# Patient Record
Sex: Male | Born: 1950
Health system: Southern US, Community
[De-identification: ages and names within clinical notes are randomized; demographics above are authoritative.]

## PROBLEM LIST (undated history)

## (undated) DIAGNOSIS — I10 Essential (primary) hypertension: Secondary | ICD-10-CM

## (undated) DIAGNOSIS — E119 Type 2 diabetes mellitus without complications: Secondary | ICD-10-CM

## (undated) DIAGNOSIS — N529 Male erectile dysfunction, unspecified: Secondary | ICD-10-CM

## (undated) DIAGNOSIS — E785 Hyperlipidemia, unspecified: Secondary | ICD-10-CM

## (undated) HISTORY — DX: Essential (primary) hypertension: I10

## (undated) HISTORY — DX: Type 2 diabetes mellitus without complications: E11.9

## (undated) HISTORY — DX: Male erectile dysfunction, unspecified: N52.9

## (undated) HISTORY — DX: Hyperlipidemia, unspecified: E78.5

## (undated) HISTORY — PX: TOOTH EXTRACTION: SHX859

---

## 1997-04-13 HISTORY — PX: APPENDECTOMY: SHX54

## 2014-01-05 DIAGNOSIS — R079 Chest pain, unspecified: Secondary | ICD-10-CM | POA: Insufficient documentation

## 2014-09-25 ENCOUNTER — Ambulatory Visit: Payer: Self-pay

## 2014-10-02 ENCOUNTER — Other Ambulatory Visit: Payer: Self-pay | Admitting: Family Medicine

## 2014-10-02 NOTE — Telephone Encounter (Signed)
Routing to provider  

## 2014-10-02 NOTE — Telephone Encounter (Signed)
E-Fax came through for refill: Rx: Lisinopril Rx copy in basket

## 2014-10-03 MED ORDER — LISINOPRIL 20 MG PO TABS: 40.0000 mg | ORAL_TABLET | Freq: Every day | ORAL | Status: DC

## 2015-03-11 DIAGNOSIS — E1159 Type 2 diabetes mellitus with other circulatory complications: Secondary | ICD-10-CM | POA: Insufficient documentation

## 2015-03-11 DIAGNOSIS — I10 Essential (primary) hypertension: Secondary | ICD-10-CM | POA: Insufficient documentation

## 2015-03-11 DIAGNOSIS — I152 Hypertension secondary to endocrine disorders: Secondary | ICD-10-CM | POA: Insufficient documentation

## 2015-03-11 DIAGNOSIS — E785 Hyperlipidemia, unspecified: Secondary | ICD-10-CM | POA: Insufficient documentation

## 2015-03-11 DIAGNOSIS — E119 Type 2 diabetes mellitus without complications: Secondary | ICD-10-CM | POA: Insufficient documentation

## 2015-03-11 DIAGNOSIS — N529 Male erectile dysfunction, unspecified: Secondary | ICD-10-CM | POA: Insufficient documentation

## 2015-03-14 ENCOUNTER — Ambulatory Visit: Payer: Self-pay | Admitting: Family Medicine

## 2015-03-21 ENCOUNTER — Other Ambulatory Visit: Payer: Self-pay | Admitting: Family Medicine

## 2015-03-21 NOTE — Telephone Encounter (Signed)
rx

## 2015-03-22 MED ORDER — METFORMIN HCL 500 MG PO TABS: 500.0000 mg | ORAL_TABLET | Freq: Every day | ORAL | Status: DC

## 2015-03-22 MED ORDER — AMLODIPINE BESYLATE 5 MG PO TABS: 5.0000 mg | ORAL_TABLET | Freq: Every day | ORAL | Status: DC

## 2015-03-22 NOTE — Telephone Encounter (Signed)
Patient of Dr. Marlise EvesLada's. Patient needs refills on meds, he did schedule an appt.

## 2015-03-22 NOTE — Telephone Encounter (Signed)
Please let Lawrence Jensen know that I'd like to see patient for an appointment here in the office for:  Diabetes, high cholesterol 30 minute visit please Please schedule a visit with me  in the next: month Fasting?  YES, please Thank you, Dr. Sherie DonLada I am sending Rx as requested

## 2015-03-22 NOTE — Telephone Encounter (Signed)
Pt called stated he is completely out of his Amlodopine. Pharm is [facility] on [address] Can someone please send this today. Pt stated he was under the impression the medications would be written for a full year and he needs the RX for the medications written through March. Thanks.

## 2015-04-03 ENCOUNTER — Other Ambulatory Visit: Payer: Self-pay

## 2015-04-03 ENCOUNTER — Ambulatory Visit (INDEPENDENT_AMBULATORY_CARE_PROVIDER_SITE_OTHER): Payer: Self-pay | Admitting: Family Medicine

## 2015-04-03 ENCOUNTER — Encounter: Payer: Self-pay | Admitting: Family Medicine

## 2015-04-03 VITALS — BP 133/78 | HR 74 | Temp 97.9°F | Ht 65.0 in | Wt 180.0 lb

## 2015-04-03 DIAGNOSIS — E785 Hyperlipidemia, unspecified: Secondary | ICD-10-CM

## 2015-04-03 DIAGNOSIS — E119 Type 2 diabetes mellitus without complications: Secondary | ICD-10-CM

## 2015-04-03 DIAGNOSIS — Z5181 Encounter for therapeutic drug level monitoring: Secondary | ICD-10-CM

## 2015-04-03 DIAGNOSIS — I10 Essential (primary) hypertension: Secondary | ICD-10-CM

## 2015-04-03 DIAGNOSIS — Z125 Encounter for screening for malignant neoplasm of prostate: Secondary | ICD-10-CM | POA: Insufficient documentation

## 2015-04-03 NOTE — Assessment & Plan Note (Addendum)
He absolutely refuses statins; I finally talked him into getting lipid panel and starting zetia if needed

## 2015-04-03 NOTE — Patient Instructions (Addendum)
Your goal blood pressure is less than 140 mmHg on top. Try to follow the DASH guidelines (DASH stands for Dietary Approaches to Stop Hypertension) Try to limit the sodium in your diet.  Ideally, consume less than 1.5 grams (less than 1,500mg ) per day. Do not add salt when cooking or at the table.  Check the sodium amount on labels when shopping, and choose items lower in sodium when given a choice. Avoid or limit foods that already contain a lot of sodium. Eat a diet rich in fruits and vegetables and whole grains. Check your feet every day and let me know about any problems Please do see the eye doctor every year  DASH Eating Plan DASH stands for "Dietary Approaches to Stop Hypertension." The DASH eating plan is a healthy eating plan that has been shown to reduce high blood pressure (hypertension). Additional health benefits may include reducing the risk of type 2 diabetes mellitus, heart disease, and stroke. The DASH eating plan may also help with weight loss. WHAT DO I NEED TO KNOW ABOUT THE DASH EATING PLAN? For the DASH eating plan, you will follow these general guidelines:  Choose foods with a percent daily value for sodium of less than 5% (as listed on the food label).  Use salt-free seasonings or herbs instead of table salt or sea salt.  Check with your health care provider or pharmacist before using salt substitutes.  Eat lower-sodium products, often labeled as "lower sodium" or "no salt added."  Eat fresh foods.  Eat more vegetables, fruits, and low-fat dairy products.  Choose whole grains. Look for the word "whole" as the first word in the ingredient list.  Choose fish and skinless chicken or [place] more often than red meat. Limit fish, poultry, and meat to 6 oz (170 g) each day.  Limit sweets, desserts, sugars, and sugary drinks.  Choose heart-healthy fats.  Limit cheese to 1 oz (28 g) per day.  Eat more home-cooked food and less restaurant, buffet, and fast  food.  Limit fried foods.  Cook foods using methods other than frying.  Limit canned vegetables. If you do use them, rinse them well to decrease the sodium.  When eating at a restaurant, ask that your food be prepared with less salt, or no salt if possible. WHAT FOODS CAN I EAT? Seek help from a dietitian for individual calorie needs. Grains Whole grain or whole wheat bread. Brown rice. Whole grain or whole wheat pasta. Quinoa, bulgur, and whole grain cereals. Low-sodium cereals. Corn or whole wheat flour tortillas. Whole grain cornbread. Whole grain crackers. Low-sodium crackers. Vegetables Fresh or frozen vegetables (raw, steamed, roasted, or grilled). Low-sodium or reduced-sodium tomato and vegetable juices. Low-sodium or reduced-sodium tomato sauce and paste. Low-sodium or reduced-sodium canned vegetables.  Fruits All fresh, canned (in natural juice), or frozen fruits. Meat and Other Protein Products Ground beef (85% or leaner), grass-fed beef, or beef trimmed of fat. Skinless chicken or [place]. Ground chicken or [place]. Pork trimmed of fat. All fish and seafood. Eggs. Dried beans, peas, or lentils. Unsalted nuts and seeds. Unsalted canned beans. Dairy Low-fat dairy products, such as skim or 1% milk, 2% or reduced-fat cheeses, low-fat ricotta or cottage cheese, or plain low-fat yogurt. Low-sodium or reduced-sodium cheeses. Fats and Oils Tub margarines without trans fats. Light or reduced-fat mayonnaise and salad dressings (reduced sodium). Avocado. Safflower, olive, or canola oils. Natural peanut or almond butter. Other Unsalted popcorn and pretzels. The items listed above may not be a complete list  of recommended foods or beverages. Contact your dietitian for more options. WHAT FOODS ARE NOT RECOMMENDED? Grains White bread. White pasta. White rice. Refined cornbread. Bagels and croissants. Crackers that contain trans fat. Vegetables Creamed or fried vegetables. Vegetables in a  cheese sauce. Regular canned vegetables. Regular canned tomato sauce and paste. Regular tomato and vegetable juices. Fruits Dried fruits. Canned fruit in light or heavy syrup. Fruit juice. Meat and Other Protein Products Fatty cuts of meat. Ribs, chicken wings, bacon, sausage, bologna, salami, chitterlings, fatback, hot dogs, bratwurst, and packaged luncheon meats. Salted nuts and seeds. Canned beans with salt. Dairy Whole or 2% milk, cream, half-and-half, and cream cheese. Whole-fat or sweetened yogurt. Full-fat cheeses or blue cheese. Nondairy creamers and whipped toppings. Processed cheese, cheese spreads, or cheese curds. Condiments Onion and garlic salt, seasoned salt, table salt, and sea salt. Canned and packaged gravies. Worcestershire sauce. Tartar sauce. Barbecue sauce. Teriyaki sauce. Soy sauce, including reduced sodium. Steak sauce. Fish sauce. Oyster sauce. Cocktail sauce. Horseradish. Ketchup and mustard. Meat flavorings and tenderizers. Bouillon cubes. Hot sauce. Tabasco sauce. Marinades. Taco seasonings. Relishes. Fats and Oils Butter, stick margarine, lard, shortening, ghee, and bacon fat. Coconut, palm kernel, or palm oils. Regular salad dressings. Other Pickles and olives. Salted popcorn and pretzels. The items listed above may not be a complete list of foods and beverages to avoid. Contact your dietitian for more information. WHERE CAN I FIND MORE INFORMATION? National Heart, Lung, and Blood Institute: [employer]   This information is not intended to replace advice given to you by your health care provider. Make sure you discuss any questions you have with your health care provider.   Document Released: 03/19/2011 Document Revised: 04/20/2014 Document Reviewed: 02/01/2013 Elsevier Interactive Patient Education 2016 Elsevier Inc. Cholesterol Cholesterol is a fat. Your body needs a small amount of cholesterol. Cholesterol may build up  in your blood vessels. This increases your chance of having a heart attack or stroke. You cannot feel your cholesterol levels. The only way to know your cholesterol level is high is with a blood test. Keep your test results. Work with your doctor to keep your cholesterol at a good level. WHAT DO THE TEST RESULTS MEAN?  Total cholesterol is how much cholesterol is in your blood.  LDL is bad cholesterol. This is the type that can build up. You want LDL to be low.  HDL is good cholesterol. It cleans your blood vessels and carries LDL away. You want HDL to be high.  Triglycerides are fat that the body can burn for energy or store. WHAT ARE GOOD LEVELS OF CHOLESTEROL?  Total cholesterol below 200.  LDL below 100 for people at risk. Below 70 for those at very high risk.  HDL above 50 is good. Above 60 is best.  Triglycerides below 150. HOW CAN I LOWER MY CHOLESTEROL?  Diet. Follow your diet programs as told by your doctor.  Choose fish, white meat chicken, roasted [place], or baked [place]. Try not to eat red meat, fried foods, or processed meats such as sausage and lunch meats.  Eat lots of fresh fruits and vegetables.  Choose whole grains, beans, pasta, potatoes, and cereals.  Use only small amounts of olive, corn, or canola oils.  Try not to eat butter, mayonnaise, shortening, or palm kernel oils.  Try not to eat foods with trans fats.  Drink skim or nonfat milk. Eat low-fat or nonfat yogurt and cheeses. Try not to drink whole milk or cream. Try not to  eat ice cream, egg yolks, and full-fat cheeses.  Healthy desserts include angel food cake, ginger snaps, animal crackers, hard candy, popsicles, and low-fat or nonfat frozen yogurt. Try not to eat pastries, cakes, pies, and cookies.  Exercise. Follow your exercise programs as told by your doctor.  Be more active. You can try gardening, walking, or taking the stairs. Ask your doctor about how you can be more active.  Medicine.  Take medicine as told by your doctor.   This information is not intended to replace advice given to you by your health care provider. Make sure you discuss any questions you have with your health care provider.   Document Released: 06/26/2008 Document Revised: 04/20/2014 Document Reviewed: 01/11/2013 Elsevier Interactive Patient Education [employer]

## 2015-04-03 NOTE — Assessment & Plan Note (Signed)
BP at goal, systolic less than 140; continue CCB and ACE-I; try DASH guidelines

## 2015-04-03 NOTE — Assessment & Plan Note (Signed)
Check creatinine 

## 2015-04-03 NOTE — Progress Notes (Signed)
BP 133/78 mmHg  Pulse 74  Temp(Src) 97.9 F (36.6 C)  Ht  (1.651 m)  Wt 180 lb (81.647 kg)  BMI 29.95 kg/m2  SpO2 98%   Subjective:    Patient ID: Lawrence Jensen, male    DOB: 10-May-1950, 64 y.o.   MRN: [id number]  HPI: Lawrence Jensen is a 64 y.o. male  Chief Complaint  Patient presents with  . Hypertension    he's just here for a follow up, he does not want any labs done  . Hyperlipidemia  . 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 His father had prostate cancer and he saw what he went through and doesn't want to go through that. Interim medical history since our last visit reviewed. Allergies and medications reviewed and updated.  Review of Systems  Constitutional: Negative for unexpected weight change (he weighs 1-2 x a week, not losing weight unexpectedly).  Cardiovascular: Negative for leg swelling.  Endocrine: Negative for polydipsia and polyuria.  Genitourinary: Negative for difficulty urinating.  Neurological: Negative for numbness.   Per HPI unless specifically indicated above     Objective:    BP 133/78 mmHg  Pulse 74  Temp(Src) 97.9 F (36.6 C)  Ht  (1.651 m)  Wt 180 lb (81.647 kg)  BMI 29.95 kg/m2  SpO2 98%  Wt Readings from Last 3 Encounters:  04/03/15 180 lb (81.647 kg)  08/23/14 187 lb (84.823 kg)    Physical Exam  Constitutional: He appears well-developed and well-nourished. No distress.  HENT:  Head:  Normocephalic and atraumatic.  Eyes: EOM are normal. No scleral icterus.  Neck: No thyromegaly present.  Cardiovascular: Normal rate and regular rhythm.   Pulmonary/Chest: Effort normal and breath sounds normal.  Abdominal: Soft. Bowel sounds are normal. He exhibits no distension.  Musculoskeletal: He exhibits no edema.  Neurological: Coordination normal.  Skin: Skin is warm and dry. No pallor.  Psychiatric: He has a normal mood and affect. His behavior is normal. Judgment and thought content normal.   Diabetic Foot Form - Detailed   Diabetic Foot Exam - detailed  Diabetic Foot exam was performed with the following findings:  Yes 04/03/2015  9:51 AM  Visual Foot Exam completed.:  Yes  Are the toenails long?:  No  Are the toenails thick?:  No  Is there foot or ankle muscle weakness?:  No  Are the toenails ingrown?:  No  Normal Range of Motion:  Yes    Pulse Foot Exam completed.:  Yes  Right Dorsalis Pedis:  Present Left Dorsalis Pedis:  Present  Sensory Foot Exam Completed.:  Yes  Semmes-Weinstein Monofilament Test  R Site 1-Great Toe:  Pos L Site 1-Great Toe:  Pos  R Site 4:  Pos L Site 4:  Pos   L Site 5:  Pos          Assessment & Plan:   Problem List  Items Addressed This Visit      Cardiovascular and Mediastinum   Hypertension - Primary    BP at goal, systolic less than 140; continue CCB and ACE-I; try DASH guidelines        Endocrine   Diabetes mellitus without complication (HCC)    I finally talked patient into the A1c and glucose; aspirin, ACE-I; he refuses statins completely      Relevant Orders   Hgb A1c w/o eAG   Microalbumin / creatinine urine ratio     Other   Hyperlipidemia    He absolutely refuses statins; I finally talked him into getting lipid panel and starting zetia if needed      Relevant Orders   Lipid Panel w/o Chol/HDL Ratio   Medication monitoring encounter    Check creatinine      Relevant Orders   Comprehensive metabolic panel       Follow up plan: Return in about 6 months (around 10/02/2015) for 30 minutes, fasting, diabetes, cholesterol, BP.   Patient declined psa, offered today  An after-visit summary was printed and given to the patient at check-out.  Please see the patient instructions which may contain other information and recommendations beyond what is mentioned above in the assessment and plan. Orders Placed This Encounter  Procedures  . Hgb A1c w/o eAG  . Lipid Panel w/o Chol/HDL Ratio  . Comprehensive metabolic panel  . Microalbumin / creatinine urine ratio

## 2015-04-03 NOTE — Assessment & Plan Note (Signed)
I finally talked patient into the A1c and glucose; aspirin, ACE-I; he refuses statins completely

## 2015-04-03 NOTE — Telephone Encounter (Signed)
He needs refills on his meds

## 2015-04-04 LAB — COMPREHENSIVE METABOLIC PANEL
ALBUMIN: 4.5 g/dL (ref 3.6–4.8)
ALT: 17 IU/L (ref 0–44)
AST: 20 IU/L (ref 0–40)
Albumin/Globulin Ratio: 1.7 (ref 1.1–2.5)
Alkaline Phosphatase: 86 IU/L (ref 39–117)
BUN / CREAT RATIO: 12 (ref 10–22)
BUN: 13 mg/dL (ref 8–27)
Bilirubin Total: 0.6 mg/dL (ref 0.0–1.2)
CO2: 27 mmol/L (ref 18–29)
CREATININE: 1.07 mg/dL (ref 0.76–1.27)
Calcium: 10.1 mg/dL (ref 8.6–10.2)
Chloride: 99 mmol/L (ref 96–106)
GFR calc non Af Amer: 73 mL/min/{1.73_m2} (ref >59)
GFR, EST AFRICAN AMERICAN: 85 mL/min/{1.73_m2} (ref >59)
GLUCOSE: 137 mg/dL — AB (ref 65–99)
Globulin, Total: 2.6 g/dL (ref 1.5–4.5)
Potassium: 4.6 mmol/L (ref 3.5–5.2)
Sodium: 139 mmol/L (ref 134–144)
TOTAL PROTEIN: 7.1 g/dL (ref 6.0–8.5)

## 2015-04-04 LAB — LIPID PANEL W/O CHOL/HDL RATIO
Cholesterol, Total: 192 mg/dL (ref 100–199)
HDL: 35 mg/dL — ABNORMAL LOW (ref >39)
LDL CALC: 115 mg/dL — AB (ref 0–99)
Triglycerides: 211 mg/dL — ABNORMAL HIGH (ref 0–149)
VLDL CHOLESTEROL CAL: 42 mg/dL — AB (ref 5–40)

## 2015-04-04 LAB — MICROALBUMIN / CREATININE URINE RATIO
CREATININE, UR: 53.9 mg/dL
Microalbumin, Urine: <3 ug/mL

## 2015-04-04 LAB — HGB A1C W/O EAG: Hgb A1c MFr Bld: 6.3 % — ABNORMAL HIGH (ref 4.8–5.6)

## 2015-04-04 MED ORDER — EZETIMIBE 10 MG PO TABS: 10.0000 mg | ORAL_TABLET | Freq: Every day | ORAL | Status: DC

## 2015-04-04 MED ORDER — LISINOPRIL 20 MG PO TABS: 40.0000 mg | ORAL_TABLET | Freq: Every day | ORAL | Status: DC

## 2015-04-04 MED ORDER — METFORMIN HCL 500 MG PO TABS: 500.0000 mg | ORAL_TABLET | Freq: Every day | ORAL | Status: DC

## 2015-04-04 MED ORDER — AMLODIPINE BESYLATE 5 MG PO TABS: 5.0000 mg | ORAL_TABLET | Freq: Every day | ORAL | Status: DC

## 2015-04-04 NOTE — Telephone Encounter (Signed)
Labs reviewed; Rxs approved and zetia started; new, he refuses statin

## 2015-05-29 ENCOUNTER — Other Ambulatory Visit: Payer: Self-pay | Admitting: Family Medicine

## 2015-05-29 NOTE — Telephone Encounter (Signed)
Dec 2016 labs reviewed; Rxs approved

## 2015-06-26 ENCOUNTER — Telehealth: Payer: Self-pay | Admitting: Family Medicine

## 2015-06-26 NOTE — Telephone Encounter (Signed)
Spoke with pharmacy, they do have his rx.

## 2015-06-26 NOTE — Telephone Encounter (Signed)
Pt called stated pharmacy has not received an RX for his Lisinopril. Pharm is [facility] on [address]. Thanks.

## 2015-06-26 NOTE — Telephone Encounter (Signed)
Left message on patient's identified voicemail that the pharmacy does have his rx.

## 2015-07-01 ENCOUNTER — Ambulatory Visit: Payer: Self-pay | Admitting: Unknown Physician Specialty

## 2015-07-01 ENCOUNTER — Encounter: Payer: Self-pay | Admitting: Unknown Physician Specialty

## 2015-07-01 VITALS — BP 141/86 | HR 68 | Temp 98.7°F | Ht 64.7 in | Wt 182.4 lb

## 2015-07-01 NOTE — Progress Notes (Signed)
BP 141/86 mmHg  Pulse 68  Temp(Src) 98.7 F (37.1 C)  Ht 5' 4.7" (1.643 m)  Wt 182 lb 6.4 oz (82.736 kg)  BMI 30.65 kg/m2  SpO2 98%   Subjective:    Patient ID: Lawrence Jensen, male    DOB: December 25, 1950, 65 y.o.   MRN: [id number]  HPI: Lawrence PickDavid Clark Drummer is a 65 y.o. male  Chief Complaint  Patient presents with  . Employment Physical    DOT Physical   Reviewed chart.  Last Hgb A1C was 6.3.  Compliant with chronic disease follow up with Dr. Sherie DonLada.  -  Relevant past medical, surgical, family and social history reviewed and updated as indicated. Interim medical history since our last visit reviewed. Allergies and medications reviewed and updated.  Review of Systems  Per HPI unless specifically indicated above     Objective:    BP 141/86 mmHg  Pulse 68  Temp(Src) 98.7 F (37.1 C)  Ht 5' 4.7" (1.643 m)  Wt 182 lb 6.4 oz (82.736 kg)  BMI 30.65 kg/m2  SpO2 98%  Wt Readings from Last 3 Encounters:  07/01/15 182 lb 6.4 oz (82.736 kg)  04/03/15 180 lb (81.647 kg)  08/23/14 187 lb (84.823 kg)    Physical Exam  Results for orders placed or performed in visit on 04/03/15  Hgb A1c w/o eAG  Result Value Ref Range   Hgb A1c MFr Bld 6.3 (H) 4.8 - 5.6 %  Lipid Panel w/o Chol/HDL Ratio  Result Value Ref Range   Cholesterol, Total 192 100 - 199 mg/dL   Triglycerides 409211 (H) 0 - 149 mg/dL   HDL 35 (L) >81>39 mg/dL   VLDL Cholesterol Cal 42 (H) 5 - 40 mg/dL   LDL Calculated 191115 (H) 0 - 99 mg/dL  Comprehensive metabolic panel  Result Value Ref Range   Glucose 137 (H) 65 - 99 mg/dL   BUN 13 8 - 27 mg/dL   Creatinine, Ser 4.781.07 0.76 - 1.27 mg/dL   GFR calc non Af Amer 73 >59 mL/min/1.73   GFR calc Af Amer 85 >59 mL/min/1.73   BUN/Creatinine Ratio 12 10 - 22   Sodium 139 134 - 144 mmol/L   Potassium 4.6 3.5 - 5.2 mmol/L   Chloride 99 96 - 106 mmol/L   CO2 27 18 - 29 mmol/L   Calcium 10.1 8.6 - 10.2 mg/dL   Total Protein 7.1 6.0 - 8.5 g/dL   Albumin 4.5 3.6 - 4.8 g/dL    Globulin, Total 2.6 1.5 - 4.5 g/dL   Albumin/Globulin Ratio 1.7 1.1 - 2.5   Bilirubin Total 0.6 0.0 - 1.2 mg/dL   Alkaline Phosphatase 86 39 - 117 IU/L   AST 20 0 - 40 IU/L   ALT 17 0 - 44 IU/L  Microalbumin / creatinine urine ratio  Result Value Ref Range   Creatinine, Urine 53.9 Not Estab. mg/dL   Microalbum.,U,Random <3.0 Not Estab. ug/mL   MICROALB/CREAT RATIO <5.6 0.0 - 30.0 mg/g creat      Assessment & Plan:   Problem List Items Addressed This Visit    None    Visit Diagnoses    Routine general medical examination at a health care facility    -  Primary    Relevant Orders    Urinalysis, dipstick only       Pt did not take BP meds today and on recheck BP is higher.  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

## 2015-07-03 ENCOUNTER — Ambulatory Visit (INDEPENDENT_AMBULATORY_CARE_PROVIDER_SITE_OTHER): Payer: Self-pay | Admitting: Unknown Physician Specialty

## 2015-07-03 ENCOUNTER — Encounter: Payer: Self-pay | Admitting: Unknown Physician Specialty

## 2015-07-03 VITALS — BP 130/69 | HR 58 | Temp 99.0°F | Ht 64.7 in | Wt 182.4 lb

## 2015-07-03 DIAGNOSIS — Z0000 Encounter for general adult medical examination without abnormal findings: Secondary | ICD-10-CM

## 2015-07-03 LAB — URINALYSIS, DIPSTICK ONLY
BILIRUBIN UA: NEGATIVE
Glucose, UA: NEGATIVE
KETONES UA: NEGATIVE
NITRITE UA: NEGATIVE
Protein, UA: NEGATIVE
RBC UA: NEGATIVE
SPEC GRAV UA: 1.01 (ref 1.005–1.030)
UUROB: 0.2 mg/dL (ref 0.2–1.0)
pH, UA: 7 (ref 5.0–7.5)

## 2015-07-04 NOTE — Progress Notes (Signed)
   BP 130/69 mmHg  Pulse 58  Temp(Src) 99 F (37.2 C)  Ht 5' 4.7" (1.643 m)  Wt 182 lb 6.4 oz (82.736 kg)  BMI 30.65 kg/m2   Subjective:    Patient ID: Lawrence Jensen, male    DOB: [birth year], 65 y.o.   MRN: [id number]  HPI: Lawrence Jensen is a 65 y.o. male  Chief Complaint  Patient presents with  . Employment Physical    DOT Physical   See form for details Relevant past medical, surgical, family and social history reviewed and updated as indicated. Interim medical history since our last visit reviewed. Allergies and medications reviewed and updated.  Review of Systems  Per HPI unless specifically indicated above     Objective:    BP 130/69 mmHg  Pulse 58  Temp(Src) 99 F (37.2 C)  Ht 5' 4.7" (1.643 m)  Wt 182 lb 6.4 oz (82.736 kg)  BMI 30.65 kg/m2  Wt Readings from Last 3 Encounters:  07/03/15 182 lb 6.4 oz (82.736 kg)  07/01/15 182 lb 6.4 oz (82.736 kg)  04/03/15 180 lb (81.647 kg)    Physical Exam  Constitutional: He is oriented to person, place, and time. He appears well-developed and well-nourished.  HENT:  Head: Normocephalic.  Right Ear: Tympanic membrane, external ear and ear canal normal.  Left Ear: Tympanic membrane, external ear and ear canal normal.  Mouth/Throat: Uvula is midline, oropharynx is clear and moist and mucous membranes are normal.  Eyes: Pupils are equal, round, and reactive to light.  Cardiovascular: Normal rate, regular rhythm and normal heart sounds.  Exam reveals no gallop and no friction rub.   No murmur heard. Pulmonary/Chest: Effort normal and breath sounds normal. No respiratory distress.  Abdominal: Soft. Bowel sounds are normal. He exhibits no distension. There is no tenderness.  Musculoskeletal: Normal range of motion.  Neurological: He is alert and oriented to person, place, and time. He has normal reflexes.  Skin: Skin is warm and dry.  Psychiatric: He has a normal mood and affect. His behavior is normal.  Judgment and thought content normal.    Results for orders placed or performed in visit on 07/03/15  Urinalysis, dipstick only  Result Value Ref Range   Specific Gravity, UA 1.010 1.005 - 1.030   pH, UA 7.0 5.0 - 7.5   Color, UA Yellow Yellow   Appearance Ur Clear Clear   Leukocytes, UA 1+ (A) Negative   Protein, UA Negative Negative/Trace   Glucose, UA Negative Negative   Ketones, UA Negative Negative   RBC, UA Negative Negative   Bilirubin, UA Negative Negative   Urobilinogen, Ur 0.2 0.2 - 1.0 mg/dL   Nitrite, UA Negative Negative      Assessment & Plan:   Problem List Items Addressed This Visit    None    Visit Diagnoses    Routine general medical examination at a health care facility    -  Primary    Relevant Orders    Urinalysis, dipstick only (Completed)        Follow up plan: Return in about 3 months (around 10/03/2015).

## 2015-08-07 ENCOUNTER — Encounter: Payer: Self-pay | Admitting: Unknown Physician Specialty

## 2015-08-19 ENCOUNTER — Telehealth: Payer: Self-pay

## 2015-08-19 NOTE — Telephone Encounter (Signed)
07/01/15 should have been a no charge visit.  I don't think I was managing his refills at the time.  Is there something I can do to help with that?

## 2015-08-19 NOTE — Telephone Encounter (Signed)
Routing to provider  

## 2015-08-19 NOTE — Telephone Encounter (Signed)
Patient called to advise that he is receiving a bill for his DOT physical although he paid for the physical.  It appears that patient was inadvertently charged for a DOT on 07/01/15 and 07/03/15.  Pt also expressed displeasure with the way his RX refills were handled.  I apologized to the patient for his experience and explained that I would send a message to the provider requesting permission to w/o the charge for DOS 07/03/15. Please review and advise.

## 2015-10-02 ENCOUNTER — Ambulatory Visit: Payer: Self-pay | Admitting: Family Medicine

## 2015-10-29 ENCOUNTER — Ambulatory Visit: Payer: Self-pay | Admitting: Unknown Physician Specialty

## 2015-11-01 ENCOUNTER — Encounter: Payer: Self-pay | Admitting: Unknown Physician Specialty

## 2015-11-01 ENCOUNTER — Ambulatory Visit (INDEPENDENT_AMBULATORY_CARE_PROVIDER_SITE_OTHER): Payer: Self-pay | Admitting: Unknown Physician Specialty

## 2015-11-01 VITALS — BP 125/68 | HR 57 | Temp 97.8°F | Ht 65.2 in | Wt 180.2 lb

## 2015-11-01 DIAGNOSIS — E119 Type 2 diabetes mellitus without complications: Secondary | ICD-10-CM

## 2015-11-01 DIAGNOSIS — E785 Hyperlipidemia, unspecified: Secondary | ICD-10-CM

## 2015-11-01 DIAGNOSIS — I10 Essential (primary) hypertension: Secondary | ICD-10-CM

## 2015-11-01 LAB — LIPID PANEL PICCOLO, WAIVED
CHOL/HDL RATIO PICCOLO,WAIVE: 4.6 mg/dL
Cholesterol Piccolo, Waived: 205 mg/dL — ABNORMAL HIGH (ref <200)
HDL Chol Piccolo, Waived: 45 mg/dL — ABNORMAL LOW (ref >59)
LDL Chol Calc Piccolo Waived: 128 mg/dL — ABNORMAL HIGH (ref <100)
TRIGLYCERIDES PICCOLO,WAIVED: 160 mg/dL — AB (ref <150)
VLDL CHOL CALC PICCOLO,WAIVE: 32 mg/dL — AB (ref <30)

## 2015-11-01 LAB — BAYER DCA HB A1C WAIVED: HB A1C: 6.4 % (ref <7.0)

## 2015-11-01 MED ORDER — LISINOPRIL 20 MG PO TABS: 40.0000 mg | ORAL_TABLET | Freq: Every day | ORAL | Status: DC

## 2015-11-01 MED ORDER — AMLODIPINE BESYLATE 5 MG PO TABS: 5.0000 mg | ORAL_TABLET | Freq: Every day | ORAL | Status: DC

## 2015-11-01 MED ORDER — METFORMIN HCL 500 MG PO TABS: 500.0000 mg | ORAL_TABLET | Freq: Every day | ORAL | Status: DC

## 2015-11-01 NOTE — Patient Instructions (Addendum)

## 2015-11-01 NOTE — Progress Notes (Signed)
BP 125/68 mmHg  Pulse 57  Temp(Src) 97.8 F (36.6 C)  Ht 5' 5.2" (1.656 m)  Wt 180 lb 3.2 oz (81.738 kg)  BMI 29.81 kg/m2  SpO2 96%   Subjective:    Patient ID: Lawrence Jensen, male    DOB: 20-Sep-1950, 65 y.o.   MRN: [id number]  HPI: Lawrence Jensen is a 65 y.o. male  Chief Complaint  Patient presents with  . Diabetes    pt states he has not had an eye exam in a few years   . Hypertension   Diabetes: Diet controlled and taking Metformin without problems Feet problems:None Blood Sugars averaging: Does not check eye exam within last year Last Hgb A1C: 6.3  Hypertension  Using medications without difficulty Average home BPs Not checking  Using medication without problems or lightheadedness No chest pain with exertion or shortness of breath No Edema  Elevated Cholesterol Using medications without problems No Muscle aches  Diet: regular Exercise: work   Relevant past medical, surgical, family and social history reviewed and updated as indicated. Interim medical history since our last visit reviewed. Allergies and medications reviewed and updated.  Review of Systems  Per HPI unless specifically indicated above     Objective:    BP 125/68 mmHg  Pulse 57  Temp(Src) 97.8 F (36.6 C)  Ht 5' 5.2" (1.656 m)  Wt 180 lb 3.2 oz (81.738 kg)  BMI 29.81 kg/m2  SpO2 96%  Wt Readings from Last 3 Encounters:  11/01/15 180 lb 3.2 oz (81.738 kg)  07/03/15 182 lb 6.4 oz (82.736 kg)  07/01/15 182 lb 6.4 oz (82.736 kg)    Physical Exam  Constitutional: He is oriented to person, place, and time. He appears well-developed and well-nourished. No distress.  HENT:  Head: Normocephalic and atraumatic.  Eyes: Conjunctivae and lids are normal. Right eye exhibits no discharge. Left eye exhibits no discharge. No scleral icterus.  Neck: Normal range of motion. Neck supple. No JVD present. Carotid bruit is not present.  Cardiovascular: Normal rate, regular rhythm and  normal heart sounds.   Pulmonary/Chest: Effort normal and breath sounds normal. No respiratory distress.  Abdominal: Normal appearance. There is no splenomegaly or hepatomegaly.  Musculoskeletal: Normal range of motion.  Neurological: He is alert and oriented to person, place, and time.  Skin: Skin is warm, dry and intact. No rash noted. No pallor.  Psychiatric: He has a normal mood and affect. His behavior is normal. Judgment and thought content normal.      Assessment & Plan:   Problem List Items Addressed This Visit      Unprioritized   Diabetes mellitus without complication (HCC) - Primary    Hgb A1C is 6.4      Relevant Medications   metFORMIN (GLUCOPHAGE) 500 MG tablet   lisinopril (PRINIVIL,ZESTRIL) 20 MG tablet   Other Relevant Orders   Comprehensive metabolic panel   Bayer DCA Hb W4XA1c Waived   Hyperlipidemia    LDL is 128.  Statin intolerant.  Continue diet.  Refusing Zetia      Relevant Medications   lisinopril (PRINIVIL,ZESTRIL) 20 MG tablet   amLODipine (NORVASC) 5 MG tablet   Other Relevant Orders   Lipid Panel Piccolo, Waived   Hypertension    Stable, continue present medications.        Relevant Medications   lisinopril (PRINIVIL,ZESTRIL) 20 MG tablet   amLODipine (NORVASC) 5 MG tablet   Other Relevant Orders   Comprehensive metabolic panel  Follow up plan: Return in about 6 months (around 05/03/2016).

## 2015-11-01 NOTE — Assessment & Plan Note (Signed)
LDL is 128.  Statin intolerant.  Continue diet.  Refusing Zetia

## 2015-11-01 NOTE — Assessment & Plan Note (Signed)
Hgb A1C is 6.4

## 2015-11-01 NOTE — Assessment & Plan Note (Signed)
Stable, continue present medications.   

## 2015-11-02 LAB — COMPREHENSIVE METABOLIC PANEL
ALBUMIN: 4.4 g/dL (ref 3.6–4.8)
ALK PHOS: 89 IU/L (ref 39–117)
ALT: 19 IU/L (ref 0–44)
AST: 18 IU/L (ref 0–40)
Albumin/Globulin Ratio: 1.6 (ref 1.2–2.2)
BILIRUBIN TOTAL: 0.6 mg/dL (ref 0.0–1.2)
BUN/Creatinine Ratio: 16 (ref 10–24)
BUN: 22 mg/dL (ref 8–27)
CHLORIDE: 97 mmol/L (ref 96–106)
CO2: 22 mmol/L (ref 18–29)
CREATININE: 1.35 mg/dL — AB (ref 0.76–1.27)
Calcium: 10 mg/dL (ref 8.6–10.2)
GFR calc non Af Amer: 55 mL/min/{1.73_m2} — ABNORMAL LOW (ref >59)
GFR, EST AFRICAN AMERICAN: 64 mL/min/{1.73_m2} (ref >59)
GLUCOSE: 146 mg/dL — AB (ref 65–99)
Globulin, Total: 2.7 g/dL (ref 1.5–4.5)
Potassium: 4.9 mmol/L (ref 3.5–5.2)
Sodium: 138 mmol/L (ref 134–144)
TOTAL PROTEIN: 7.1 g/dL (ref 6.0–8.5)

## 2015-11-04 ENCOUNTER — Encounter: Payer: Self-pay | Admitting: Unknown Physician Specialty

## 2016-03-16 ENCOUNTER — Telehealth: Payer: Self-pay | Admitting: Unknown Physician Specialty

## 2016-03-16 NOTE — Telephone Encounter (Signed)
Pt called stated he has questions about possibly increasing the dosage of his Metformin. Pt stated his blood sugar is creeping up. Stated his blood sugar is over 170 even with taking 2 500 mg Metformin per day. Please call pt back ASAP. Thanks.

## 2016-03-17 NOTE — Telephone Encounter (Signed)
Called and spoke to patient. He states that in the past we has taking 2 tablets of metformin per day but has doing well with that so he was taken down to 1 tablet per day. Patient states this was working for him for a while but his BS has recently been climbing up around 150 to 170 so he started taking 2 tablets of metformin again. Patient states he did splurge a little at Thanksgiving and his BS went up to 190 but has come back down and stayed around 170 since then. Patient wants to know if he needs to take more than 2 tablets per day or what he should do. I let the patient know that Elnita MaxwellCheryl was out of the office today and that she would get back to me tomorrow.

## 2016-03-18 NOTE — Telephone Encounter (Signed)
I would keep at 2 tablets for now

## 2016-03-18 NOTE — Telephone Encounter (Signed)
Called and let patient know what [name] said.  

## 2016-05-04 ENCOUNTER — Encounter: Payer: Self-pay | Admitting: Unknown Physician Specialty

## 2016-05-04 ENCOUNTER — Ambulatory Visit (INDEPENDENT_AMBULATORY_CARE_PROVIDER_SITE_OTHER): Payer: Self-pay | Admitting: Unknown Physician Specialty

## 2016-05-04 VITALS — BP 128/77 | HR 61 | Temp 97.5°F | Ht 66.2 in | Wt 182.8 lb

## 2016-05-04 DIAGNOSIS — E782 Mixed hyperlipidemia: Secondary | ICD-10-CM

## 2016-05-04 DIAGNOSIS — E119 Type 2 diabetes mellitus without complications: Secondary | ICD-10-CM

## 2016-05-04 DIAGNOSIS — I10 Essential (primary) hypertension: Secondary | ICD-10-CM

## 2016-05-04 LAB — BAYER DCA HB A1C WAIVED: HB A1C (BAYER DCA - WAIVED): 6.1 % (ref <7.0)

## 2016-05-04 MED ORDER — METFORMIN HCL 500 MG PO TABS: 500.0000 mg | ORAL_TABLET | Freq: Two times a day (BID) | ORAL | 1 refills | Status: DC

## 2016-05-04 MED ORDER — AMLODIPINE BESYLATE 5 MG PO TABS: 5.0000 mg | ORAL_TABLET | Freq: Every day | ORAL | 1 refills | Status: DC

## 2016-05-04 MED ORDER — LISINOPRIL 20 MG PO TABS: 40.0000 mg | ORAL_TABLET | Freq: Every day | ORAL | 1 refills | Status: DC

## 2016-05-04 NOTE — Progress Notes (Signed)
BP 128/77 (BP Location: Left Arm, Patient Position: Sitting, Cuff Size: Large)   Pulse 61   Temp 97.5 F (36.4 C)   Ht 5' 6.2" (1.681 m) Comment: pt had boots on  Wt 182 lb 12.8 oz (82.9 kg) Comment: pt had boots on  SpO2 97%   BMI 29.33 kg/m    Subjective:    Patient ID: Lawrence Jensen, male    DOB: 1950-12-12, 66 y.o.   MRN: [id number]  HPI: Lawrence Jensen is a 66 y.o. male   Chief Complaint  Patient presents with  . Hyperlipidemia  . Hypertension  . Diabetes    pt states it has been a while since he had an eye exam   Hypertension Taking medications without difficulty. Reports that he checks his blood pressure at home a couple of times a week, with measurements usually in the 120s/70-80 mmHg. He notes that he has not been exercising recently due to the cold weather. Enquired about potentially stopping the amlodipine due to his current measurements.   Diabetes Taking medications without difficulty. Reports that he attempts to keep his blood glucose levels below 150 mg/dL and checks them 3-4 times a week with measurements usually in the 120s. Notes that his measurements started to go up around Thanksgiving and he was having difficulty lowering them, so he started taking his metformin twice a day instead of once a day and has not had trouble since this time. Admits that he was not eating as well around the holidays, but has been better since the new year started.  Hyperlipidemia Patient refuses statin therapy.   Relevant past medical, surgical, family and social history reviewed and updated as indicated. Interim medical history since our last visit reviewed. Allergies and medications reviewed and updated.  Review of Systems  Constitutional: Negative for appetite change, fatigue and unexpected weight change.  HENT: Negative.   Eyes: Negative for pain, redness and visual disturbance.  Respiratory: Negative for cough, chest tightness and wheezing.   Cardiovascular:  Negative for chest pain, palpitations and leg swelling.  Gastrointestinal: Negative for abdominal pain, constipation, diarrhea and nausea.  Endocrine: Negative for polydipsia and polyuria.  Genitourinary: Negative for difficulty urinating, dysuria and frequency.  Musculoskeletal: Negative.   Skin: Negative.   Allergic/Immunologic: Negative.   Neurological: Negative for dizziness, light-headedness, numbness and headaches.  Psychiatric/Behavioral: Negative.     Per HPI unless specifically indicated above     Objective:    BP 128/77 (BP Location: Left Arm, Patient Position: Sitting, Cuff Size: Large)   Pulse 61   Temp 97.5 F (36.4 C)   Ht 5' 6.2" (1.681 m) Comment: pt had boots on  Wt 182 lb 12.8 oz (82.9 kg) Comment: pt had boots on  SpO2 97%   BMI 29.33 kg/m   Wt Readings from Last 3 Encounters:  05/04/16 182 lb 12.8 oz (82.9 kg)  11/01/15 180 lb 3.2 oz (81.7 kg)  07/03/15 182 lb 6.4 oz (82.7 kg)    Physical Exam  Constitutional: He is oriented to person, place, and time. He appears well-developed and well-nourished. No distress.  HENT:  Head: Normocephalic.  Eyes: Conjunctivae are normal. Right eye exhibits no discharge. Left eye exhibits no discharge. No scleral icterus.  Cardiovascular: Normal rate, regular rhythm, normal heart sounds and intact distal pulses.  Exam reveals no gallop and no friction rub.   No murmur heard. Pulmonary/Chest: Effort normal and breath sounds normal. No respiratory distress. He has no wheezes.  Neurological: He is  alert and oriented to person, place, and time.  Skin: Skin is warm and dry. No rash noted. He is not diaphoretic.  Psychiatric: He has a normal mood and affect. His behavior is normal. Thought content normal.  Vitals reviewed.   Results for orders placed or performed in visit on 11/01/15  Comprehensive metabolic panel  Result Value Ref Range   Glucose 146 (H) 65 - 99 mg/dL   BUN 22 8 - 27 mg/dL   Creatinine, Ser 1.61 (H) 0.76  - 1.27 mg/dL   GFR calc non Af Amer 55 (L) >59 mL/min/1.73   GFR calc Af Amer 64 >59 mL/min/1.73   BUN/Creatinine Ratio 16 10 - 24   Sodium 138 134 - 144 mmol/L   Potassium 4.9 3.5 - 5.2 mmol/L   Chloride 97 96 - 106 mmol/L   CO2 22 18 - 29 mmol/L   Calcium 10.0 8.6 - 10.2 mg/dL   Total Protein 7.1 6.0 - 8.5 g/dL   Albumin 4.4 3.6 - 4.8 g/dL   Globulin, Total 2.7 1.5 - 4.5 g/dL   Albumin/Globulin Ratio 1.6 1.2 - 2.2   Bilirubin Total 0.6 0.0 - 1.2 mg/dL   Alkaline Phosphatase 89 39 - 117 IU/L   AST 18 0 - 40 IU/L   ALT 19 0 - 44 IU/L  Lipid Panel Piccolo, Waived  Result Value Ref Range   Cholesterol Piccolo, Waived 205 (H) <200 mg/dL   HDL Chol Piccolo, Waived 45 (L) >59 mg/dL   Triglycerides Piccolo,Waived 160 (H) <150 mg/dL   Chol/HDL Ratio Piccolo,Waive 4.6 mg/dL   LDL Chol Calc Piccolo Waived 128 (H) <100 mg/dL   VLDL Chol Calc Piccolo,Waive 32 (H) <30 mg/dL  Bayer DCA Hb W9U Waived  Result Value Ref Range   Bayer DCA Hb A1c Waived 6.4 <7.0 %      Assessment & Plan:   Problem List Items Addressed This Visit      Unprioritized   Diabetes mellitus without complication (HCC) - Primary    Stable. Patient will continue taking metformin500 mg twice daily.      Relevant Medications   metFORMIN (GLUCOPHAGE) 500 MG tablet   lisinopril (PRINIVIL,ZESTRIL) 20 MG tablet   Other Relevant Orders   Comprehensive metabolic panel   Bayer DCA Hb E4V Waived   Hyperlipidemia    Patient refuses statin therapy.      Relevant Medications   amLODipine (NORVASC) 5 MG tablet   lisinopril (PRINIVIL,ZESTRIL) 20 MG tablet   Other Relevant Orders   Lipid Panel w/o Chol/HDL Ratio   Hypertension    Stable. Patient wants to attempt going off amlodipine. He will monitor his blood pressure measurements regularly during this time and if his blood pressure begins to rise he will resume amlodipine.       Relevant Medications   amLODipine (NORVASC) 5 MG tablet   lisinopril (PRINIVIL,ZESTRIL)  20 MG tablet       Follow up plan: Return in about 6 months (around 11/01/2016), or if symptoms worsen or fail to improve, for physical.

## 2016-05-04 NOTE — Assessment & Plan Note (Addendum)
Stable. Patient wants to attempt going off amlodipine. He will monitor his blood pressure measurements regularly during this time and if his blood pressure begins to rise he will resume amlodipine.

## 2016-05-04 NOTE — Assessment & Plan Note (Signed)
Stable. Patient will continue taking metformin500 mg twice daily.

## 2016-05-04 NOTE — Assessment & Plan Note (Signed)
Patient refuses statin therapy

## 2016-05-05 LAB — COMPREHENSIVE METABOLIC PANEL
A/G RATIO: 2.2 (ref 1.2–2.2)
ALBUMIN: 4.8 g/dL (ref 3.6–4.8)
ALK PHOS: 121 IU/L — AB (ref 39–117)
ALT: 20 IU/L (ref 0–44)
AST: 18 IU/L (ref 0–40)
BILIRUBIN TOTAL: 0.3 mg/dL (ref 0.0–1.2)
BUN / CREAT RATIO: 19 (ref 10–24)
BUN: 22 mg/dL (ref 8–27)
CHLORIDE: 98 mmol/L (ref 96–106)
CO2: 21 mmol/L (ref 18–29)
Calcium: 9.9 mg/dL (ref 8.6–10.2)
Creatinine, Ser: 1.13 mg/dL (ref 0.76–1.27)
GFR calc non Af Amer: 68 mL/min/{1.73_m2} (ref >59)
GFR, EST AFRICAN AMERICAN: 79 mL/min/{1.73_m2} (ref >59)
GLOBULIN, TOTAL: 2.2 g/dL (ref 1.5–4.5)
Glucose: 130 mg/dL — ABNORMAL HIGH (ref 65–99)
POTASSIUM: 4.7 mmol/L (ref 3.5–5.2)
SODIUM: 135 mmol/L (ref 134–144)
TOTAL PROTEIN: 7 g/dL (ref 6.0–8.5)

## 2016-05-05 LAB — LIPID PANEL W/O CHOL/HDL RATIO
CHOLESTEROL TOTAL: 170 mg/dL (ref 100–199)
HDL: 28 mg/dL — ABNORMAL LOW (ref >39)
LDL Calculated: 93 mg/dL (ref 0–99)
Triglycerides: 246 mg/dL — ABNORMAL HIGH (ref 0–149)
VLDL CHOLESTEROL CAL: 49 mg/dL — AB (ref 5–40)

## 2016-10-21 ENCOUNTER — Other Ambulatory Visit: Payer: Self-pay

## 2016-10-21 MED ORDER — AMLODIPINE BESYLATE 5 MG PO TABS: 5.0000 mg | ORAL_TABLET | Freq: Every day | ORAL | 1 refills | Status: DC

## 2016-10-21 MED ORDER — METFORMIN HCL 500 MG PO TABS: 500.0000 mg | ORAL_TABLET | Freq: Two times a day (BID) | ORAL | 1 refills | Status: DC

## 2016-10-21 MED ORDER — LISINOPRIL 20 MG PO TABS: 40.0000 mg | ORAL_TABLET | Freq: Every day | ORAL | 1 refills | Status: DC

## 2016-10-21 NOTE — Addendum Note (Signed)
Addended by: [name], Jaimarie Rapozo on: 10/21/2016 04:46 PM   Modules accepted: Orders

## 2016-10-21 NOTE — Telephone Encounter (Signed)
Patient had appointment 05/04/16 and has one scheduled for 11/02/16.

## 2016-10-21 NOTE — Telephone Encounter (Signed)
Received refill requests on patient's medication from [facility]. This was not listed in patient's pharmacies so I called and asked the patient about it. He states that he is using [facility] now.

## 2016-10-21 NOTE — Telephone Encounter (Signed)
Patient

## 2016-11-02 ENCOUNTER — Encounter: Payer: Self-pay | Admitting: Unknown Physician Specialty

## 2016-11-02 ENCOUNTER — Ambulatory Visit (INDEPENDENT_AMBULATORY_CARE_PROVIDER_SITE_OTHER): Payer: Medicare Other | Admitting: Unknown Physician Specialty

## 2016-11-02 VITALS — BP 123/68 | HR 53 | Temp 98.1°F | Ht 65.0 in | Wt 182.5 lb

## 2016-11-02 DIAGNOSIS — Z0000 Encounter for general adult medical examination without abnormal findings: Secondary | ICD-10-CM

## 2016-11-02 DIAGNOSIS — E119 Type 2 diabetes mellitus without complications: Secondary | ICD-10-CM | POA: Diagnosis not present

## 2016-11-02 DIAGNOSIS — Z87891 Personal history of nicotine dependence: Secondary | ICD-10-CM | POA: Diagnosis not present

## 2016-11-02 DIAGNOSIS — E782 Mixed hyperlipidemia: Secondary | ICD-10-CM | POA: Diagnosis not present

## 2016-11-02 DIAGNOSIS — I10 Essential (primary) hypertension: Secondary | ICD-10-CM

## 2016-11-02 DIAGNOSIS — Z5181 Encounter for therapeutic drug level monitoring: Secondary | ICD-10-CM

## 2016-11-02 MED ORDER — SIMVASTATIN 20 MG PO TABS: 20.0000 mg | ORAL_TABLET | Freq: Every day | ORAL | 3 refills | Status: DC

## 2016-11-02 NOTE — Assessment & Plan Note (Signed)
Start with a lower dose Simvastatin 20 mg.  ? If 40 mg Atorvastatin was just too high for him

## 2016-11-02 NOTE — Assessment & Plan Note (Signed)
Hgb A1C is 6.3.  Stable, continue present medications.  

## 2016-11-02 NOTE — Addendum Note (Signed)
Addended by: [name], Joachim Carton on: 11/02/2016 11:54 AM   Modules accepted: Orders

## 2016-11-02 NOTE — Assessment & Plan Note (Signed)
Stable, continue present medications.   

## 2016-11-02 NOTE — Progress Notes (Signed)
BP 123/68   Pulse (!) 53   Temp 98.1 F (36.7 C)   Ht 5\' 5"  (1.651 m)   Wt 182 lb 8 oz (82.8 kg)   SpO2 98%   BMI 30.37 kg/m    Subjective:    Patient ID: Lawrence Jensen, male    DOB: February 25, 1951, 66 y.o.   MRN: [id number]  HPI: Lawrence Jensen is a 66 y.o. male  Chief Complaint  Patient presents with  . Medicare Wellness  . Diabetes    pt states last eye exam in chart is correct   Diabetes: Using medications without difficulties No hypoglycemic episodes No hyperglycemic episodes Feet problems: none Blood Sugars averaging: 132 yesterday AM eye exam within last year Last Hgb A1C: 6.1  Hypertension  Using medications without difficulty Average home BPs Not checking   Using medication without problems or lightheadedness No chest pain with exertion or shortness of breath No Edema  Elevated Cholesterol States cholesterol medication gave him cramps in his feet which was disabling.  Review of chart shows started on Atorvastatin 40 mg.   Diet: "not bad" and feels it is decent.   Exercise: Staying busy    Social History   Social History  . Marital status: Married    Spouse name: N/A  . Number of children: N/A  . Years of education: N/A   Occupational History  . Not on file.   Social History Main Topics  . Smoking status: Former Smoker    Packs/day: 1.00    Years: 35.00    Types: Cigarettes    Quit date: 04/03/1995  . Smokeless tobacco: Never Used  . Alcohol use No  . Drug use: No  . Sexual activity: No   Other Topics Concern  . Not on file   Social History Narrative  . No narrative on file   Family History  Problem Relation Age of Onset  . Heart disease Mother   . Heart disease Father   . Congestive Heart Failure Father   . Cancer Father        prostate  . Diabetes Father   . Stroke Father   . Hypertension Father   . Cancer Brother   . Stroke Maternal Grandmother   . Heart disease Brother   . COPD Neg Hx    Past Medical  History:  Diagnosis Date  . Diabetes mellitus without complication (HCC)   . ED (erectile dysfunction)   . Hyperlipidemia   . Hypertension    Past Surgical History:  Procedure Laterality Date  . APPENDECTOMY  1999    Relevant past medical, surgical, family and social history reviewed and updated as indicated. Interim medical history since our last visit reviewed. Allergies and medications reviewed and updated.  Review of Systems  Per HPI unless specifically indicated above     Objective:    BP 123/68   Pulse (!) 53   Temp 98.1 F (36.7 C)   Ht 5\' 5"  (1.651 m)   Wt 182 lb 8 oz (82.8 kg)   SpO2 98%   BMI 30.37 kg/m   Wt Readings from Last 3 Encounters:  11/02/16 182 lb 8 oz (82.8 kg)  05/04/16 182 lb 12.8 oz (82.9 kg)  11/01/15 180 lb 3.2 oz (81.7 kg)    Physical Exam  Constitutional: He is oriented to person, place, and time. He appears well-developed and well-nourished.  HENT:  Head: Normocephalic.  Right Ear: Tympanic membrane, external ear and ear canal normal.  Left  Ear: Tympanic membrane, external ear and ear canal normal.  Mouth/Throat: Uvula is midline, oropharynx is clear and moist and mucous membranes are normal.  Eyes: Pupils are equal, round, and reactive to light.  Cardiovascular: Normal rate, regular rhythm and normal heart sounds.  Exam reveals no gallop and no friction rub.   No murmur heard. Pulmonary/Chest: Effort normal and breath sounds normal. No respiratory distress.  Abdominal: Soft. Bowel sounds are normal. He exhibits no distension. There is no tenderness.  Musculoskeletal: Normal range of motion.  Neurological: He is alert and oriented to person, place, and time. He has normal reflexes.  Skin: Skin is warm and dry.  Psychiatric: He has a normal mood and affect. His behavior is normal. Judgment and thought content normal.   EKG with normal sinus rhythm.  No acute or chronic changes  Results for orders placed or performed in visit on  05/04/16  Comprehensive metabolic panel  Result Value Ref Range   Glucose 130 (H) 65 - 99 mg/dL   BUN 22 8 - 27 mg/dL   Creatinine, Ser 1.61 0.76 - 1.27 mg/dL   GFR calc non Af Amer 68 >59 mL/min/1.73   GFR calc Af Amer 79 >59 mL/min/1.73   BUN/Creatinine Ratio 19 10 - 24   Sodium 135 134 - 144 mmol/L   Potassium 4.7 3.5 - 5.2 mmol/L   Chloride 98 96 - 106 mmol/L   CO2 21 18 - 29 mmol/L   Calcium 9.9 8.6 - 10.2 mg/dL   Total Protein 7.0 6.0 - 8.5 g/dL   Albumin 4.8 3.6 - 4.8 g/dL   Globulin, Total 2.2 1.5 - 4.5 g/dL   Albumin/Globulin Ratio 2.2 1.2 - 2.2   Bilirubin Total 0.3 0.0 - 1.2 mg/dL   Alkaline Phosphatase 121 (H) 39 - 117 IU/L   AST 18 0 - 40 IU/L   ALT 20 0 - 44 IU/L  Lipid Panel w/o Chol/HDL Ratio  Result Value Ref Range   Cholesterol, Total 170 100 - 199 mg/dL   Triglycerides 096 (H) 0 - 149 mg/dL   HDL 28 (L) >04 mg/dL   VLDL Cholesterol Cal 49 (H) 5 - 40 mg/dL   LDL Calculated 93 0 - 99 mg/dL  Bayer DCA Hb V4U Waived  Result Value Ref Range   Bayer DCA Hb A1c Waived 6.1 <7.0 %      Assessment & Plan:   Problem List Items Addressed This Visit      Unprioritized   Diabetes mellitus without complication (HCC)    Hgb A1C is 6.3.  Stable, continue present medications.        Relevant Medications   simvastatin (ZOCOR) 20 MG tablet   Other Relevant Orders   Comprehensive metabolic panel   Bayer DCA Hb J8J Waived   Microalbumin, Urine Waived   Hyperlipidemia    Start with a lower dose Simvastatin 20 mg.  ? If 40 mg Atorvastatin was just too high for him      Relevant Medications   simvastatin (ZOCOR) 20 MG tablet   Other Relevant Orders   Lipid Panel w/o Chol/HDL Ratio   Hypertension - Primary    Stable, continue present medications.        Relevant Medications   simvastatin (ZOCOR) 20 MG tablet   Other Relevant Orders   Comprehensive metabolic panel   Screening for prostate cancer    Other Visit Diagnoses    Initial Medicare annual wellness  visit       Relevant  Orders   EKG 12-Lead (Completed)   CBC with Differential/Platelet   PSA   Annual physical exam           Follow up plan: Return in about 6 months (around 05/05/2017).

## 2016-11-03 ENCOUNTER — Encounter: Payer: Self-pay | Admitting: Unknown Physician Specialty

## 2016-11-03 LAB — LIPID PANEL W/O CHOL/HDL RATIO
Cholesterol, Total: 179 mg/dL (ref 100–199)
HDL: 40 mg/dL (ref >39)
LDL Calculated: 115 mg/dL — ABNORMAL HIGH (ref 0–99)
Triglycerides: 119 mg/dL (ref 0–149)
VLDL Cholesterol Cal: 24 mg/dL (ref 5–40)

## 2016-11-03 LAB — CBC WITH DIFFERENTIAL/PLATELET
BASOS: 1 %
Basophils Absolute: 0 10*3/uL (ref 0.0–0.2)
EOS (ABSOLUTE): 0.1 10*3/uL (ref 0.0–0.4)
Eos: 3 %
Hematocrit: 43 % (ref 37.5–51.0)
Hemoglobin: 14.9 g/dL (ref 13.0–17.7)
Immature Grans (Abs): 0 10*3/uL (ref 0.0–0.1)
Immature Granulocytes: 0 %
Lymphocytes Absolute: 1.3 10*3/uL (ref 0.7–3.1)
Lymphs: 30 %
MCH: 30.5 pg (ref 26.6–33.0)
MCHC: 34.7 g/dL (ref 31.5–35.7)
MCV: 88 fL (ref 79–97)
MONOS ABS: 0.3 10*3/uL (ref 0.1–0.9)
Monocytes: 6 %
NEUTROS ABS: 2.5 10*3/uL (ref 1.4–7.0)
Neutrophils: 60 %
PLATELETS: 197 10*3/uL (ref 150–379)
RBC: 4.89 x10E6/uL (ref 4.14–5.80)
RDW: 13.8 % (ref 12.3–15.4)
WBC: 4.2 10*3/uL (ref 3.4–10.8)

## 2016-11-03 LAB — COMPREHENSIVE METABOLIC PANEL
A/G RATIO: 2.1 (ref 1.2–2.2)
ALT: 11 IU/L (ref 0–44)
AST: 13 IU/L (ref 0–40)
Albumin: 4.7 g/dL (ref 3.6–4.8)
Alkaline Phosphatase: 91 IU/L (ref 39–117)
BILIRUBIN TOTAL: 0.4 mg/dL (ref 0.0–1.2)
BUN/Creatinine Ratio: 19 (ref 10–24)
BUN: 19 mg/dL (ref 8–27)
CHLORIDE: 101 mmol/L (ref 96–106)
CO2: 26 mmol/L (ref 20–29)
Calcium: 9.9 mg/dL (ref 8.6–10.2)
Creatinine, Ser: 1.01 mg/dL (ref 0.76–1.27)
GFR calc non Af Amer: 78 mL/min/{1.73_m2} (ref >59)
GFR, EST AFRICAN AMERICAN: 90 mL/min/{1.73_m2} (ref >59)
Globulin, Total: 2.2 g/dL (ref 1.5–4.5)
Glucose: 129 mg/dL — ABNORMAL HIGH (ref 65–99)
POTASSIUM: 4.5 mmol/L (ref 3.5–5.2)
Sodium: 142 mmol/L (ref 134–144)
TOTAL PROTEIN: 6.9 g/dL (ref 6.0–8.5)

## 2016-11-03 LAB — PSA: Prostate Specific Ag, Serum: 0.4 ng/mL (ref 0.0–4.0)

## 2016-11-03 NOTE — Progress Notes (Signed)
Patient notified

## 2016-11-05 LAB — MICROALBUMIN, URINE WAIVED
Creatinine, Urine Waived: 200 mg/dL (ref 10–300)
Microalb, Ur Waived: 30 mg/L — ABNORMAL HIGH (ref 0–19)
Microalb/Creat Ratio: <30 mg/g (ref <30)

## 2016-11-05 LAB — BAYER DCA HB A1C WAIVED: HB A1C: 6.3 % (ref <7.0)

## 2016-12-25 ENCOUNTER — Other Ambulatory Visit (INDEPENDENT_AMBULATORY_CARE_PROVIDER_SITE_OTHER): Payer: Self-pay

## 2016-12-25 ENCOUNTER — Encounter (INDEPENDENT_AMBULATORY_CARE_PROVIDER_SITE_OTHER): Payer: Self-pay | Admitting: Vascular Surgery

## 2017-02-02 ENCOUNTER — Ambulatory Visit (INDEPENDENT_AMBULATORY_CARE_PROVIDER_SITE_OTHER): Payer: Self-pay

## 2017-02-02 ENCOUNTER — Encounter (INDEPENDENT_AMBULATORY_CARE_PROVIDER_SITE_OTHER): Payer: Self-pay | Admitting: Vascular Surgery

## 2017-02-02 ENCOUNTER — Other Ambulatory Visit (INDEPENDENT_AMBULATORY_CARE_PROVIDER_SITE_OTHER): Payer: Self-pay | Admitting: Vascular Surgery

## 2017-02-02 ENCOUNTER — Ambulatory Visit (INDEPENDENT_AMBULATORY_CARE_PROVIDER_SITE_OTHER): Payer: Medicare Other | Admitting: Vascular Surgery

## 2017-02-02 ENCOUNTER — Other Ambulatory Visit: Payer: Self-pay | Admitting: Unknown Physician Specialty

## 2017-02-02 VITALS — BP 133/69 | HR 56 | Resp 17 | Ht 66.0 in | Wt 186.0 lb

## 2017-02-02 DIAGNOSIS — I714 Abdominal aortic aneurysm, without rupture, unspecified: Secondary | ICD-10-CM

## 2017-02-02 DIAGNOSIS — I10 Essential (primary) hypertension: Secondary | ICD-10-CM

## 2017-02-02 DIAGNOSIS — Z87891 Personal history of nicotine dependence: Secondary | ICD-10-CM

## 2017-02-02 DIAGNOSIS — E119 Type 2 diabetes mellitus without complications: Secondary | ICD-10-CM

## 2017-02-02 DIAGNOSIS — E782 Mixed hyperlipidemia: Secondary | ICD-10-CM

## 2017-02-02 NOTE — Progress Notes (Signed)
Subjective:    Patient ID: Lawrence Jensen, male    DOB: February 15, 1951, 66 y.o.   MRN: [id number] Chief Complaint  Patient presents with  . New Patient (Initial Visit)    AAA   Patient presents for a abdominal aortic aneurysm screening. Patient notes that he smoked tobacco until the age of 4. Patient with multiple risk factors for aneurysmal and peripheral artery disease. Patient denies any abdominal pain back pain or thrombosis to the lower extremity. Patient denies any claudication, rest pain or ulceration to the lower extremity. Patient denies any amaurosis fugax or neurological deficits. Patient underwent a abdominal aortic duplex scan which was notable for no dilatation of the abdominal aorta to suggest abdominal aortic aneurysm. Patent IVC. There was no previous for comparison. Patient denies any swelling to the lower extremity. Patient denies any fever, nausea vomiting.   Review of Systems  Constitutional: Negative.   HENT: Negative.   Eyes: Negative.   Respiratory: Negative.   Cardiovascular: Negative.   Gastrointestinal: Negative.   Endocrine: Negative.   Genitourinary: Negative.   Musculoskeletal: Negative.   Skin: Negative.   Allergic/Immunologic: Negative.   Neurological: Negative.   Hematological: Negative.   Psychiatric/Behavioral: Negative.       Objective:   Physical Exam  Constitutional: He appears well-developed and well-nourished. No distress.  HENT:  Head: Normocephalic and atraumatic.  Eyes: Pupils are equal, round, and reactive to light. Conjunctivae are normal.  Neck: Normal range of motion.  No carotid bruit auscultated on exam  Cardiovascular: Normal rate, regular rhythm, normal heart sounds and intact distal pulses.   Pulses:      Radial pulses are 2+ on the right side, and 2+ on the left side.       Dorsalis pedis pulses are 2+ on the right side, and 2+ on the left side.       Posterior tibial pulses are 2+ on the right side, and 2+ on the left  side.  Pulmonary/Chest: Effort normal.  Musculoskeletal: Normal range of motion. He exhibits no edema.  Neurological: He is alert.  Skin: Skin is warm and dry. He is not diaphoretic.  Psychiatric: He has a normal mood and affect. His behavior is normal. Judgment and thought content normal.  Vitals reviewed.  BP 133/69 (BP Location: Right Arm)   Pulse (!) 56   Resp 17   Ht 5\' 6"  (1.676 m)   Wt 186 lb (84.4 kg)   BMI 30.02 kg/m   Past Medical History:  Diagnosis Date  . Diabetes mellitus without complication (HCC)   . ED (erectile dysfunction)   . Hyperlipidemia   . Hypertension    Social History   Social History  . Marital status: Married    Spouse name: N/A  . Number of children: N/A  . Years of education: N/A   Occupational History  . Not on file.   Social History Main Topics  . Smoking status: Former Smoker    Packs/day: 1.00    Years: 35.00    Types: Cigarettes    Quit date: 04/03/1995  . Smokeless tobacco: Never Used  . Alcohol use No  . Drug use: No  . Sexual activity: No   Other Topics Concern  . Not on file   Social History Narrative  . No narrative on file   Past Surgical History:  Procedure Laterality Date  . APPENDECTOMY  1999   Family History  Problem Relation Age of Onset  . Heart disease Mother   .  Heart disease Father   . Congestive Heart Failure Father   . Cancer Father        prostate  . Diabetes Father   . Stroke Father   . Hypertension Father   . Cancer Brother   . Stroke Maternal Grandmother   . Heart disease Brother   . COPD Neg Hx    No Known Allergies     Assessment & Plan:  Patient presents for a abdominal aortic aneurysm screening. Patient notes that he smoked tobacco until the age of 66. Patient with multiple risk factors for aneurysmal and peripheral artery disease. Patient denies any abdominal pain back pain or thrombosis to the lower extremity. Patient denies any claudication, rest pain or ulceration to the lower  extremity. Patient denies any amaurosis fugax or neurological deficits. Patient underwent a abdominal aortic duplex scan which was notable for no dilatation of the abdominal aorta to suggest abdominal aortic aneurysm. Patent IVC. There was no previous for comparison. Patient denies any swelling to the lower extremity. Patient denies any fever, nausea vomiting.  1. History of smoking - Stable Patient with history of smoking tobacco until the age of 66. Patient does not currently smoke tobacco. This is a major risk factor for peripheral artery and aneurysmal disease.  2. Diabetes mellitus without complication (HCC) - Stable Encouraged good control as its slows the progression of atherosclerotic disease  3. Mixed hyperlipidemia - Stable Encouraged good control as its slows the progression of atherosclerotic disease  4. Essential hypertension - Stable Encouraged good control as its slows the progression of atherosclerotic disease  Current Outpatient Prescriptions on File Prior to Visit  Medication Sig Dispense Refill  . amLODipine (NORVASC) 5 MG tablet Take 1 tablet (5 mg total) by mouth daily. 90 tablet 1  . aspirin EC 81 MG tablet Take 81 mg by mouth daily.    Marland Kitchen. lisinopril (PRINIVIL,ZESTRIL) 20 MG tablet Take 2 tablets (40 mg total) by mouth daily. 180 tablet 1  . metFORMIN (GLUCOPHAGE) 500 MG tablet Take 1 tablet (500 mg total) by mouth 2 (two) times daily with a meal. With largest meal of the day 180 tablet 1  . simvastatin (ZOCOR) 20 MG tablet Take 1 tablet (20 mg total) by mouth at bedtime. 90 tablet 3   No current facility-administered medications on file prior to visit.     There are no Patient Instructions on file for this visit. No Follow-up on file.   Monroe Qin A Tamia Dial, PA-C

## 2017-02-05 ENCOUNTER — Encounter (INDEPENDENT_AMBULATORY_CARE_PROVIDER_SITE_OTHER): Payer: Self-pay | Admitting: Vascular Surgery

## 2017-02-05 ENCOUNTER — Other Ambulatory Visit (INDEPENDENT_AMBULATORY_CARE_PROVIDER_SITE_OTHER): Payer: Self-pay

## 2017-04-04 ENCOUNTER — Other Ambulatory Visit: Payer: Self-pay | Admitting: Unknown Physician Specialty

## 2017-04-08 ENCOUNTER — Encounter: Payer: Self-pay | Admitting: Unknown Physician Specialty

## 2017-05-05 ENCOUNTER — Ambulatory Visit: Payer: Medicare Other | Admitting: Unknown Physician Specialty

## 2017-05-11 ENCOUNTER — Ambulatory Visit: Payer: Medicare Other | Admitting: Unknown Physician Specialty

## 2017-05-11 ENCOUNTER — Encounter: Payer: Self-pay | Admitting: Unknown Physician Specialty

## 2017-05-11 VITALS — BP 120/69 | HR 54 | Temp 97.5°F | Wt 186.0 lb

## 2017-05-11 DIAGNOSIS — N529 Male erectile dysfunction, unspecified: Secondary | ICD-10-CM

## 2017-05-11 DIAGNOSIS — E119 Type 2 diabetes mellitus without complications: Secondary | ICD-10-CM

## 2017-05-11 DIAGNOSIS — E782 Mixed hyperlipidemia: Secondary | ICD-10-CM | POA: Diagnosis not present

## 2017-05-11 DIAGNOSIS — I10 Essential (primary) hypertension: Secondary | ICD-10-CM

## 2017-05-11 LAB — LIPID PANEL PICCOLO, WAIVED
CHOL/HDL RATIO PICCOLO,WAIVE: 5.1 mg/dL — AB
CHOLESTEROL PICCOLO, WAIVED: 180 mg/dL (ref <200)
HDL CHOL PICCOLO, WAIVED: 35 mg/dL — AB (ref >59)
LDL Chol Calc Piccolo Waived: 112 mg/dL — ABNORMAL HIGH (ref <100)
TRIGLYCERIDES PICCOLO,WAIVED: 161 mg/dL — AB (ref <150)
VLDL Chol Calc Piccolo,Waive: 32 mg/dL — ABNORMAL HIGH (ref <30)

## 2017-05-11 LAB — BAYER DCA HB A1C WAIVED: HB A1C (BAYER DCA - WAIVED): 6.2 % (ref <7.0)

## 2017-05-11 MED ORDER — ROSUVASTATIN CALCIUM 20 MG PO TABS: 20.0000 mg | ORAL_TABLET | Freq: Every day | ORAL | 1 refills | Status: DC

## 2017-05-11 NOTE — Assessment & Plan Note (Signed)
Hgb A1C is 6.2%.  Continue present medications 

## 2017-05-11 NOTE — Assessment & Plan Note (Signed)
LDL is 112. Not to goal.  Atorvastatin gave him foot cramps.  On Amlodipine and cannot increase Simvastatin.  Change to Crestor 20 mg.

## 2017-05-11 NOTE — Progress Notes (Signed)
BP 120/69   Pulse (!) 54   Temp (!) 97.5 F (36.4 C) (Oral)   Wt 186 lb (84.4 kg)   SpO2 97%   BMI 30.02 kg/m    Subjective:    Patient ID: Lawrence Jensen, male    DOB: [birth year], 67 y.o.   MRN: [id number]  HPI: Lawrence Jensen is a 67 y.o. male  Chief Complaint  Patient presents with  . Diabetes    pt states he has not had a recent eye exam  . Hypertension  . Hypothyroidism   Diabetes: Using medications without difficulties No hypoglycemic episodes No hyperglycemic episodes Feet problems: none Blood Sugars averaging: 130-150 eye exam within last year: No Last Hgb A1C: 6.3  Hypertension  Using medications without difficulty Average home BPs 110/60   Using medication without problems or lightheadedness No chest pain with exertion or shortness of breath No Edema  Elevated Cholesterol Using medications without problems No Muscle aches  Diet: Exercise: "Staying with it"    ED He tried both Cialis and Viagra without any benefit.    Relevant past medical, surgical, family and social history reviewed and updated as indicated. Interim medical history since our last visit reviewed. Allergies and medications reviewed and updated.  Review of Systems  Per HPI unless specifically indicated above     Objective:    BP 120/69   Pulse (!) 54   Temp (!) 97.5 F (36.4 C) (Oral)   Wt 186 lb (84.4 kg)   SpO2 97%   BMI 30.02 kg/m   Wt Readings from Last 3 Encounters:  05/11/17 186 lb (84.4 kg)  02/02/17 186 lb (84.4 kg)  11/02/16 182 lb 8 oz (82.8 kg)    Physical Exam  Constitutional: He is oriented to person, place, and time. He appears well-developed and well-nourished. No distress.  HENT:  Head: Normocephalic and atraumatic.  Eyes: Conjunctivae and lids are normal. Right eye exhibits no discharge. Left eye exhibits no discharge. No scleral icterus.  Neck: Normal range of motion. Neck supple. No JVD present. Carotid bruit is not present.    Cardiovascular: Normal rate, regular rhythm and normal heart sounds.  Pulmonary/Chest: Effort normal and breath sounds normal. No respiratory distress.  Abdominal: Normal appearance. There is no splenomegaly or hepatomegaly.  Musculoskeletal: Normal range of motion.  Neurological: He is alert and oriented to person, place, and time.  Skin: Skin is warm, dry and intact. No rash noted. No pallor.  Psychiatric: He has a normal mood and affect. His behavior is normal. Judgment and thought content normal.      Assessment & Plan:   Problem List Items Addressed This Visit      Unprioritized   Diabetes mellitus without complication (HCC)    Hgb A1C is 6.2.  Continue present medications      Relevant Medications   rosuvastatin (CRESTOR) 20 MG tablet   Other Relevant Orders   Bayer DCA Hb A1c Waived   ED (erectile dysfunction)    Failed Cialis and Viagra.  Refer to Urolology      Relevant Orders   Ambulatory referral to Urology   Hyperlipidemia - Primary    LDL is 112. Not to goal.  Atorvastatin gave him foot cramps.  On Amlodipine and cannot increase Simvastatin.  Change to Crestor 20 mg.        Relevant Medications   rosuvastatin (CRESTOR) 20 MG tablet   Other Relevant Orders   Lipid Panel Piccolo, Waived   Hypertension  Stable, continue present medications.        Relevant Medications   rosuvastatin (CRESTOR) 20 MG tablet   Other Relevant Orders   Comprehensive metabolic panel       Follow up plan: Return in about 6 months (around 11/08/2017) for physical.

## 2017-05-11 NOTE — Assessment & Plan Note (Signed)
Failed Cialis and Viagra.  Refer to Urolology

## 2017-05-11 NOTE — Assessment & Plan Note (Signed)
Stable, continue present medications.   

## 2017-05-12 ENCOUNTER — Encounter: Payer: Self-pay | Admitting: Unknown Physician Specialty

## 2017-05-12 LAB — COMPREHENSIVE METABOLIC PANEL
ALT: 18 IU/L (ref 0–44)
AST: 20 IU/L (ref 0–40)
Albumin/Globulin Ratio: 1.9 (ref 1.2–2.2)
Albumin: 4.4 g/dL (ref 3.6–4.8)
Alkaline Phosphatase: 87 IU/L (ref 39–117)
BILIRUBIN TOTAL: 0.4 mg/dL (ref 0.0–1.2)
BUN/Creatinine Ratio: 13 (ref 10–24)
BUN: 13 mg/dL (ref 8–27)
CALCIUM: 9.5 mg/dL (ref 8.6–10.2)
CHLORIDE: 101 mmol/L (ref 96–106)
CO2: 22 mmol/L (ref 20–29)
Creatinine, Ser: 1.04 mg/dL (ref 0.76–1.27)
GFR calc non Af Amer: 75 mL/min/{1.73_m2} (ref >59)
GFR, EST AFRICAN AMERICAN: 87 mL/min/{1.73_m2} (ref >59)
GLUCOSE: 119 mg/dL — AB (ref 65–99)
Globulin, Total: 2.3 g/dL (ref 1.5–4.5)
Potassium: 4.2 mmol/L (ref 3.5–5.2)
Sodium: 142 mmol/L (ref 134–144)
TOTAL PROTEIN: 6.7 g/dL (ref 6.0–8.5)

## 2017-05-17 ENCOUNTER — Telehealth: Payer: Self-pay | Admitting: Unknown Physician Specialty

## 2017-05-17 MED ORDER — AMLODIPINE BESYLATE 5 MG PO TABS: 5.0000 mg | ORAL_TABLET | Freq: Every day | ORAL | 1 refills | Status: DC

## 2017-05-17 MED ORDER — METFORMIN HCL 500 MG PO TABS: 500.0000 mg | ORAL_TABLET | Freq: Two times a day (BID) | ORAL | 1 refills | Status: DC

## 2017-05-17 MED ORDER — LISINOPRIL 20 MG PO TABS: 40.0000 mg | ORAL_TABLET | Freq: Every day | ORAL | 1 refills | Status: DC

## 2017-05-17 NOTE — Telephone Encounter (Signed)
But, it looks like they might have been sent ot the wrong pharmacy

## 2017-05-17 NOTE — Telephone Encounter (Signed)
°  RM for notification. See Telephone encounter for:   05/17/17.  Relation to pt: self  Call back number: [phone] Pharmacy: [facility] - [place] - [id number] [place] [phone] (Phone) [phone] (Fax)   Reason for call:  Patient was last seen 05/11/17 and requesting amLODipine (NORVASC) 5 MG tablet , lisinopril (PRINIVIL,ZESTRIL) 20 MG tablet, metFORMIN (GLUCOPHAGE) 500 MG tablet. Patient spoke with mail order advised Rx request was never received.   Patient states he's very upset this wasn't taking care of the day he was seen in the office and would like to speak with a office manager today, please advise

## 2017-05-17 NOTE — Telephone Encounter (Signed)
Taken care of.  Please let pt know we apologize.  But the prescriptions look like they were written on 04/05/17

## 2017-05-28 ENCOUNTER — Encounter: Payer: Self-pay | Admitting: Urology

## 2017-05-28 ENCOUNTER — Ambulatory Visit (INDEPENDENT_AMBULATORY_CARE_PROVIDER_SITE_OTHER): Payer: Medicare Other | Admitting: Urology

## 2017-05-28 VITALS — BP 145/72 | HR 64 | Ht 65.0 in | Wt 186.0 lb

## 2017-05-28 DIAGNOSIS — Z125 Encounter for screening for malignant neoplasm of prostate: Secondary | ICD-10-CM | POA: Diagnosis not present

## 2017-05-28 DIAGNOSIS — N3281 Overactive bladder: Secondary | ICD-10-CM | POA: Diagnosis not present

## 2017-05-28 DIAGNOSIS — N529 Male erectile dysfunction, unspecified: Secondary | ICD-10-CM

## 2017-05-28 MED ORDER — MIRABEGRON ER 25 MG PO TB24: 25.0000 mg | ORAL_TABLET | Freq: Every day | ORAL | 11 refills | Status: DC

## 2017-05-28 NOTE — Progress Notes (Signed)
05/28/2017 11:00 AM   Lawrence Jensen 04-22-1950 [id number]  Referring provider: Gabriel Jensen, [name], NP [address] [place], [place] [address]  Chief Complaint  Patient presents with  . Erectile Dysfunction    HPI: The patient is a 67 year old gentleman who presents today to discuss erectile dysfunction and frequent urination.  1.  Erectile dysfunction The patient has tried both Cialis and Viagra without any benefit. Patient does not take nitrates.  He is unable to obtain or maintain an erection sufficient for intercourse.  He also is very concerned about the cost of erectile medications.  He has used the vacuum erection device in the past with only moderate success.  2.  BPH/OAB Patient Spears complaint is frequency and urgency.  He has daytime frequency of approximately once per hour.  He has urgency with urge incontinence frequently.  He is very bothered by this.  He he denies a weak stream, straining, or feeling of incomplete bladder emptying.  He has never tried medications for urination.  3.  Prostate cancer screening PSA was 0.4 in July 2018  The patient's father had prostate cancer at the age of 66 underwent a prostatectomy.   PMH: Past Medical History:  Diagnosis Date  . Diabetes mellitus without complication (HCC)   . ED (erectile dysfunction)   . Hyperlipidemia   . Hypertension     Surgical History: Past Surgical History:  Procedure Laterality Date  . APPENDECTOMY  1999    Home Medications:  Allergies as of 05/28/2017   No Known Allergies     Medication List        Accurate as of 05/28/17 11:00 AM. Always use your most recent med list.          amLODipine 5 MG tablet Commonly known as:  NORVASC Take 1 tablet (5 mg total) by mouth daily.   aspirin EC 81 MG tablet Take 81 mg by mouth daily.   lisinopril 20 MG tablet Commonly known as:  PRINIVIL,ZESTRIL Take 2 tablets (40 mg total) by mouth daily.   metFORMIN 500 MG tablet Commonly known as:   GLUCOPHAGE Take 1 tablet (500 mg total) by mouth 2 (two) times daily with a meal.   rosuvastatin 20 MG tablet Commonly known as:  CRESTOR Take 1 tablet (20 mg total) by mouth daily.       Allergies: No Known Allergies  Family History: Family History  Problem Relation Age of Onset  . Heart disease Mother   . Heart disease Father   . Congestive Heart Failure Father   . Cancer Father        prostate  . Diabetes Father   . Stroke Father   . Hypertension Father   . Cancer Brother   . Stroke Maternal Grandmother   . Heart disease Brother   . COPD Neg Hx     Social History:  reports that he quit smoking about 22 years ago. His smoking use included cigarettes. He has a 35.00 pack-year smoking history. he has never used smokeless tobacco. He reports that he does not drink alcohol or use drugs.  ROS: UROLOGY Frequent Urination?: Yes Hard to postpone urination?: Yes Burning/pain with urination?: No Get up at night to urinate?: No Leakage of urine?: Yes Urine stream starts and stops?: No Trouble starting stream?: No Do you have to strain to urinate?: No Blood in urine?: No Urinary tract infection?: No Sexually transmitted disease?: No Injury to kidneys or bladder?: No Painful intercourse?: No Weak stream?: No Erection problems?: Yes  Penile pain?: No  Gastrointestinal Nausea?: No Vomiting?: No Indigestion/heartburn?: No Diarrhea?: No Constipation?: No  Constitutional Fever: No Night sweats?: No Weight loss?: No Fatigue?: Yes  Skin Skin rash/lesions?: No Itching?: No  Eyes Blurred vision?: No Double vision?: No  Ears/Nose/Throat Sore throat?: No Sinus problems?: No  Hematologic/Lymphatic Swollen glands?: No Easy bruising?: Yes  Cardiovascular Leg swelling?: No Chest pain?: No  Respiratory Cough?: No Shortness of breath?: No  Endocrine Excessive thirst?: No  Musculoskeletal Back pain?: No Joint pain?: No  Neurological Headaches?:  No Dizziness?: No  Psychologic Depression?: No Anxiety?: No  Physical Exam: BP (!) 145/72   Pulse 64   Ht 5\' 5"  (1.651 m)   Wt 186 lb (84.4 kg)   BMI 30.95 kg/m   Constitutional:  Alert and oriented, No acute distress. HEENT: [place] AT, moist mucus membranes.  Trachea midline, no masses. Cardiovascular: No clubbing, cyanosis, or edema. Respiratory: Normal respiratory effort, no increased work of breathing. GI: Abdomen is soft, nontender, nondistended, no abdominal masses GU: No CVA tenderness.  Normal phallus.  Testicles descended bilaterally.  Benign.  DRE: 35 g benign. Skin: No rashes, bruises or suspicious lesions. Lymph: No cervical or inguinal adenopathy. Neurologic: Grossly intact, no focal deficits, moving all 4 extremities. Psychiatric: Normal mood and affect.  Laboratory Data: Lab Results  Component Value Date   WBC 4.2 11/02/2016   HGB 14.9 11/02/2016   HCT 43.0 11/02/2016   MCV 88 11/02/2016   PLT 197 11/02/2016    Lab Results  Component Value Date   CREATININE 1.04 05/11/2017    No results found for: PSA  No results found for: TESTOSTERONE  Lab Results  Component Value Date   HGBA1C 6.3 (H) 04/03/2015    Urinalysis    Component Value Date/Time   APPEARANCEUR Clear 07/03/2015 1359   GLUCOSEU Negative 07/03/2015 1359   BILIRUBINUR Negative 07/03/2015 1359   PROTEINUR Negative 07/03/2015 1359   NITRITE Negative 07/03/2015 1359   LEUKOCYTESUR 1+ (A) 07/03/2015 1359    Assessment & Plan:    1.  Erectile dysfunction I discussed treatment options for erectile dysfunction with the patient which includes try another oral PDE 5 inhibitor, vacuum erection device, MUSE, and penile injection therapy.  We discussed the risks and benefits of each.  Since he has failed to PDE 5 inhibitors, I do not expect for him to have much success with another trial.  He is somewhat interested in penile injection therapy but is concerned of the cost.  We discussed the risks  and benefits.  We discussed the risk of priapism and need for emergent intervention.  He will think about his options and potentially pursue penile injection therapy at his next visit.  2.  Overactive bladder The patient's symptoms are more consistent with an overactive bladder since he is 0 obstructive symptoms.  Will undergo a trial of Myrbetriq.  If this is cost prohibitive, we can switch to an anticholinergic.  Follow-up in 3 months.  3.  Prostate cancer screening Up-to-date  No Follow-up on file.  Hildred Laser, MD  [facility] [address], [address] [place], [place] [address] [phone]

## 2017-08-27 ENCOUNTER — Ambulatory Visit: Payer: Medicare Other

## 2017-09-16 ENCOUNTER — Ambulatory Visit: Payer: Medicare Other

## 2017-09-26 ENCOUNTER — Other Ambulatory Visit: Payer: Self-pay | Admitting: Unknown Physician Specialty

## 2017-10-20 ENCOUNTER — Ambulatory Visit: Payer: Medicare Other | Admitting: Urology

## 2017-10-28 ENCOUNTER — Ambulatory Visit: Payer: Medicare Other | Admitting: Urology

## 2017-10-28 ENCOUNTER — Encounter: Payer: Self-pay | Admitting: Urology

## 2017-10-28 ENCOUNTER — Telehealth: Payer: Self-pay | Admitting: Urology

## 2017-10-28 VITALS — BP 105/62 | HR 51 | Resp 16 | Ht 66.0 in | Wt 185.6 lb

## 2017-10-28 DIAGNOSIS — N5201 Erectile dysfunction due to arterial insufficiency: Secondary | ICD-10-CM

## 2017-10-28 LAB — URINALYSIS, COMPLETE
Bilirubin, UA: NEGATIVE
GLUCOSE, UA: NEGATIVE
KETONES UA: NEGATIVE
LEUKOCYTES UA: NEGATIVE
Nitrite, UA: NEGATIVE
PROTEIN UA: NEGATIVE
RBC UA: NEGATIVE
Urobilinogen, Ur: 0.2 mg/dL (ref 0.2–1.0)
pH, UA: 5 (ref 5.0–7.5)

## 2017-10-28 NOTE — Telephone Encounter (Signed)
FYI -- Pt left without checking out/making return appt. Called pt LMOM to schedule penile injection training with Carollee HerterShannon.

## 2017-10-28 NOTE — Progress Notes (Signed)
10/28/2017 9:17 AM   Lawrence Jensen 05-03-1950 [id number]  Referring provider: [name], [name], NP [address] [place], [place] [id number]  Chief Complaint  Patient presents with  . Erectile Dysfunction  . Over Active Bladder    HPI: 67 year old male presents for follow-up of erectile dysfunction and lower urinary tract symptoms.  He saw Dr. [name] in February 2019 for ED and voiding symptoms.  He had previously failed PDE 5 inhibitors.  Other treatment options were discussed and he purchased a vacuum erection device however states it has not been effective.  He is interested in pursuing intracavernosal injections.  He was also given a trial of Myrbetriq for urinary frequency and urgency.  He states this medication was effective and when he ran out of the samples his lower urinary tract symptoms were improved and he presently has no bothersome voiding symptoms.   PMH: Past Medical History:  Diagnosis Date  . Diabetes mellitus without complication (HCC)   . ED (erectile dysfunction)   . Hyperlipidemia   . Hypertension     Surgical History: Past Surgical History:  Procedure Laterality Date  . APPENDECTOMY  1999    Home Medications:  Allergies as of 10/28/2017   No Known Allergies     Medication List        Accurate as of 10/28/17  9:17 AM. Always use your most recent med list.          amLODipine 5 MG tablet Commonly known as:  NORVASC Take 1 tablet (5 mg total) by mouth daily.   aspirin EC 81 MG tablet Take 81 mg by mouth daily.   lisinopril 20 MG tablet Commonly known as:  PRINIVIL,ZESTRIL Take 2 tablets (40 mg total) by mouth daily.   metFORMIN 500 MG tablet Commonly known as:  GLUCOPHAGE Take 1 tablet (500 mg total) by mouth 2 (two) times daily with a meal.   rosuvastatin 20 MG tablet Commonly known as:  CRESTOR TAKE 1 TABLET BY MOUTH  DAILY       Allergies: No Known Allergies  Family History: Family History  Problem Relation Age of Onset  .  Heart disease Mother   . Heart disease Father   . Congestive Heart Failure Father   . Cancer Father        prostate  . Diabetes Father   . Stroke Father   . Hypertension Father   . Cancer Brother   . Stroke Maternal Grandmother   . Heart disease Brother   . COPD Neg Hx     Social History:  reports that he quit smoking about 22 years ago. His smoking use included cigarettes. He has a 35.00 pack-year smoking history. He has never used smokeless tobacco. He reports that he does not drink alcohol or use drugs.  ROS: UROLOGY Frequent Urination?: No Hard to postpone urination?: No Burning/pain with urination?: No Get up at night to urinate?: No Leakage of urine?: No Urine stream starts and stops?: No Trouble starting stream?: No Do you have to strain to urinate?: No Blood in urine?: No Urinary tract infection?: No Sexually transmitted disease?: No Injury to kidneys or bladder?: No Painful intercourse?: No Weak stream?: No Erection problems?: Yes Penile pain?: No  Gastrointestinal Nausea?: No Vomiting?: No Indigestion/heartburn?: No Diarrhea?: No Constipation?: No  Constitutional Fever: No Night sweats?: No Weight loss?: No Fatigue?: No  Skin Skin rash/lesions?: No Itching?: No  Eyes Blurred vision?: No Double vision?: No  Ears/Nose/Throat Sore throat?: No Sinus problems?: No  Hematologic/Lymphatic  Swollen glands?: No Easy bruising?: Yes  Cardiovascular Leg swelling?: No Chest pain?: No  Respiratory Cough?: No Shortness of breath?: No  Endocrine Excessive thirst?: No  Musculoskeletal Back pain?: No Joint pain?: No  Neurological Headaches?: No Dizziness?: No  Psychologic Depression?: No Anxiety?: No  Physical Exam: BP 105/62   Pulse (!) 51   Resp 16   Ht 5\' 6"  (1.676 m)   Wt 185 lb 9.6 oz (84.2 kg)   SpO2 96%   BMI 29.96 kg/m   Constitutional:  Alert and oriented, No acute distress. HEENT: [place] AT, moist mucus membranes.  Trachea  midline, no masses. Cardiovascular: No clubbing, cyanosis, or edema. Respiratory: Normal respiratory effort, no increased work of breathing. GI: Abdomen is soft, nontender, nondistended, no abdominal masses GU: No CVA tenderness Lymph: No cervical or inguinal lymphadenopathy. Skin: No rashes, bruises or suspicious lesions. Neurologic: Grossly intact, no focal deficits, moving all 4 extremities. Psychiatric: Normal mood and affect.   Assessment & Plan:   67 year old male with the erectile dysfunction interested in pursuing intracavernosal injections.  Will send in Rx for a Trimix test dose and he will return for injection training.   Riki Altes, MD  [facility] [address], [address] [place], [place] [phone]

## 2017-11-09 ENCOUNTER — Encounter: Payer: Self-pay | Admitting: Unknown Physician Specialty

## 2017-11-09 ENCOUNTER — Ambulatory Visit (INDEPENDENT_AMBULATORY_CARE_PROVIDER_SITE_OTHER): Payer: Medicare Other | Admitting: Unknown Physician Specialty

## 2017-11-09 VITALS — BP 130/86 | HR 56 | Temp 98.3°F | Ht 66.0 in | Wt 186.6 lb

## 2017-11-09 DIAGNOSIS — Z125 Encounter for screening for malignant neoplasm of prostate: Secondary | ICD-10-CM

## 2017-11-09 DIAGNOSIS — K222 Esophageal obstruction: Secondary | ICD-10-CM

## 2017-11-09 DIAGNOSIS — Z0000 Encounter for general adult medical examination without abnormal findings: Secondary | ICD-10-CM | POA: Diagnosis not present

## 2017-11-09 DIAGNOSIS — E782 Mixed hyperlipidemia: Secondary | ICD-10-CM

## 2017-11-09 DIAGNOSIS — I10 Essential (primary) hypertension: Secondary | ICD-10-CM | POA: Diagnosis not present

## 2017-11-09 DIAGNOSIS — E119 Type 2 diabetes mellitus without complications: Secondary | ICD-10-CM | POA: Diagnosis not present

## 2017-11-09 DIAGNOSIS — R278 Other lack of coordination: Secondary | ICD-10-CM | POA: Diagnosis not present

## 2017-11-09 DIAGNOSIS — R5383 Other fatigue: Secondary | ICD-10-CM | POA: Diagnosis not present

## 2017-11-09 DIAGNOSIS — K219 Gastro-esophageal reflux disease without esophagitis: Secondary | ICD-10-CM

## 2017-11-09 LAB — BAYER DCA HB A1C WAIVED: HB A1C (BAYER DCA - WAIVED): 7.2 % — ABNORMAL HIGH (ref <7.0)

## 2017-11-09 MED ORDER — LISINOPRIL 20 MG PO TABS: 20.0000 mg | ORAL_TABLET | Freq: Every day | ORAL | 1 refills | Status: DC

## 2017-11-09 MED ORDER — METFORMIN HCL 500 MG PO TABS: 1000.0000 mg | ORAL_TABLET | Freq: Two times a day (BID) | ORAL | 1 refills | Status: DC

## 2017-11-09 MED ORDER — OMEPRAZOLE 20 MG PO CPDR: 20.0000 mg | DELAYED_RELEASE_CAPSULE | Freq: Every day | ORAL | 3 refills | Status: DC

## 2017-11-09 NOTE — Assessment & Plan Note (Signed)
Start Omeprazole and see if symptoms resolve.  If not, refer to GI

## 2017-11-09 NOTE — Assessment & Plan Note (Signed)
Hgb A1C is 7.2% Increase Metformin from 1,000 mg to 2000 mg/day

## 2017-11-09 NOTE — Assessment & Plan Note (Signed)
Check lipid panel and adjust statin as needed 

## 2017-11-09 NOTE — Assessment & Plan Note (Addendum)
Chronic and worsening lower extremity stumbling.  Refer to Neurology

## 2017-11-09 NOTE — Assessment & Plan Note (Signed)
Pt having periods of hypotension.  Decrease Lisinopril from 40 mg to 20 mg

## 2017-11-09 NOTE — Progress Notes (Signed)
BP 130/86   Pulse (!) 56   Temp 98.3 F (36.8 C) (Oral)   Ht 5\' 6"  (1.676 m)   Wt 186 lb 9.6 oz (84.6 kg)   SpO2 97%   BMI 30.12 kg/m    Subjective:    Patient ID: Lawrence Jensen, male    DOB: 15-Jul-1950, 67 y.o.   MRN: [id number]  HPI: Lawrence Jensen is a 67 y.o. male  Chief Complaint  Patient presents with  . Medicare Wellness  . Diabetes    pt states last eye exam in chart is correct  . Stumbling    pt states he has had trouble picking up his feet and stumbling for years, states it is becoming bothersome  . Dysphagia    pt states he has been having a cramp at the top of his stomach when swallowing his food, states it has gotten worse since January visit with us  . Fatigue    pt states he has been getting very tired and wore out after working some days    Diabetes: Using medications without difficulties No hypoglycemic episodes No hyperglycemic episodes Feet problems: none Blood Sugars averaging: 160-170 eye exam within last year Last Hgb A1C: 6.2  Hypertension  Using medications without difficulty Average home BPs 115-120/70 in the AM   Using medication without problems or lightheadedness No chest pain with exertion or shortness of breath No Edema  Elevated Cholesterol Using medications without problems No Muscle aches  Diet: Exercise:  Fatigue Pt states he gets fatigued easily.  Last time he was fatigued and noted SBP of less than 100.  No chest pain or SOB.  Able to climb up hills without problem.    Stumbling Pt states he has always had this problem.  Feels this is getting worse.  No dizzyness.  States he just can't pick his feet up.    Reflux Sometimes when he eats, food just "stops" and can have pain epigastric area.  No other type of abdominal pain  Social History   Socioeconomic History  . Marital status: Married    Spouse name: Not on file  . Number of children: Not on file  . Years of education: Not on file  . Highest education  level: Not on file  Occupational History  . Not on file  Social Needs  . Financial resource strain: Not on file  . Food insecurity:    Worry: Not on file    Inability: Not on file  . Transportation needs:    Medical: Not on file    Non-medical: Not on file  Tobacco Use  . Smoking status: Former Smoker    Packs/day: 1.00    Years: 35.00    Pack years: 35.00    Types: Cigarettes    Last attempt to quit: 04/03/1995    Years since quitting: 22.6  . Smokeless tobacco: Never Used  Substance and Sexual Activity  . Alcohol use: No  . Drug use: No  . Sexual activity: Never  Lifestyle  . Physical activity:    Days per week: Not on file    Minutes per session: Not on file  . Stress: Not on file  Relationships  . Social connections:    Talks on phone: Not on file    Gets together: Not on file    Attends religious service: Not on file    Active member of club or organization: Not on file    Attends meetings of clubs or organizations:  Not on file    Relationship status: Not on file  . Intimate partner violence:    Fear of current or ex partner: Not on file    Emotionally abused: Not on file    Physically abused: Not on file    Forced sexual activity: Not on file  Other Topics Concern  . Not on file  Social History Narrative  . Not on file   Family History  Problem Relation Age of Onset  . Heart disease Mother   . Heart disease Father   . Congestive Heart Failure Father   . Cancer Father        prostate  . Diabetes Father   . Stroke Father   . Hypertension Father   . Cancer Brother   . Stroke Maternal Grandmother   . Heart disease Brother   . COPD Neg Hx    Past Medical History:  Diagnosis Date  . Diabetes mellitus without complication (HCC)   . ED (erectile dysfunction)   . Hyperlipidemia   . Hypertension    Past Surgical History:  Procedure Laterality Date  . APPENDECTOMY  1999    Relevant past medical, surgical, family and social history reviewed and  updated as indicated. Interim medical history since our last visit reviewed. Allergies and medications reviewed and updated.  Review of Systems  Constitutional: Positive for fatigue.  HENT: Negative.   Eyes: Negative.   Respiratory: Negative.   Cardiovascular: Negative.   Gastrointestinal: Negative.   Endocrine: Negative.   Genitourinary: Negative.   Musculoskeletal: Negative.   Skin: Negative.   Allergic/Immunologic: Negative.   Neurological: Negative.   Hematological: Negative.   Psychiatric/Behavioral: Negative.     Per HPI unless specifically indicated above     Objective:    BP 130/86   Pulse (!) 56   Temp 98.3 F (36.8 C) (Oral)   Ht 5\' 6"  (1.676 m)   Wt 186 lb 9.6 oz (84.6 kg)   SpO2 97%   BMI 30.12 kg/m   Wt Readings from Last 3 Encounters:  11/09/17 186 lb 9.6 oz (84.6 kg)  10/28/17 185 lb 9.6 oz (84.2 kg)  05/28/17 186 lb (84.4 kg)    Physical Exam  Constitutional: He is oriented to person, place, and time. He appears well-developed and well-nourished. No distress.  HENT:  Head: Normocephalic and atraumatic.  Right Ear: Tympanic membrane, external ear and ear canal normal.  Left Ear: Tympanic membrane, external ear and ear canal normal.  Mouth/Throat: Uvula is midline, oropharynx is clear and moist and mucous membranes are normal.  Eyes: Pupils are equal, round, and reactive to light. Conjunctivae and lids are normal. Right eye exhibits no discharge. Left eye exhibits no discharge. No scleral icterus.  Neck: Normal range of motion. Neck supple. No JVD present. Carotid bruit is not present.  Cardiovascular: Normal rate, regular rhythm and normal heart sounds. Exam reveals no gallop and no friction rub.  No murmur heard. Pulmonary/Chest: Effort normal and breath sounds normal. No respiratory distress.  Abdominal: Soft. Normal appearance and bowel sounds are normal. He exhibits no distension. There is no splenomegaly or hepatomegaly. There is no tenderness.    Genitourinary: Rectum normal and prostate normal.  Musculoskeletal: Normal range of motion.  Neurological: He is alert and oriented to person, place, and time. He has normal reflexes. He displays no atrophy, no tremor and normal reflexes. No cranial nerve deficit or sensory deficit. He exhibits normal muscle tone. He displays a negative Romberg sign.  Unable to  heel toe walk without stumbling  Skin: Skin is warm, dry and intact. No rash noted. No pallor.  Psychiatric: He has a normal mood and affect. His behavior is normal. Judgment and thought content normal.   Diabetic Foot Exam - Simple   Simple Foot Form Diabetic Foot exam was performed with the following findings:  Yes 11/09/2017  9:00 AM  Visual Inspection See comments:  Yes Sensation Testing Intact to touch and monofilament testing bilaterally:  Yes Pulse Check Posterior Tibialis and Dorsalis pulse intact bilaterally:  Yes Comments Discolored left large nail.      Results for orders placed or performed in visit on 10/28/17  Urinalysis, Complete  Result Value Ref Range   Specific Gravity, UA >1.030 (H) 1.005 - 1.030   pH, UA 5.0 5.0 - 7.5   Color, UA Yellow Yellow   Appearance Ur Clear Clear   Leukocytes, UA Negative Negative   Protein, UA Negative Negative/Trace   Glucose, UA Negative Negative   Ketones, UA Negative Negative   RBC, UA Negative Negative   Bilirubin, UA Negative Negative   Urobilinogen, Ur 0.2 0.2 - 1.0 mg/dL   Nitrite, UA Negative Negative      Assessment & Plan:   Problem List Items Addressed This Visit      Unprioritized   Coordination abnormal    Chronic and worsening lower extremity stumbling.  Refer to Neurology      Relevant Orders   Ambulatory referral to Neurology   Diabetes mellitus without complication (HCC) - Primary    Hgb A1C is 7.2% Increase Metformin from 1,000 mg to 2000 mg/day      Relevant Medications   lisinopril (PRINIVIL,ZESTRIL) 20 MG tablet   metFORMIN (GLUCOPHAGE)  500 MG tablet   Other Relevant Orders   Bayer DCA Hb A1c Waived   GERD with stricture    Start Omeprazole and see if symptoms resolve.  If not, refer to GI      Relevant Medications   omeprazole (PRILOSEC) 20 MG capsule   Hyperlipidemia    Check lipid panel and adjust statin as needed      Relevant Medications   lisinopril (PRINIVIL,ZESTRIL) 20 MG tablet   Hypertension    Pt having periods of hypotension.  Decrease Lisinopril from 40 mg to 20 mg      Relevant Medications   lisinopril (PRINIVIL,ZESTRIL) 20 MG tablet   Other Relevant Orders   Comprehensive metabolic panel   Lipid Panel w/o Chol/HDL Ratio    Other Visit Diagnoses    Fatigue, unspecified type       Fatigue with weaknesss.  Accompanied by hypotension.  Will decrease Lisinopril form 40 mg to 20 mg.  Check CBC and TSH   Relevant Orders   CBC with Differential/Platelet   TSH   Prostate cancer screening       Relevant Orders   PSA   Annual physical exam          Greater than 50% of this  40 minute visit was spent in counseling/coordination of care regarding    Follow up plan: Return in about 3 months (around 02/09/2018).

## 2017-11-10 ENCOUNTER — Telehealth: Payer: Self-pay | Admitting: Unknown Physician Specialty

## 2017-11-10 LAB — COMPREHENSIVE METABOLIC PANEL
ALBUMIN: 4.7 g/dL (ref 3.6–4.8)
ALT: 20 IU/L (ref 0–44)
AST: 17 IU/L (ref 0–40)
Albumin/Globulin Ratio: 2.1 (ref 1.2–2.2)
Alkaline Phosphatase: 77 IU/L (ref 39–117)
BUN / CREAT RATIO: 31 — AB (ref 10–24)
BUN: 35 mg/dL — AB (ref 8–27)
Bilirubin Total: 0.8 mg/dL (ref 0.0–1.2)
CO2: 21 mmol/L (ref 20–29)
CREATININE: 1.13 mg/dL (ref 0.76–1.27)
Calcium: 9.8 mg/dL (ref 8.6–10.2)
Chloride: 103 mmol/L (ref 96–106)
GFR, EST AFRICAN AMERICAN: 78 mL/min/{1.73_m2} (ref >59)
GFR, EST NON AFRICAN AMERICAN: 67 mL/min/{1.73_m2} (ref >59)
GLOBULIN, TOTAL: 2.2 g/dL (ref 1.5–4.5)
GLUCOSE: 154 mg/dL — AB (ref 65–99)
Potassium: 4.8 mmol/L (ref 3.5–5.2)
SODIUM: 139 mmol/L (ref 134–144)
TOTAL PROTEIN: 6.9 g/dL (ref 6.0–8.5)

## 2017-11-10 LAB — CBC WITH DIFFERENTIAL/PLATELET
BASOS ABS: 0 10*3/uL (ref 0.0–0.2)
Basos: 1 %
EOS (ABSOLUTE): 0.2 10*3/uL (ref 0.0–0.4)
EOS: 3 %
HEMATOCRIT: 39 % (ref 37.5–51.0)
HEMOGLOBIN: 13.2 g/dL (ref 13.0–17.7)
IMMATURE GRANS (ABS): 0 10*3/uL (ref 0.0–0.1)
Immature Granulocytes: 0 %
LYMPHS ABS: 1.1 10*3/uL (ref 0.7–3.1)
LYMPHS: 21 %
MCH: 30.3 pg (ref 26.6–33.0)
MCHC: 33.8 g/dL (ref 31.5–35.7)
MCV: 89 fL (ref 79–97)
MONOCYTES: 8 %
Monocytes Absolute: 0.4 10*3/uL (ref 0.1–0.9)
NEUTROS ABS: 3.5 10*3/uL (ref 1.4–7.0)
Neutrophils: 67 %
Platelets: 207 10*3/uL (ref 150–450)
RBC: 4.36 x10E6/uL (ref 4.14–5.80)
RDW: 13.1 % (ref 12.3–15.4)
WBC: 5.2 10*3/uL (ref 3.4–10.8)

## 2017-11-10 LAB — TSH: TSH: 2.33 u[IU]/mL (ref 0.450–4.500)

## 2017-11-10 LAB — PSA: PROSTATE SPECIFIC AG, SERUM: 0.4 ng/mL (ref 0.0–4.0)

## 2017-11-10 LAB — LIPID PANEL W/O CHOL/HDL RATIO
CHOLESTEROL TOTAL: 196 mg/dL (ref 100–199)
HDL: 41 mg/dL (ref >39)
LDL CALC: 125 mg/dL — AB (ref 0–99)
Triglycerides: 150 mg/dL — ABNORMAL HIGH (ref 0–149)
VLDL CHOLESTEROL CAL: 30 mg/dL (ref 5–40)

## 2017-11-10 MED ORDER — ROSUVASTATIN CALCIUM 40 MG PO TABS: 40.0000 mg | ORAL_TABLET | Freq: Every day | ORAL | 1 refills | Status: DC

## 2017-11-10 NOTE — Telephone Encounter (Signed)
Discussed with wife.  Called in Crestor 40 mg.

## 2017-11-12 ENCOUNTER — Telehealth: Payer: Self-pay | Admitting: Unknown Physician Specialty

## 2017-11-12 DIAGNOSIS — I10 Essential (primary) hypertension: Secondary | ICD-10-CM

## 2017-11-12 MED ORDER — AMLODIPINE BESYLATE 5 MG PO TABS: 5.0000 mg | ORAL_TABLET | Freq: Every day | ORAL | 0 refills | Status: DC

## 2017-11-12 NOTE — Telephone Encounter (Signed)
LVM that medication was sent to pharmacy. DPR reviewed. 

## 2017-11-12 NOTE — Telephone Encounter (Signed)
90 days amlodipine sent to [facility].

## 2017-11-12 NOTE — Progress Notes (Deleted)
11/15/2017 4:12 PM   Janeece Riggersavid Clark Strickling 18-Feb-1951 [id number]  Referring provider: [name], [name], NP [address] [place], [place] [id number]  No chief complaint on file.   HPI: Patient is a 67 year old Caucasian male with ED who presents today for intracavernosal injection for titration and teaching.   He saw Dr. [name] in February 2019 for ED and voiding symptoms.  He had previously failed PDE 5 inhibitors.  Other treatment options were discussed and he purchased a vacuum erection device however states it has not been effective.  He is interested in pursuing intracavernosal injections.  Today, ***.  BP is ***.   He denies any curvature or pain with erections.     PMH: Past Medical History:  Diagnosis Date  . Diabetes mellitus without complication (HCC)   . ED (erectile dysfunction)   . Hyperlipidemia   . Hypertension     Surgical History: Past Surgical History:  Procedure Laterality Date  . APPENDECTOMY  1999    Home Medications:  Allergies as of 11/15/2017   No Known Allergies     Medication List        Accurate as of 11/12/17  4:12 PM. Always use your most recent med list.          amLODipine 5 MG tablet Commonly known as:  NORVASC Take 1 tablet (5 mg total) by mouth daily.   aspirin EC 81 MG tablet Take 81 mg by mouth daily.   lisinopril 20 MG tablet Commonly known as:  PRINIVIL,ZESTRIL Take 1 tablet (20 mg total) by mouth daily.   metFORMIN 500 MG tablet Commonly known as:  GLUCOPHAGE Take 2 tablets (1,000 mg total) by mouth 2 (two) times daily with a meal.   omeprazole 20 MG capsule Commonly known as:  PRILOSEC Take 1 capsule (20 mg total) by mouth daily.   rosuvastatin 40 MG tablet Commonly known as:  CRESTOR Take 1 tablet (40 mg total) by mouth daily.       Allergies: No Known Allergies  Family History: Family History  Problem Relation Age of Onset  . Heart disease Mother   . Heart disease Father   . Congestive Heart Failure Father   .  Cancer Father        prostate  . Diabetes Father   . Stroke Father   . Hypertension Father   . Cancer Brother   . Stroke Maternal Grandmother   . Heart disease Brother   . COPD Neg Hx     Social History:  reports that he quit smoking about 22 years ago. His smoking use included cigarettes. He has a 35.00 pack-year smoking history. He has never used smokeless tobacco. He reports that he does not drink alcohol or use drugs.  ROS:                                        Physical Exam: There were no vitals taken for this visit.  Constitutional: Well nourished. Alert and oriented, No acute distress. HEENT: [place] AT, moist mucus membranes. Trachea midline, no masses. Cardiovascular: No clubbing, cyanosis, or edema. Respiratory: Normal respiratory effort, no increased work of breathing. GI: Abdomen is soft, non tender, non distended, no abdominal masses. Liver and spleen not palpable.  No hernias appreciated.  Stool sample for occult testing is not indicated.   GU: No CVA tenderness.  No bladder fullness or masses.  Patient  with circumcised/uncircumcised phallus. ***Foreskin easily retracted***  Urethral meatus is patent.  No penile discharge. No penile lesions or rashes.  Skin: No rashes, bruises or suspicious lesions. Lymph: No cervical or inguinal adenopathy. Neurologic: Grossly intact, no focal deficits, moving all 4 extremities. Psychiatric: Normal mood and affect.  Procedure Patient's left corpus cavernosum is identified.  An area near the base of the penis is cleansed with rubbing alcohol.  Careful to avoid the dorsal vein, 20 mcg of alprostadil is injected at a 90 degree angle into the left corpus cavernosum near the base of the penis.  Patient experienced a very firm erection in 15 minutes.  The curvature is measured at 40 degrees dorsally and the plaque is identified and marked with a marking pen.  The plaque appears to be 20 mm x 15 mm in size on the dorsal  side of the penis, 1 cm from the penile base.  It is causing a slight hour glass deformity.  ***  Patient's left corpus cavernosum is identified.  An area near the base of the penis is cleansed with rubbing alcohol.  Careful to avoid the dorsal vein, 2 mcg of Trimix (papaverine 30 mg, phentolamine 1 mg and prostaglandin E1 10 mcg, Lot # [id number]$  exp # 01/14/2017) is injected at a 90 degree angle into the left corpus cavernosum near the base of the penis.  Patient experienced a very firm erection in 15 minutes.     Patient presents today for instructions on Trimix injections and to demonstrate his proficiency with the injections.  The patient washed his hands.  He then was instructed to tap the syringe to allow air bubbles to float to the top of the syringe. He then depressed the plunger to allow the air to escape.  He then selected an injection site at the right base of his penis between 9 and 11:00 positions between the base and midportion of the penis. He was careful to avoid the 6 and 12:00 positions and any surface veins or arteries.  He then grafts the head of the penis towards the left with light tension.  He then cleansed the area using an alcohol swab. He then took the syringe and injected at a 90 angle into the corpus cavernosum 5 g of Trimix.  He then applied compression to the injection site.  He tolerated the procedure well. He stated he had good comfort level with the process of the injections.       Assessment & Plan:    1. Erectile dysfunction Satisfactory erection achieved with *** of Trimix Will send in a prescription for Trimix *** Advised patient of the condition of priapism, painful erection lasting for more than four hours, and to contact the office immediately or seek treatment in the ED    [name], [facility] [address] [place], [place] [address] [phone]

## 2017-11-12 NOTE — Telephone Encounter (Signed)
Copied from CRM #[id number]. Topic: Quick Communication - Rx Refill/Question >> Nov 12, 2017  3:20 PM Oneal GroutSebastian, Jennifer S wrote: Medication: amLODipine (NORVASC) 5 MG tablet   Has the patient contacted their pharmacy? Yes.   (Agent: If no, request that the patient contact the pharmacy for the refill.) (Agent: If yes, when and what did the pharmacy advise?)  Preferred Pharmacy (with phone number or street name): [facility]  Agent: Please be advised that RX refills may take up to 3 business days. We ask that you follow-up with your pharmacy.

## 2017-11-15 ENCOUNTER — Ambulatory Visit: Payer: Medicare Other | Admitting: Urology

## 2017-11-16 ENCOUNTER — Telehealth: Payer: Self-pay | Admitting: Urology

## 2017-11-16 ENCOUNTER — Encounter: Payer: Self-pay | Admitting: Urology

## 2017-11-16 ENCOUNTER — Ambulatory Visit: Payer: Medicare Other | Admitting: Urology

## 2017-11-16 VITALS — BP 118/64 | HR 60 | Ht 66.0 in | Wt 187.7 lb

## 2017-11-16 DIAGNOSIS — N5201 Erectile dysfunction due to arterial insufficiency: Secondary | ICD-10-CM

## 2017-11-16 NOTE — Progress Notes (Signed)
11/16/2017 8:31 AM   Janeece Riggers [place] Dec 10, 1950 [id number]  Referring provider: Gabriel Cirri, NP [address] [place], [place] [address]  Chief Complaint  Patient presents with  . Erectile Dysfunction    HPI: Patient is a 67 year old Caucasian male with ED who presents today for intracavernosal injection for titration and teaching.   He saw Dr. Sherryl Barters in February 2019 for ED and voiding symptoms.  He had previously failed PDE 5 inhibitors.  Other treatment options were discussed and he purchased a vacuum erection device however states it has not been effective.  He is interested in pursuing intracavernosal injections.  Today, he has no complaints.   Patient denies any gross hematuria, dysuria or suprapubic/flank pain.  Patient denies any fevers, chills, nausea or vomiting.  BP is 118/64.   He denies any curvature or pain with erections.    PMH: Past Medical History:  Diagnosis Date  . Diabetes mellitus without complication (HCC)   . ED (erectile dysfunction)   . Hyperlipidemia   . Hypertension     Surgical History: Past Surgical History:  Procedure Laterality Date  . APPENDECTOMY  1999    Home Medications:  Allergies as of 11/16/2017   No Known Allergies     Medication List        Accurate as of 11/16/17  8:31 AM. Always use your most recent med list.          amLODipine 5 MG tablet Commonly known as:  NORVASC Take 1 tablet (5 mg total) by mouth daily.   aspirin EC 81 MG tablet Take 81 mg by mouth daily.   lisinopril 20 MG tablet Commonly known as:  PRINIVIL,ZESTRIL Take 1 tablet (20 mg total) by mouth daily.   metFORMIN 500 MG tablet Commonly known as:  GLUCOPHAGE Take 2 tablets (1,000 mg total) by mouth 2 (two) times daily with a meal.   omeprazole 20 MG capsule Commonly known as:  PRILOSEC Take 1 capsule (20 mg total) by mouth daily.   rosuvastatin 40 MG tablet Commonly known as:  CRESTOR Take 1 tablet (40 mg total) by mouth daily.        Allergies: No Known Allergies  Family History: Family History  Problem Relation Age of Onset  . Heart disease Mother   . Heart disease Father   . Congestive Heart Failure Father   . Cancer Father        prostate  . Diabetes Father   . Stroke Father   . Hypertension Father   . Cancer Brother   . Stroke Maternal Grandmother   . Heart disease Brother   . COPD Neg Hx     Social History:  reports that he quit smoking about 22 years ago. His smoking use included cigarettes. He has a 35.00 pack-year smoking history. He has never used smokeless tobacco. He reports that he does not drink alcohol or use drugs.  ROS: UROLOGY Frequent Urination?: No Hard to postpone urination?: No Burning/pain with urination?: No Get up at night to urinate?: No Leakage of urine?: No Urine stream starts and stops?: No Trouble starting stream?: No Do you have to strain to urinate?: No Blood in urine?: No Urinary tract infection?: No Sexually transmitted disease?: No Injury to kidneys or bladder?: No Painful intercourse?: No Weak stream?: No Erection problems?: Yes Penile pain?: No  Gastrointestinal Nausea?: No Vomiting?: No Indigestion/heartburn?: No Diarrhea?: No Constipation?: No  Constitutional Fever: No Night sweats?: No Weight loss?: No Fatigue?: No  Skin Skin  rash/lesions?: No Itching?: No  Eyes Blurred vision?: No Double vision?: No  Ears/Nose/Throat Sore throat?: No Sinus problems?: No  Hematologic/Lymphatic Swollen glands?: No Easy bruising?: No  Cardiovascular Leg swelling?: No Chest pain?: No  Respiratory Cough?: No Shortness of breath?: No  Endocrine Excessive thirst?: No  Musculoskeletal Back pain?: No Joint pain?: No  Neurological Headaches?: No Dizziness?: No  Psychologic Depression?: No Anxiety?: No  Physical Exam: BP 118/64 (BP Location: Left Arm, Patient Position: Sitting, Cuff Size: Normal)   Pulse 60   Ht 5\' 6"  (1.676 m)   Wt  187 lb 11.2 oz (85.1 kg)   BMI 30.30 kg/m   Constitutional: Well nourished. Alert and oriented, No acute distress. HEENT: [place] AT, moist mucus membranes. Trachea midline, no masses. Cardiovascular: No clubbing, cyanosis, or edema. Respiratory: Normal respiratory effort, no increased work of breathing. GI: Abdomen is soft, non tender, non distended, no abdominal masses. Liver and spleen not palpable.  No hernias appreciated.  Stool sample for occult testing is not indicated.   GU: No CVA tenderness.  No bladder fullness or masses.  Patient with uncircumcised phallus.  Foreskin easily retracted Urethral meatus is patent.  No penile discharge. No penile lesions or rashes.  Skin: No rashes, bruises or suspicious lesions. Lymph: No cervical or inguinal adenopathy. Neurologic: Grossly intact, no focal deficits, moving all 4 extremities. Psychiatric: Normal mood and affect.  Procedure Patient's left corpus cavernosum is identified.  An area near the base of the penis is cleansed with rubbing alcohol.  Careful to avoid the dorsal vein, 2 mcg of Trimix (papaverine 30 mg, phentolamine 1 mg and prostaglandin E1 10 mcg, Lot # [id number]  exp # 12/25/2017) is injected at a 90 degree angle into the left corpus cavernosum near the base of the penis.  Patient experienced a soft erection in 15 minutes.  We then elected to inject 1 mcg into the right corpus cavernosum.  After 15 minutes he experienced a satisfactory erection.  Assessment & Plan:    1. Erectile dysfunction Satisfactory erection achieved with 3 mcg of Trimix Will send in a prescription for Trimix 4 mcg Advised patient of the condition of priapism, painful erection lasting for more than four hours, and to contact the office immediately or seek treatment in the ED   Michiel CowboySHANNON Naziyah Tieszen, [facility] [address], [address] [place], [place] [address] [phone]

## 2017-11-16 NOTE — Telephone Encounter (Signed)
Would you call in a script for Trimix for this patient?  He would like the vial.  His dose is 4 mcg.

## 2017-11-17 ENCOUNTER — Ambulatory Visit (INDEPENDENT_AMBULATORY_CARE_PROVIDER_SITE_OTHER): Payer: Medicare Other

## 2017-11-17 VITALS — BP 120/62 | HR 80 | Temp 98.5°F | Resp 18 | Ht 66.0 in | Wt 187.7 lb

## 2017-11-17 DIAGNOSIS — Z0000 Encounter for general adult medical examination without abnormal findings: Secondary | ICD-10-CM

## 2017-11-17 NOTE — Patient Instructions (Addendum)
[name] , Thank you for taking time to come for your Medicare Wellness Visit. I appreciate your ongoing commitment to your health goals. Please review the following plan we discussed and let me know if I can assist you in the future.   Screening recommendations/referrals: Colonoscopy: due now- declined  Recommended yearly ophthalmology/optometry visit for glaucoma screening and checkup Recommended yearly dental visit for hygiene and checkup  Vaccinations: Influenza vaccine: due 12/2017 Pneumococcal vaccine: declined  Tdap vaccine: up to date  Shingles vaccine: shingrix eligible, check with your insurance company for coverage.  Advanced directives: Advance directive discussed with you today. I have provided a copy for you to complete at home and have notarized. Once this is complete please bring a copy in to our office so we can scan it into your chart.  Conditions/risks identified: Recommend drinking at least 6-8 glasses of water a day   Next appointment: Follow up on 02/08/2018 at 8:15am with Jolene Cannady,NP. Follow up in one year for your annual wellness exam.   Preventive Care 66 Years and Older, Male Preventive care refers to lifestyle choices and visits with your health care provider that can promote health and wellness. What does preventive care include?  A yearly physical exam. This is also called an annual well check.  Dental exams once or twice a year.  Routine eye exams. Ask your health care provider how often you should have your eyes checked.  Personal lifestyle choices, including:  Daily care of your teeth and gums.  Regular physical activity.  Eating a healthy diet.  Avoiding tobacco and drug use.  Limiting alcohol use.  Practicing safe sex.  Taking low doses of aspirin every day.  Taking vitamin and mineral supplements as recommended by your health care provider. What happens during an annual well check? The services and screenings done by your  health care provider during your annual well check will depend on your age, overall health, lifestyle risk factors, and family history of disease. Counseling  Your health care provider may ask you questions about your:  Alcohol use.  Tobacco use.  Drug use.  Emotional well-being.  Home and relationship well-being.  Sexual activity.  Eating habits.  History of falls.  Memory and ability to understand (cognition).  Work and work Astronomer. Screening  You may have the following tests or measurements:  Height, weight, and BMI.  Blood pressure.  Lipid and cholesterol levels. These may be checked every 5 years, or more frequently if you are over 75 years old.  Skin check.  Lung cancer screening. You may have this screening every year starting at age 22 if you have a 30-pack-year history of smoking and currently smoke or have quit within the past 15 years.  Fecal occult blood test (FOBT) of the stool. You may have this test every year starting at age 50.  Flexible sigmoidoscopy or colonoscopy. You may have a sigmoidoscopy every 5 years or a colonoscopy every 10 years starting at age 42.  Prostate cancer screening. Recommendations will vary depending on your family history and other risks.  Hepatitis C blood test.  Hepatitis B blood test.  Sexually transmitted disease (STD) testing.  Diabetes screening. This is done by checking your blood sugar (glucose) after you have not eaten for a while (fasting). You may have this done every 1-3 years.  Abdominal aortic aneurysm (AAA) screening. You may need this if you are a current or former smoker.  Osteoporosis. You may be screened starting at age 20  if you are at high risk. Talk with your health care provider about your test results, treatment options, and if necessary, the need for more tests. Vaccines  Your health care provider may recommend certain vaccines, such as:  Influenza vaccine. This is recommended every  year.  Tetanus, diphtheria, and acellular pertussis (Tdap, Td) vaccine. You may need a Td booster every 10 years.  Zoster vaccine. You may need this after age 57.  Pneumococcal 13-valent conjugate (PCV13) vaccine. One dose is recommended after age 52.  Pneumococcal polysaccharide (PPSV23) vaccine. One dose is recommended after age 45. Talk to your health care provider about which screenings and vaccines you need and how often you need them. This information is not intended to replace advice given to you by your health care provider. Make sure you discuss any questions you have with your health care provider. Document Released: 04/26/2015 Document Revised: 12/18/2015 Document Reviewed: 01/29/2015 Elsevier Interactive Patient Education  2017 [place] Prevention in the Home Falls can cause injuries. They can happen to people of all ages. There are many things you can do to make your home safe and to help prevent falls. What can I do on the outside of my home?  Regularly fix the edges of walkways and driveways and fix any cracks.  Remove anything that might make you trip as you walk through a door, such as a raised step or threshold.  Trim any bushes or trees on the path to your home.  Use bright outdoor lighting.  Clear any walking paths of anything that might make someone trip, such as rocks or tools.  Regularly check to see if handrails are loose or broken. Make sure that both sides of any steps have handrails.  Any raised decks and porches should have guardrails on the edges.  Have any leaves, snow, or ice cleared regularly.  Use sand or salt on walking paths during winter.  Clean up any spills in your garage right away. This includes oil or grease spills. What can I do in the bathroom?  Use night lights.  Install grab bars by the toilet and in the tub and shower. Do not use towel bars as grab bars.  Use non-skid mats or decals in the tub or shower.  If you  need to sit down in the shower, use a plastic, non-slip stool.  Keep the floor dry. Clean up any water that spills on the floor as soon as it happens.  Remove soap buildup in the tub or shower regularly.  Attach bath mats securely with double-sided non-slip rug tape.  Do not have throw rugs and other things on the floor that can make you trip. What can I do in the bedroom?  Use night lights.  Make sure that you have a light by your bed that is easy to reach.  Do not use any sheets or blankets that are too big for your bed. They should not hang down onto the floor.  Have a firm chair that has side arms. You can use this for support while you get dressed.  Do not have throw rugs and other things on the floor that can make you trip. What can I do in the kitchen?  Clean up any spills right away.  Avoid walking on wet floors.  Keep items that you use a lot in easy-to-reach places.  If you need to reach something above you, use a strong step stool that has a grab bar.  Keep electrical  cords out of the way.  Do not use floor polish or wax that makes floors slippery. If you must use wax, use non-skid floor wax.  Do not have throw rugs and other things on the floor that can make you trip. What can I do with my stairs?  Do not leave any items on the stairs.  Make sure that there are handrails on both sides of the stairs and use them. Fix handrails that are broken or loose. Make sure that handrails are as long as the stairways.  Check any carpeting to make sure that it is firmly attached to the stairs. Fix any carpet that is loose or worn.  Avoid having throw rugs at the top or bottom of the stairs. If you do have throw rugs, attach them to the floor with carpet tape.  Make sure that you have a light switch at the top of the stairs and the bottom of the stairs. If you do not have them, ask someone to add them for you. What else can I do to help prevent falls?  Wear shoes  that:  Do not have high heels.  Have rubber bottoms.  Are comfortable and fit you well.  Are closed at the toe. Do not wear sandals.  If you use a stepladder:  Make sure that it is fully opened. Do not climb a closed stepladder.  Make sure that both sides of the stepladder are locked into place.  Ask someone to hold it for you, if possible.  Clearly mark and make sure that you can see:  Any grab bars or handrails.  First and last steps.  Where the edge of each step is.  Use tools that help you move around (mobility aids) if they are needed. These include:  Canes.  Walkers.  Scooters.  Crutches.  Turn on the lights when you go into a dark area. Replace any light bulbs as soon as they burn out.  Set up your furniture so you have a clear path. Avoid moving your furniture around.  If any of your floors are uneven, fix them.  If there are any pets around you, be aware of where they are.  Review your medicines with your doctor. Some medicines can make you feel dizzy. This can increase your chance of falling. Ask your doctor what other things that you can do to help prevent falls. This information is not intended to replace advice given to you by your health care provider. Make sure you discuss any questions you have with your health care provider. Document Released: 01/24/2009 Document Revised: 09/05/2015 Document Reviewed: 05/04/2014 Elsevier Interactive Patient Education  2017 [employer]  Your provider would like to you have your annual eye exam. Please contact your current eye doctor or here are some good options for you to contact.   [facility]        [facility] Address: [address], [place], [place] [address]   Address: [address], [place], [place] [address]  Phone: [phone]      Phone: [phone]  Website: [email or web address]    Website: [email or web address]     [facility]       [facility]  Address: [address], [place], [place] [address]   Address: [address], [place], [place] [address] Phone: [phone]      Phone: [phone]    [facility] Address: [address], [place], [place] [address]  Phone: [phone] 

## 2017-11-17 NOTE — Progress Notes (Signed)
Subjective:   Lawrence Jensen is a 67 y.o. male who presents for an Initial Medicare Annual Wellness Visit.  Review of Systems   Cardiac Risk Factors include: advanced age (>69men, >37 women);diabetes mellitus;hypertension;dyslipidemia;male gender;obesity (BMI >30kg/m2)    Objective:    Today's Vitals   11/17/17 1546  BP: 120/62  Pulse: 80  Resp: 18  Temp: 98.5 F (36.9 C)  TempSrc: Temporal  SpO2: 98%  Weight: 187 lb 11.2 oz (85.1 kg)  Height: 5\' 6"  (1.676 m)   Body mass index is 30.3 kg/m.  Advanced Directives 11/17/2017 02/02/2017 11/02/2016  Does Patient Have a Medical Advance Directive? No No No  Would patient like information on creating a medical advance directive? Yes (MAU/Ambulatory/Procedural Areas - Information given) - No - Patient declined    Current Medications (verified) Outpatient Encounter Medications as of 11/17/2017  Medication Sig  . amLODipine (NORVASC) 5 MG tablet Take 1 tablet (5 mg total) by mouth daily.  Marland Kitchen aspirin EC 81 MG tablet Take 81 mg by mouth daily.  Marland Kitchen lisinopril (PRINIVIL,ZESTRIL) 20 MG tablet Take 1 tablet (20 mg total) by mouth daily.  . metFORMIN (GLUCOPHAGE) 500 MG tablet Take 2 tablets (1,000 mg total) by mouth 2 (two) times daily with a meal.  . omeprazole (PRILOSEC) 20 MG capsule Take 1 capsule (20 mg total) by mouth daily.  . rosuvastatin (CRESTOR) 40 MG tablet Take 1 tablet (40 mg total) by mouth daily.   No facility-administered encounter medications on file as of 11/17/2017.     Allergies (verified) Patient has no known allergies.   History: Past Medical History:  Diagnosis Date  . Diabetes mellitus without complication (HCC)   . ED (erectile dysfunction)   . Hyperlipidemia   . Hypertension    Past Surgical History:  Procedure Laterality Date  . APPENDECTOMY  1999   Family History  Problem Relation Age of Onset  . Heart disease Mother   . Heart disease Father   . Congestive Heart Failure Father   . Cancer  Father        prostate  . Diabetes Father   . Stroke Father   . Hypertension Father   . Cancer Brother   . Stroke Maternal Grandmother   . Heart disease Brother   . COPD Neg Hx    Social History   Socioeconomic History  . Marital status: Married    Spouse name: Not on file  . Number of children: Not on file  . Years of education: Not on file  . Highest education level: Associate degree: academic program  Occupational History  . Not on file  Social Needs  . Financial resource strain: Not hard at all  . Food insecurity:    Worry: Never true    Inability: Never true  . Transportation needs:    Medical: No    Non-medical: No  Tobacco Use  . Smoking status: Former Smoker    Packs/day: 1.00    Years: 35.00    Pack years: 35.00    Types: Cigarettes    Last attempt to quit: 04/03/1995    Years since quitting: 22.6  . Smokeless tobacco: Never Used  Substance and Sexual Activity  . Alcohol use: No  . Drug use: No  . Sexual activity: Never  Lifestyle  . Physical activity:    Days per week: 0 days    Minutes per session: 0 min  . Stress: Not at all  Relationships  . Social connections:  Talks on phone: Twice a week    Gets together: More than three times a week    Attends religious service: More than 4 times per year    Active member of club or organization: No    Attends meetings of clubs or organizations: Never    Relationship status: Married  Other Topics Concern  . Not on file  Social History Narrative   Working part time    Tobacco Counseling Counseling given: Not Answered   Clinical Intake:  Pre-visit preparation completed: Yes  Pain : No/denies pain     Nutritional Status: BMI > 30  Obese Nutritional Risks: None Diabetes: Yes CBG done?: No Did pt. bring in CBG monitor from home?: No  How often do you need to have someone help you when you read instructions, pamphlets, or other written materials from your doctor or pharmacy?: 1 - Never What is  the last grade level you completed in school?: 2 years college - associates degree  Interpreter Needed?: No  Information entered by :: Horacio Werth,LPN   Activities of Daily Living In your present state of health, do you have any difficulty performing the following activities: 11/17/2017 11/09/2017  Hearing? N N  Vision? N N  Difficulty concentrating or making decisions? N N  Walking or climbing stairs? Y Y  Dressing or bathing? N N  Doing errands, shopping? N N  Preparing Food and eating ? N -  Using the Toilet? N -  In the past six months, have you accidently leaked urine? N -  Do you have problems with loss of bowel control? N -  Managing your Medications? N -  Managing your Finances? N -  Housekeeping or managing your Housekeeping? N -  Some recent data might be hidden     Immunizations and Health Maintenance Immunization History  Administered Date(s) Administered  . Tdap 01/02/2014   Health Maintenance Due  Topic Date Due  . COLONOSCOPY  07/21/2001  . OPHTHALMOLOGY EXAM  04/13/2013    Patient Care Team: Gabriel Cirri, NP as PCP - General (Nurse Practitioner)  Indicate any recent Medical Services you may have received from other than [facility] providers in the past year (date may be approximate).    Assessment:   This is a routine wellness examination for Emauri.  Hearing/Vision screen Vision Screening Comments: doesnt have an eye doctor - currently   Dietary issues and exercise activities discussed: Current Exercise Habits: The patient has a physically strenous job, but has no regular exercise apart from work.  Goals    . DIET - INCREASE WATER INTAKE     Recommend drinking at least 6-8 glasses of water a day       Depression Screen PHQ 2/9 Scores 11/17/2017 11/09/2017 11/02/2016  PHQ - 2 Score 0 0 0  PHQ- 9 Score 3 1 0    Fall Risk Fall Risk  11/17/2017 11/09/2017 11/02/2016  Falls in the past year? No No No    Is the patient's home free of loose throw rugs in  walkways, pet beds, electrical cords, etc?   yes      Grab bars in the bathroom? no      Handrails on the stairs?   yes      Adequate lighting?   yes  Timed Get Up and Go performed: Completed in 8 seconds with no use of assistive devices, steady gait. No intervention needed at this time.   Cognitive Function:     6CIT Screen 11/17/2017  What Year? 0 points  What month? 0 points  What time? 0 points  Count back from 20 0 points  Months in reverse 0 points  Repeat phrase 0 points  Total Score 0    Screening Tests Health Maintenance  Topic Date Due  . COLONOSCOPY  07/21/2001  . OPHTHALMOLOGY EXAM  04/13/2013  . INFLUENZA VACCINE  01/11/2018 (Originally 11/11/2017)  . Hepatitis C Screening  05/11/2018 (Originally Jan 11, 1951)  . PNA vac Low Risk Adult (1 of 2 - PCV13) 11/10/2018 (Originally 07/21/2016)  . HEMOGLOBIN A1C  05/12/2018  . FOOT EXAM  11/10/2018  . TETANUS/TDAP  01/03/2024    Qualifies for Shingles Vaccine?  Yes, discussed shingrix vaccine   Cancer Screenings: Lung: Low Dose CT Chest recommended if Age 11-80 years, 30 pack-year currently smoking OR have quit w/in 15years. Patient does not qualify. Colorectal: due now   Additional Screenings:  Hepatitis C Screening: due now       Plan:    I have personally reviewed and addressed the Medicare Annual Wellness questionnaire and have noted the following in the patient's chart:  A. Medical and social history B. Use of alcohol, tobacco or illicit drugs  C. Current medications and supplements D. Functional ability and status E.  Nutritional status F.  Physical activity G. Advance directives H. List of other physicians I.  Hospitalizations, surgeries, and ER visits in previous 12 months J.  Vitals K. Screenings such as hearing and vision if needed, cognitive and depression L. Referrals and appointments   In addition, I have reviewed and discussed with patient certain preventive protocols, quality metrics, and best  practice recommendations. A written personalized care plan for preventive services as well as general preventive health recommendations were provided to patient.   Signed,  Marin Robertsiffany Dinia Joynt, LPN Nurse Health Advisor   Nurse Notes:none

## 2017-12-14 ENCOUNTER — Telehealth: Payer: Self-pay | Admitting: Urology

## 2017-12-14 NOTE — Telephone Encounter (Signed)
Pt called office stating that he was advised to call the office if needed to speak to shannon, pt states medication isn't working for him, Pt did not know name of medication, asking for a call before lunch today, advised pt that [facility] and staff are in clinic and someone will be in touch when available, please advise pt at [phone]

## 2017-12-14 NOTE — Telephone Encounter (Signed)
Pt states his Trimix is not working, please advise.

## 2017-12-15 ENCOUNTER — Telehealth: Payer: Self-pay | Admitting: Urology

## 2017-12-15 NOTE — Telephone Encounter (Signed)
App has been scheduled for patient to come in and discuss why trimix is not working   MB

## 2017-12-20 ENCOUNTER — Encounter: Payer: Self-pay | Admitting: Urology

## 2017-12-20 ENCOUNTER — Telehealth: Payer: Self-pay | Admitting: Urology

## 2017-12-20 ENCOUNTER — Other Ambulatory Visit: Payer: Self-pay

## 2017-12-20 ENCOUNTER — Ambulatory Visit: Payer: Medicare Other | Admitting: Urology

## 2017-12-20 VITALS — BP 126/70 | HR 54 | Ht 66.0 in | Wt 182.4 lb

## 2017-12-20 DIAGNOSIS — N5201 Erectile dysfunction due to arterial insufficiency: Secondary | ICD-10-CM

## 2017-12-20 NOTE — Progress Notes (Signed)
12/20/2017 9:08 AM   Lawrence Jensen 1950/07/01 [id number]  Referring provider: Gabriel Cirri, NP [address], [place] [address]  Chief Complaint  Patient presents with  . Medication Problem    HPI: Patient is a 67 year old Caucasian male with ED who presents today for as he states Trimix is no longer effective.    He saw Dr. Sherryl Barters in February 2019 for ED and voiding symptoms.  He had previously failed PDE 5 inhibitors and vacuum erection device.      He states he had a good erections that lasted for 5 minutes with his test dose of Trimix in the office.  He tried two separate injections at home with no results.     PMH: Past Medical History:  Diagnosis Date  . Diabetes mellitus without complication (HCC)   . ED (erectile dysfunction)   . Hyperlipidemia   . Hypertension     Surgical History: Past Surgical History:  Procedure Laterality Date  . APPENDECTOMY  1999    Home Medications:  Allergies as of 12/20/2017   No Known Allergies     Medication List        Accurate as of 12/20/17  9:08 AM. Always use your most recent med list.          amLODipine 5 MG tablet Commonly known as:  NORVASC Take 1 tablet (5 mg total) by mouth daily.   aspirin EC 81 MG tablet Take 81 mg by mouth daily.   lisinopril 20 MG tablet Commonly known as:  PRINIVIL,ZESTRIL Take 1 tablet (20 mg total) by mouth daily.   metFORMIN 500 MG tablet Commonly known as:  GLUCOPHAGE Take 2 tablets (1,000 mg total) by mouth 2 (two) times daily with a meal.   omeprazole 20 MG capsule Commonly known as:  PRILOSEC Take 1 capsule (20 mg total) by mouth daily.   rosuvastatin 40 MG tablet Commonly known as:  CRESTOR Take 1 tablet (40 mg total) by mouth daily.       Allergies: No Known Allergies  Family History: Family History  Problem Relation Age of Onset  . Heart disease Mother   . Heart disease Father   . Congestive Heart Failure Father   . Cancer Father        prostate  .  Diabetes Father   . Stroke Father   . Hypertension Father   . Cancer Brother   . Stroke Maternal Grandmother   . Heart disease Brother   . COPD Neg Hx     Social History:  reports that he quit smoking about 22 years ago. His smoking use included cigarettes. He has a 35.00 pack-year smoking history. He has never used smokeless tobacco. He reports that he does not drink alcohol or use drugs.  ROS: UROLOGY Frequent Urination?: No Hard to postpone urination?: No Burning/pain with urination?: No Get up at night to urinate?: No Leakage of urine?: No Urine stream starts and stops?: No Trouble starting stream?: No Do you have to strain to urinate?: No Blood in urine?: No Urinary tract infection?: No Sexually transmitted disease?: No Injury to kidneys or bladder?: No Painful intercourse?: No Weak stream?: No Erection problems?: Yes Penile pain?: No  Gastrointestinal Nausea?: No Vomiting?: No Indigestion/heartburn?: No Diarrhea?: No Constipation?: No  Constitutional Fever: No Night sweats?: No Weight loss?: No Fatigue?: Yes  Skin Skin rash/lesions?: No Itching?: No  Eyes Blurred vision?: No Double vision?: No  Ears/Nose/Throat Sore throat?: No Sinus problems?: No  Hematologic/Lymphatic Swollen  glands?: No Easy bruising?: No  Cardiovascular Leg swelling?: No Chest pain?: No  Respiratory Cough?: No Shortness of breath?: No  Endocrine Excessive thirst?: No  Musculoskeletal Back pain?: No Joint pain?: No  Neurological Headaches?: No Dizziness?: No  Psychologic Depression?: No Anxiety?: No  Physical Exam: BP 126/70   Pulse (!) 54   Ht 5\' 6"  (1.676 m)   Wt 182 lb 6.4 oz (82.7 kg)   BMI 29.44 kg/m   Constitutional: Well nourished. Alert and oriented, No acute distress. HEENT: [place] AT, moist mucus membranes. Trachea midline, no masses. Cardiovascular: No clubbing, cyanosis, or edema. Respiratory: Normal respiratory effort, no increased work of  breathing. Skin: No rashes, bruises or suspicious lesions. Lymph: No cervical or inguinal adenopathy. Neurologic: Grossly intact, no focal deficits, moving all 4 extremities. Psychiatric: Normal mood and affect.  Assessment & Plan:    1. Erectile dysfunction He states he had no issue with injection and is him injecting in the correct location on his penis. Patient has not found the Trimix effective.  We discussed trying a higher strength Trimix or switching to Edex. He would like   Lawrence Jensen, [facility] [address] [place], [place] [address] [phone]  I spent 15 with this patient of which greater than 50% was spent in counseling and coordination of care with the patient.

## 2017-12-20 NOTE — Telephone Encounter (Signed)
Would you call in EDEX for patient?

## 2017-12-20 NOTE — Telephone Encounter (Signed)
faxed

## 2017-12-23 ENCOUNTER — Telehealth: Payer: Self-pay | Admitting: Urology

## 2017-12-23 NOTE — Telephone Encounter (Signed)
Please let Lawrence Jensen know that we have samples of the EDEX.  Has he heard from the pharmacy regarding what his cost would be for an EDEX prescription?

## 2017-12-24 NOTE — Telephone Encounter (Signed)
He states the pharmacy quoted 160.00 for 6 shots. He is fine with getting it from the pharmacy. He is scheduled to come in for his EDEX teaching

## 2017-12-29 ENCOUNTER — Telehealth: Payer: Self-pay | Admitting: Urology

## 2017-12-29 ENCOUNTER — Encounter: Payer: Self-pay | Admitting: Urology

## 2017-12-29 ENCOUNTER — Ambulatory Visit: Payer: Medicare Other | Admitting: Urology

## 2017-12-29 VITALS — BP 135/71 | HR 59 | Ht 66.0 in | Wt 184.8 lb

## 2017-12-29 DIAGNOSIS — N529 Male erectile dysfunction, unspecified: Secondary | ICD-10-CM

## 2017-12-29 NOTE — Progress Notes (Signed)
12/29/2017 9:47 AM   Lawrence Jensen 1950/09/30 [id number]  Referring provider: Gabriel Jensen, [name], NP [address] [place], [place] [id number]  Chief Complaint  Patient presents with  . Erectile Dysfunction    HPI: Patient is a 67 year old Caucasian male with ED who presents today for as he states Trimix is no longer effective and is here for an EDEX titration  He saw Dr. [name] in February 2019 for ED and voiding symptoms.  He had previously failed PDE 5 inhibitors and vacuum erection device.   He states he had a good erections that lasted for 5 minutes with his test dose of Trimix in the office.  He tried two separate injections at home with no results.     PMH: Past Medical History:  Diagnosis Date  . Diabetes mellitus without complication (HCC)   . ED (erectile dysfunction)   . Hyperlipidemia   . Hypertension     Surgical History: Past Surgical History:  Procedure Laterality Date  . APPENDECTOMY  1999    Home Medications:  Allergies as of 12/29/2017   No Known Allergies     Medication List        Accurate as of 12/29/17  9:47 AM. Always use your most recent med list.          amLODipine 5 MG tablet Commonly known as:  NORVASC Take 1 tablet (5 mg total) by mouth daily.   aspirin EC 81 MG tablet Take 81 mg by mouth daily.   lisinopril 20 MG tablet Commonly known as:  PRINIVIL,ZESTRIL Take 1 tablet (20 mg total) by mouth daily.   metFORMIN 500 MG tablet Commonly known as:  GLUCOPHAGE Take 2 tablets (1,000 mg total) by mouth 2 (two) times daily with a meal.   omeprazole 20 MG capsule Commonly known as:  PRILOSEC Take 1 capsule (20 mg total) by mouth daily.   rosuvastatin 40 MG tablet Commonly known as:  CRESTOR Take 1 tablet (40 mg total) by mouth daily.       Allergies: No Known Allergies  Family History: Family History  Problem Relation Age of Onset  . Heart disease Mother   . Heart disease Father   . Congestive Heart Failure Father   . Cancer  Father        prostate  . Diabetes Father   . Stroke Father   . Hypertension Father   . Cancer Brother   . Stroke Maternal Grandmother   . Heart disease Brother   . COPD Neg Hx     Social History:  reports that he quit smoking about 22 years ago. His smoking use included cigarettes. He has a 35.00 pack-year smoking history. He has never used smokeless tobacco. He reports that he does not drink alcohol or use drugs.  ROS: UROLOGY Frequent Urination?: No Hard to postpone urination?: No Burning/pain with urination?: No Get up at night to urinate?: No Leakage of urine?: No Urine stream starts and stops?: No Trouble starting stream?: No Do you have to strain to urinate?: No Blood in urine?: No Urinary tract infection?: No Sexually transmitted disease?: No Injury to kidneys or bladder?: No Painful intercourse?: No Weak stream?: No Erection problems?: Yes Penile pain?: No  Gastrointestinal Nausea?: No Vomiting?: No Indigestion/heartburn?: No Diarrhea?: No Constipation?: No  Constitutional Fever: No Night sweats?: No Weight loss?: No Fatigue?: No  Skin Skin rash/lesions?: No Itching?: No  Eyes Blurred vision?: No Double vision?: No  Ears/Nose/Throat Sore throat?: No Sinus problems?: No  Hematologic/Lymphatic Swollen glands?: No Easy bruising?: No  Cardiovascular Leg swelling?: No Chest pain?: No  Respiratory Cough?: No Shortness of breath?: No  Endocrine Excessive thirst?: No  Musculoskeletal Back pain?: No Joint pain?: No  Neurological Headaches?: No Dizziness?: No  Psychologic Depression?: No Anxiety?: No  Physical Exam: BP 135/71 (BP Location: Left Arm, Patient Position: Sitting, Cuff Size: Normal)   Pulse (!) 59   Ht 5\' 6"  (1.676 m)   Wt 184 lb 12.8 oz (83.8 kg)   BMI 29.83 kg/m   Constitutional: Well nourished. Alert and oriented, No acute distress. HEENT: [place] AT, moist mucus membranes. Trachea midline, no  masses. Cardiovascular: No clubbing, cyanosis, or edema. Respiratory: Normal respiratory effort, no increased work of breathing. GI: Abdomen is soft, non tender, non distended, no abdominal masses. Liver and spleen not palpable.  No hernias appreciated.  Stool sample for occult testing is not indicated.   GU: No CVA tenderness.  No bladder fullness or masses.  Patient with uncircumcised phallus. Foreskin easily retracted Urethral meatus is patent.  No penile discharge. No penile lesions.   Skin: No rashes, bruises or suspicious lesions. Lymph: No cervical or inguinal adenopathy. Neurologic: Grossly intact, no focal deficits, moving all 4 extremities. Psychiatric: Normal mood and affect.  Procedure Patient's left corpus cavernosum is identified.  An area near the base of the penis is cleansed with rubbing alcohol.  Careful to avoid the dorsal vein, 2 mcg of EDEX 20 mcg, Lot # [id number] exp # 07/2019 is injected at a 90 degree angle into the left corpus cavernosum near the base of the penis.  Patient experienced a no erection in 15 minutes.  I then injected 2 mcg of EDEX 20 mcg, Lot # [id number] exp # 07/2019 into the right corpus cavernosum.  Patient experienced a soft erection in 15 minutes.     Assessment & Plan:    1. Erectile dysfunction We discussed other treatment modalities such as a vacuum erectile device and the placement of penile prosthesis or increasing the Trimix ingredients -he would like the DVD on penile prosthesis placement and he would like to try a high dose of Trimix injection  [name], [facility] [address] [place], [place] [address] [phone]

## 2017-12-29 NOTE — Telephone Encounter (Signed)
Would you please call in Trimix (30 mg PAPA, 1 mL phen, 50 mg prostaglandin) for the patient to [facility]?

## 2017-12-29 NOTE — Telephone Encounter (Signed)
Script faxed.

## 2018-02-04 ENCOUNTER — Other Ambulatory Visit: Payer: Self-pay

## 2018-02-04 ENCOUNTER — Emergency Department: Payer: Medicare Other

## 2018-02-04 ENCOUNTER — Encounter: Payer: Self-pay | Admitting: Emergency Medicine

## 2018-02-04 ENCOUNTER — Emergency Department
Admission: EM | Admit: 2018-02-04 | Discharge: 2018-02-04 | Disposition: A | Discharge status: Home / Self Care | Payer: Medicare Other | Attending: Student in an Organized Health Care Education/Training Program | Admitting: Student in an Organized Health Care Education/Training Program

## 2018-02-04 DIAGNOSIS — S61210A Laceration without foreign body of right index finger without damage to nail, initial encounter: Secondary | ICD-10-CM | POA: Insufficient documentation

## 2018-02-04 DIAGNOSIS — Z7982 Long term (current) use of aspirin: Secondary | ICD-10-CM | POA: Insufficient documentation

## 2018-02-04 DIAGNOSIS — Y999 Unspecified external cause status: Secondary | ICD-10-CM | POA: Diagnosis not present

## 2018-02-04 DIAGNOSIS — Z87891 Personal history of nicotine dependence: Secondary | ICD-10-CM | POA: Diagnosis not present

## 2018-02-04 DIAGNOSIS — Y93H3 Activity, building and construction: Secondary | ICD-10-CM | POA: Diagnosis not present

## 2018-02-04 DIAGNOSIS — E119 Type 2 diabetes mellitus without complications: Secondary | ICD-10-CM | POA: Diagnosis not present

## 2018-02-04 DIAGNOSIS — I10 Essential (primary) hypertension: Secondary | ICD-10-CM | POA: Diagnosis not present

## 2018-02-04 DIAGNOSIS — Z79899 Other long term (current) drug therapy: Secondary | ICD-10-CM | POA: Insufficient documentation

## 2018-02-04 DIAGNOSIS — S61212A Laceration without foreign body of right middle finger without damage to nail, initial encounter: Secondary | ICD-10-CM | POA: Insufficient documentation

## 2018-02-04 DIAGNOSIS — W312XXA Contact with powered woodworking and forming machines, initial encounter: Secondary | ICD-10-CM | POA: Diagnosis not present

## 2018-02-04 DIAGNOSIS — S6991XA Unspecified injury of right wrist, hand and finger(s), initial encounter: Secondary | ICD-10-CM | POA: Diagnosis present

## 2018-02-04 DIAGNOSIS — Y929 Unspecified place or not applicable: Secondary | ICD-10-CM | POA: Diagnosis not present

## 2018-02-04 DIAGNOSIS — Z7984 Long term (current) use of oral hypoglycemic drugs: Secondary | ICD-10-CM | POA: Insufficient documentation

## 2018-02-04 MED ORDER — BACITRACIN-NEOMYCIN-POLYMYXIN 400-5-5000 EX OINT
TOPICAL_OINTMENT | Freq: Once | CUTANEOUS | Status: AC
  Administered 2018-02-04: 19:00:00 via TOPICAL
  Filled 2018-02-04: qty 1

## 2018-02-04 MED ORDER — LIDOCAINE HCL (PF) 1 % IJ SOLN
5.0000 mL | Freq: Once | INTRAMUSCULAR | Status: AC
  Administered 2018-02-04: 5 mL
  Filled 2018-02-04: qty 5

## 2018-02-04 NOTE — ED Triage Notes (Addendum)
Pt arrived via POV with reports of cutting the 1st and 2nd finger on the right hand with a side grinder.  Injury happened about 40 mins PTA.  Pt unsure when last tetanus shot was.  Dressing applied PTA, bleeding controlled.

## 2018-02-04 NOTE — ED Notes (Signed)
Pt finger were splinted and buddy wrapped. Pt understands d/c orders.

## 2018-02-04 NOTE — ED Notes (Signed)
See triage note  Presents with laceration noted to right index and middle fingers  States he was using a grinder with a blade .  States the saw slipped and cut his fingers

## 2018-02-04 NOTE — ED Triage Notes (Signed)
First Nurse Note:  Cut right second and third fingers with chain saw.  Dressing applied PTA.

## 2018-02-04 NOTE — ED Provider Notes (Signed)
[facility] Emergency Department Provider Note ____________________________________________  Time seen: 1458  I have reviewed the triage vital signs and the nursing notes.  HISTORY  Chief Complaint  Laceration   HPI Lawrence Jensen is a 67 y.o. male presents to the ER today with complaint of laceration to second and third fingers of right hand.  He reports this occurred 1 hour prior to arrival.  He was doing some woodworking and cut his fingers on the blade of a side grinder.  He reports he bled heavily after the accident.  He went to urgent care, and they sent him here for further evaluation and treatment.  He denies any pain at this time.  Bleeding is controlled.  Last tetanus was in 2015.  Past Medical History:  Diagnosis Date  . Diabetes mellitus without complication (HCC)   . ED (erectile dysfunction)   . Hyperlipidemia   . Hypertension     Patient Active Problem List   Diagnosis Date Noted  . Coordination abnormal 11/09/2017  . GERD with stricture 11/09/2017  . Erectile dysfunction due to arterial insufficiency 10/28/2017  . History of smoking 11/02/2016  . Screening for prostate cancer 04/03/2015  . Diabetes mellitus without complication (HCC)   . Hyperlipidemia   . Hypertension   . ED (erectile dysfunction)   . Chest pain on exertion 01/05/2014    Past Surgical History:  Procedure Laterality Date  . APPENDECTOMY  1999    Prior to Admission medications   Medication Sig Start Date End Date Taking? Authorizing Provider  amLODipine (NORVASC) 5 MG tablet Take 1 tablet (5 mg total) by mouth daily. 11/12/17   Trey Sailors, PA-C  aspirin EC 81 MG tablet Take 81 mg by mouth daily.    [provider]  lisinopril (PRINIVIL,ZESTRIL) 20 MG tablet Take 1 tablet (20 mg total) by mouth daily. 11/09/17   Gabriel Cirri, NP  metFORMIN (GLUCOPHAGE) 500 MG tablet Take 2 tablets (1,000 mg total) by mouth 2 (two) times daily with a meal. 11/09/17    Gabriel Cirri, NP  omeprazole (PRILOSEC) 20 MG capsule Take 1 capsule (20 mg total) by mouth daily. 11/09/17   Gabriel Cirri, NP  rosuvastatin (CRESTOR) 40 MG tablet Take 1 tablet (40 mg total) by mouth daily. 11/10/17   Gabriel Cirri, NP    Allergies Patient has no known allergies.  Family History  Problem Relation Age of Onset  . Heart disease Mother   . Heart disease Father   . Congestive Heart Failure Father   . Cancer Father        prostate  . Diabetes Father   . Stroke Father   . Hypertension Father   . Cancer Brother   . Stroke Maternal Grandmother   . Heart disease Brother   . COPD Neg Hx     Social History Social History   Tobacco Use  . Smoking status: Former Smoker    Packs/day: 1.00    Years: 35.00    Pack years: 35.00    Types: Cigarettes    Last attempt to quit: 04/03/1995    Years since quitting: 22.8  . Smokeless tobacco: Never Used  Substance Use Topics  . Alcohol use: No  . Drug use: No    Review of Systems  Constitutional: Negative for fever. Musculoskeletal: Positive for finger pain. Skin: Positive for laceration to second and third fingers. Neurological: Negative for focal weakness, tingling or numbness. ____________________________________________  PHYSICAL EXAM:  VITAL SIGNS: ED Triage Vitals  Enc Vitals Group     BP 02/04/18 1642 131/71     Pulse Rate 02/04/18 1642 63     Resp 02/04/18 1642 18     Temp 02/04/18 1642 98.2 F (36.8 C)     Temp Source 02/04/18 1642 Oral     SpO2 02/04/18 1642 97 %     Weight 02/04/18 1642 175 lb (79.4 kg)     Height 02/04/18 1642 5\' 5"  (1.651 m)     Head Circumference --      Peak Flow --      Pain Score 02/04/18 1643 2     Pain Loc --      Pain Edu? --      Excl. in GC? --     Constitutional: Alert and oriented. Well appearing and in no distress. Cardiovascular: Radial pulse 2+ on the right. Cap refill < 3 secs. Musculoskeletal: Normal flexion and extension of 2nd/3rd fingers right  hand. Neurologic:  Sensation intact to fingertips, right hand. Skin:  2 cm vertical laceration noted to right index finger. Oddly shaped laceration to right index finger.  _______________________________________________   RADIOLOGY  Imaging Orders     DG Finger Middle Right IMPRESSION: No acute osseous abnormality ____________________________________________  PROCEDURES  .Marland KitchenLaceration Repair Date/Time: 02/04/2018 6:18 PM Performed by: Lorre Munroe, NP Authorized by: Lorre Munroe, NP   Consent:    Consent obtained:  Verbal   Consent given by:  Patient   Risks discussed:  Infection, pain, poor cosmetic result and poor wound healing   Alternatives discussed:  No treatment Anesthesia (see MAR for exact dosages):    Anesthesia method:  Local infiltration   Local anesthetic:  Lidocaine 1% w/o epi Laceration details:    Location:  Finger   Finger location:  R index finger   Length (cm):  2 Repair type:    Repair type:  Simple Pre-procedure details:    Preparation:  Patient was prepped and draped in usual sterile fashion Exploration:    Hemostasis achieved with:  Direct pressure   Wound exploration: wound explored through full range of motion and entire depth of wound probed and visualized     Contaminated: no   Treatment:    Area cleansed with:  Betadine and saline   Amount of cleaning:  Extensive   Irrigation solution:  Sterile water   Irrigation method:  Pressure wash   Visualized foreign bodies/material removed: no   Skin repair:    Repair method:  Sutures   Suture size:  4-0   Suture technique:  Simple interrupted   Number of sutures:  5 Approximation:    Approximation:  Close Post-procedure details:    Dressing:  Antibiotic ointment and tube gauze   Patient tolerance of procedure:  Tolerated well, no immediate complications .Marland KitchenLaceration Repair Date/Time: 02/04/2018 6:20 PM Performed by: Lorre Munroe, NP Authorized by: Lorre Munroe, NP   Consent:     Consent obtained:  Verbal   Consent given by:  Patient   Risks discussed:  Infection, pain, poor cosmetic result and poor wound healing   Alternatives discussed:  No treatment Anesthesia (see MAR for exact dosages):    Anesthesia method:  None Laceration details:    Location:  Finger   Finger location:  R long finger   Length (cm):  3   Depth (mm):  3 Repair type:    Repair type:  Complex Pre-procedure details:    Preparation:  Patient was prepped and draped in  usual sterile fashion Exploration:    Limited defect created (wound extended): no     Hemostasis achieved with:  Direct pressure   Wound exploration: wound explored through full range of motion and entire depth of wound probed and visualized     Contaminated: no   Treatment:    Area cleansed with:  Betadine and saline   Amount of cleaning:  Extensive   Irrigation solution:  Sterile water   Irrigation method:  Pressure wash   Visualized foreign bodies/material removed: no     Debridement:  Minimal   Undermining:  None   Scar revision: no   Skin repair:    Repair method:  Sutures   Suture size:  4-0   Suture technique:  Simple interrupted   Number of sutures:  15 Approximation:    Approximation:  Close Post-procedure details:    Dressing:  Antibiotic ointment, sterile dressing and splint for protection   Patient tolerance of procedure:  Tolerated well, no immediate complications   ____________________________________________  INITIAL IMPRESSION / ASSESSMENT AND PLAN / ED COURSE  Laceration 2nd/3rd Fingers, Right Hand:  Lacerations repaired, see procedure note No indication for prophylactic antibiotics at this time Tetanus UTD Laceration care provided Advised him to follow up with PCP in 7-10 days for suture removal  ____________________________________________  FINAL CLINICAL IMPRESSION(S) / ED DIAGNOSES  Final diagnoses:  Laceration of right index finger without foreign body without damage to nail,  initial encounter  Laceration of right middle finger without foreign body without damage to nail, initial encounter      Lorre Munroe, NP 02/04/18 Para Skeans, MD 02/04/18 1851

## 2018-02-04 NOTE — Discharge Instructions (Addendum)
The xray of your middle finger did not show any acute bony abnormality. We placed your middle finger in a splint for protection of the wound, you can take it off to shower. You need to  have sutures removed in 7-10 days by PCP. Monitor for increased pain, swelling, redness or drainage from the area.

## 2018-02-08 ENCOUNTER — Ambulatory Visit (INDEPENDENT_AMBULATORY_CARE_PROVIDER_SITE_OTHER): Payer: Medicare Other | Admitting: Nurse Practitioner

## 2018-02-08 ENCOUNTER — Encounter: Payer: Self-pay | Admitting: Nurse Practitioner

## 2018-02-08 VITALS — BP 132/74 | HR 48 | Ht 66.14 in | Wt 178.1 lb

## 2018-02-08 DIAGNOSIS — E119 Type 2 diabetes mellitus without complications: Secondary | ICD-10-CM

## 2018-02-08 DIAGNOSIS — E1140 Type 2 diabetes mellitus with diabetic neuropathy, unspecified: Secondary | ICD-10-CM | POA: Diagnosis not present

## 2018-02-08 DIAGNOSIS — E782 Mixed hyperlipidemia: Secondary | ICD-10-CM

## 2018-02-08 DIAGNOSIS — I10 Essential (primary) hypertension: Secondary | ICD-10-CM | POA: Diagnosis not present

## 2018-02-08 LAB — BAYER DCA HB A1C WAIVED: HB A1C: 6.2 % (ref <7.0)

## 2018-02-08 MED ORDER — LISINOPRIL 10 MG PO TABS: 10.0000 mg | ORAL_TABLET | Freq: Every day | ORAL | 3 refills | Status: DC

## 2018-02-08 MED ORDER — GABAPENTIN 100 MG PO CAPS: 100.0000 mg | ORAL_CAPSULE | Freq: Three times a day (TID) | ORAL | 3 refills | Status: DC

## 2018-02-08 NOTE — Progress Notes (Signed)
BP 132/74 (BP Location: Left Arm, Patient Position: Sitting, Cuff Size: Normal)   Pulse (!) 48   Ht 5' 6.14" (1.68 m)   Wt 178 lb 2 oz (80.8 kg)   SpO2 98%   BMI 28.63 kg/m    Subjective:    Patient ID: Lawrence Jensen, male    DOB: [birth year], 67 y.o.   MRN: [id number]  HPI: Lawrence Jensen is a 67 y.o. male presents for follow-up T2DM, HTN, HLD  Chief Complaint  Patient presents with  . Diabetes  . Hyperlipidemia  . Hypertension   HYPERTENSION / HYPERLIPIDEMIA Lisinopril decreased to 20MG  QDAY at last visit, but patient reports he has been taking 1/2 of that pill (10MG ) with BP remaining below goal. Satisfied with current treatment? yes Duration of hypertension: chronic BP monitoring frequency: daily BP range: 117-138/70-80 range at home BP medication side effects: no Past BP meds: none Duration of hyperlipidemia: chronic Cholesterol medication side effects: no Cholesterol supplements: fish oil Past cholesterol medications: none Medication compliance: good compliance Aspirin: yes Recent stressors: no Recurrent headaches: no Visual changes: no Palpitations: no Dyspnea: no Chest pain: no Lower extremity edema: no Dizzy/lightheaded: no   DIABETES Continues on Metformin, with dose increase at last visit. Hypoglycemic episodes:no Polydipsia/polyuria: no Visual disturbance: no Chest pain: no Paresthesias: no Glucose Monitoring: yes  Accucheck frequency: Daily  Fasting glucose: <150 in the mornings  Post prandial: N/A  Evening:N/A  Before meals:N/A Taking Insulin?: no  Long acting insulin:  Short acting insulin: Blood Pressure Monitoring: daily Retinal Examination: Not up to Date Foot Exam: Up to Date Diabetic Education: Completed Pneumovax: Up to Date Influenza: refuses Aspirin: yes   NEUROPATHY: Was having issues with gait, unsteady.  Saw Dr. Malvin Johns with [facility] neurology 12/16/17, diagnosed with "some neuropathy" per patient on on chart review  sensory ataxia.  He reports he has been taking his wife's Gabapentin since appointment with neurology, he reports she takes 100 MG pills and he is taking 3-4 of them a day with good relief from burning in feet and knees.  Requests prescription for these.  Discussed risks/benefits of this medication with him.  Current CrCl 73.44 based on July 2019 labs and current weight.  Relevant past medical, surgical, family and social history reviewed and updated as indicated. Interim medical history since our last visit reviewed. Allergies and medications reviewed and updated.  Review of Systems  Constitutional: Negative for activity change, chills, diaphoresis, fatigue and fever.  HENT: Negative for congestion, ear pain, rhinorrhea, sinus pressure, sinus pain and sore throat.   Eyes: Negative for pain, discharge and redness.  Respiratory: Negative for cough, chest tightness, shortness of breath and wheezing.   Cardiovascular: Negative for chest pain, palpitations and leg swelling.  Gastrointestinal: Negative for abdominal distention, abdominal pain, constipation and diarrhea.  Endocrine: Negative for cold intolerance, heat intolerance, polydipsia, polyphagia and polyuria.  Genitourinary: Negative for difficulty urinating.  Musculoskeletal: Negative for back pain, myalgias and neck pain.  Skin: Negative.   Allergic/Immunologic: Negative.   Neurological: Positive for numbness. Negative for dizziness, tremors, syncope, weakness, light-headedness and headaches.       Lower extremity numbness on occasion  Hematological: Negative.   Psychiatric/Behavioral: Negative for behavioral problems. The patient is not nervous/anxious.     Per HPI unless specifically indicated above     Objective:    BP 132/74 (BP Location: Left Arm, Patient Position: Sitting, Cuff Size: Normal)   Pulse (!) 48   Ht 5' 6.14" (1.68 m)  Wt 178 lb 2 oz (80.8 kg)   SpO2 98%   BMI 28.63 kg/m   Wt Readings from Last 3 Encounters:   02/08/18 178 lb 2 oz (80.8 kg)  02/04/18 175 lb (79.4 kg)  12/29/17 184 lb 12.8 oz (83.8 kg)    Physical Exam  Constitutional: He is oriented to person, place, and time. He appears well-developed and well-nourished.  HENT:  Head: Normocephalic and atraumatic.  Eyes: Pupils are equal, round, and reactive to light. Conjunctivae and EOM are normal. Right eye exhibits no discharge. Left eye exhibits no discharge.  Neck: Normal range of motion. Neck supple. No JVD present. Carotid bruit is not present. No thyromegaly present.  Cardiovascular: Normal rate, regular rhythm and normal heart sounds.  Pulmonary/Chest: Effort normal and breath sounds normal.  Abdominal: Soft. Bowel sounds are normal. There is no splenomegaly or hepatomegaly.  Musculoskeletal: Normal range of motion.  Lymphadenopathy:    He has no cervical adenopathy.  Neurological: He is alert and oriented to person, place, and time.  Skin: Skin is warm and dry. No rash noted.  Psychiatric: He has a normal mood and affect. His behavior is normal.    Results for orders placed or performed in visit on 11/09/17  Bayer DCA Hb A1c Waived  Result Value Ref Range   HB A1C (BAYER DCA - WAIVED) 7.2 (H) <7.0 %  CBC with Differential/Platelet  Result Value Ref Range   WBC 5.2 3.4 - 10.8 x10E3/uL   RBC 4.36 4.14 - 5.80 x10E6/uL   Hemoglobin 13.2 13.0 - 17.7 g/dL   Hematocrit 21.3 08.6 - 51.0 %   MCV 89 79 - 97 fL   MCH 30.3 26.6 - 33.0 pg   MCHC 33.8 31.5 - 35.7 g/dL   RDW 57.8 46.9 - 62.9 %   Platelets 207 150 - 450 x10E3/uL   Neutrophils 67 Not Estab. %   Lymphs 21 Not Estab. %   Monocytes 8 Not Estab. %   Eos 3 Not Estab. %   Basos 1 Not Estab. %   Neutrophils Absolute 3.5 1.4 - 7.0 x10E3/uL   Lymphocytes Absolute 1.1 0.7 - 3.1 x10E3/uL   Monocytes Absolute 0.4 0.1 - 0.9 x10E3/uL   EOS (ABSOLUTE) 0.2 0.0 - 0.4 x10E3/uL   Basophils Absolute 0.0 0.0 - 0.2 x10E3/uL   Immature Granulocytes 0 Not Estab. %   Immature Grans  (Abs) 0.0 0.0 - 0.1 x10E3/uL  Comprehensive metabolic panel  Result Value Ref Range   Glucose 154 (H) 65 - 99 mg/dL   BUN 35 (H) 8 - 27 mg/dL   Creatinine, Ser 5.28 0.76 - 1.27 mg/dL   GFR calc non Af Amer 67 >59 mL/min/1.73   GFR calc Af Amer 78 >59 mL/min/1.73   BUN/Creatinine Ratio 31 (H) 10 - 24   Sodium 139 134 - 144 mmol/L   Potassium 4.8 3.5 - 5.2 mmol/L   Chloride 103 96 - 106 mmol/L   CO2 21 20 - 29 mmol/L   Calcium 9.8 8.6 - 10.2 mg/dL   Total Protein 6.9 6.0 - 8.5 g/dL   Albumin 4.7 3.6 - 4.8 g/dL   Globulin, Total 2.2 1.5 - 4.5 g/dL   Albumin/Globulin Ratio 2.1 1.2 - 2.2   Bilirubin Total 0.8 0.0 - 1.2 mg/dL   Alkaline Phosphatase 77 39 - 117 IU/L   AST 17 0 - 40 IU/L   ALT 20 0 - 44 IU/L  Lipid Panel w/o Chol/HDL Ratio  Result Value Ref Range  Cholesterol, Total 196 100 - 199 mg/dL   Triglycerides 161 (H) 0 - 149 mg/dL   HDL 41 >09 mg/dL   VLDL Cholesterol Cal 30 5 - 40 mg/dL   LDL Calculated 604 (H) 0 - 99 mg/dL  TSH  Result Value Ref Range   TSH 2.330 0.450 - 4.500 uIU/mL  PSA  Result Value Ref Range   Prostate Specific Ag, Serum 0.4 0.0 - 4.0 ng/mL      Assessment & Plan:   Problem List Items Addressed This Visit      Cardiovascular and Mediastinum   Hypertension    Chronic, ongoing.  Has been taking 1/2 a 20MG  pill at home with BP readings below 140/90 goal.  Will change to Lisinopril 10MG  QDAY and monitor.  BMP today.      Relevant Medications   lisinopril (PRINIVIL,ZESTRIL) 10 MG tablet     Endocrine   Diabetes mellitus without complication (HCC)    Chronic, ongoing.  A1C today 6.2%, previous was 7.2% and Metformin was increased.  Continue current regimen.      Relevant Medications   lisinopril (PRINIVIL,ZESTRIL) 10 MG tablet   Other Relevant Orders   B12   Bayer DCA Hb A1c Waived (STAT)   Basic Metabolic Panel (BMET)   Type 2 diabetes mellitus with diabetic neuropathy, without long-term current use of insulin (HCC) - Primary    Script  for Gabapentin 100MG  PO TID.  CrCl at this time 73.44.  Continue to monitor and collaborate with neurology.      Relevant Medications   lisinopril (PRINIVIL,ZESTRIL) 10 MG tablet     Other   Hyperlipidemia    Chronic, stable.  Lipid panel today and adjust statin dose as needed.      Relevant Medications   lisinopril (PRINIVIL,ZESTRIL) 10 MG tablet       Follow up plan: Return in about 6 months (around 08/10/2018) for T2DM and HTN.

## 2018-02-08 NOTE — Assessment & Plan Note (Addendum)
Chronic, ongoing.  A1C today 6.2%, previous was 7.2% and Metformin was increased.  Continue current regimen.

## 2018-02-08 NOTE — Patient Instructions (Addendum)
Diabetic Neuropathy Diabetic neuropathy is a nerve disease or nerve damage that is caused by diabetes mellitus. About half of all people with diabetes mellitus have some form of nerve damage. Nerve damage is more common in those who have had diabetes mellitus for many years and who generally have not had good control of their blood sugar (glucose) level. Diabetic neuropathy is a common complication of diabetes mellitus. There are three common types of diabetic neuropathy and a fourth type that is less common and less understood:  Peripheral neuropathy-This is the most common type of diabetic neuropathy. It causes damage to the nerves of the feet and legs first and then eventually the hands and arms. The damage affects the ability to sense touch.  Autonomic neuropathy-This type causes damage to the autonomic nervous system, which controls the following functions: ? Heartbeat. ? Body temperature. ? Blood pressure. ? Urination. ? Digestion. ? Sweating. ? Sexual function.  Focal neuropathy-Focal neuropathy can be painful and unpredictable and occurs most often in older adults with diabetes mellitus. It involves a specific nerve or one area and often comes on suddenly. It usually does not cause long-term problems.  Radiculoplexus neuropathy- Sometimes called lumbosacral radiculoplexus neuropathy, radiculoplexus neuropathy affects the nerves of the thighs, hips, buttocks, or legs. It is more common in people with type 2 diabetes mellitus and in older men. It is characterized by debilitating pain, weakness, and atrophy, usually in the thigh muscles.  What are the causes? The cause of peripheral, autonomic, and focal neuropathies is diabetes mellitus that is uncontrolled and high glucose levels. The cause of radiculoplexus neuropathy is unknown. However, it is thought to be caused by inflammation related to uncontrolled glucose levels. What are the signs or symptoms? Peripheral Neuropathy Peripheral  neuropathy develops slowly over time. When the nerves of the feet and legs no longer work there may be:  Burning, stabbing, or aching pain in the legs or feet.  Inability to feel pressure or pain in your feet. This can lead to: ? Thick calluses over pressure areas. ? Pressure sores. ? Ulcers.  Foot deformities.  Reduced ability to feel temperature changes.  Muscle weakness.  Autonomic Neuropathy The symptoms of autonomic neuropathy vary depending on which nerves are affected. Symptoms may include:  Problems with digestion, such as: ? Feeling sick to your stomach (nausea). ? Vomiting. ? Bloating. ? Constipation. ? Diarrhea. ? Abdominal pain.  Difficulty with urination. This occurs if you lose your ability to sense when your bladder is full. Problems include: ? Urine leakage (incontinence). ? Inability to empty your bladder completely (retention).  Rapid or irregular heartbeat (palpitations).  Blood pressure drops when you stand up (orthostatic hypotension). When you stand up you may feel: ? Dizzy. ? Weak. ? Faint.  In men, inability to attain and maintain an erection.  In women, vaginal dryness and problems with decreased sexual desire and arousal.  Problems with body temperature regulation.  Increased or decreased sweating.  Focal Neuropathy  Abnormal eye movements or abnormal alignment of both eyes.  Weakness in the wrist.  Foot drop. This results in an inability to lift the foot properly and abnormal walking or foot movement.  Paralysis on one side of your face (Bell palsy).  Chest or abdominal pain. Radiculoplexus Neuropathy  Sudden, severe pain in your hip, thigh, or buttocks.  Weakness and wasting of thigh muscles.  Difficulty rising from a seated position.  Abdominal swelling.  Unexplained weight loss (usually more than 10 lb [4.5 kg]). How is   this diagnosed? Peripheral Neuropathy Your senses may be tested. Sensory function testing can be  done with:  A light touch using a monofilament.  A vibration with tuning fork.  A sharp sensation with a pin prick.  Other tests that can help diagnose neuropathy are:  Nerve conduction velocity. This test checks the transmission of an electrical current through a nerve.  Electromyography. This shows how muscles respond to electrical signals transmitted by nearby nerves.  Quantitative sensory testing. This is used to assess how your nerves respond to vibrations and changes in temperature.  Autonomic Neuropathy Diagnosis is often based on reported symptoms. Tell your health care provider if you experience:  Dizziness.  Constipation.  Diarrhea.  Inappropriate urination or inability to urinate.  Inability to get or maintain an erection.  Tests that may be done include:  Electrocardiography or Holter monitor. These are tests that can help show problems with the heart rate or heart rhythm.  An X-ray exam may be done.  Focal Neuropathy Diagnosis is made based on your symptoms and what your health care provider finds during your exam. Other tests may be done. They may include:  Nerve conduction velocities. This checks the transmission of electrical current through a nerve.  Electromyography. This shows how muscles respond to electrical signals transmitted by nearby nerves.  Quantitative sensory testing. This test is used to assess how your nerves respond to vibration and changes in temperature.  Radiculoplexus Neuropathy  Often the first thing is to eliminate any other issue or problems that might be the cause, as there is no standard test for diagnosis.  X-ray exam of your spine and lumbar region.  Spinal tap to rule out cancer.  MRI to rule out other lesions. How is this treated? Once nerve damage occurs, it cannot be reversed. The goal of treatment is to keep the disease or nerve damage from getting worse and affecting more nerve fibers. Controlling your blood  glucose level is the key. Most people with radiculoplexus neuropathy see at least a partial improvement over time. You will need to keep your blood glucose and HbA1c levels in the target range determined by your health care provider. Things that help control blood glucose levels include:  Blood glucose monitoring.  Meal planning.  Physical activity.  Diabetes medicine.  Over time, maintaining lower blood glucose levels helps lessen symptoms. Sometimes, prescription pain medicine is needed. Follow these instructions at home:  Do not smoke.  Keep your blood glucose level in the range that you and your health care provider have determined acceptable for you.  Keep your blood pressure level in the range that you and your health care provider have determined acceptable for you.  Eat a well-balanced diet.  Be physically active every day. Include strength training and balance exercises.  Protect your feet. ? Check your feet every day for sores, cuts, blisters, or signs of infection. ? Wear padded socks and supportive shoes. Use orthotic inserts, if necessary. ? Regularly check the insides of your shoes for worn spots. Make sure there are no rocks or other items inside your shoes before you put them on. Contact a health care provider if:  You have burning, stabbing, or aching pain in the legs or feet.  You are unable to feel pressure or pain in your feet.  You develop problems with digestion such as: ? Nausea. ? Vomiting. ? Bloating. ? Constipation. ? Diarrhea. ? Abdominal pain.  You have difficulty with urination, such as: ? Incontinence. ? Retention.    You have palpitations.  You develop orthostatic hypotension. When you stand up you may feel: ? Dizzy. ? Weak. ? Faint.  You cannot attain and maintain an erection (in men).  You have vaginal dryness and problems with decreased sexual desire and arousal (in women).  You have severe pain in your thighs, legs, or  buttocks.  You have unexplained weight loss. This information is not intended to replace advice given to you by your health care provider. Make sure you discuss any questions you have with your health care provider. Document Released: 06/08/2001 Document Revised: 09/05/2015 Document Reviewed: 09/08/2012 Elsevier Interactive Patient Education  2017 [employer] Gabapentin capsules or tablets What is this medicine? GABAPENTIN (GA ba pen tin) is used to control partial seizures in adults with epilepsy. It is also used to treat certain types of nerve pain. This medicine may be used for other purposes; ask your health care provider or pharmacist if you have questions. COMMON BRAND NAME(S): Active-PAC with Gabapentin, Gabarone, Neurontin What should I tell my health care provider before I take this medicine? They need to know if you have any of these conditions: -kidney disease -suicidal thoughts, plans, or attempt; a previous suicide attempt by you or a family member -an unusual or allergic reaction to gabapentin, other medicines, foods, dyes, or preservatives -pregnant or trying to get pregnant -breast-feeding How should I use this medicine? Take this medicine by mouth with a glass of water. Follow the directions on the prescription label. You can take it with or without food. If it upsets your stomach, take it with food.Take your medicine at regular intervals. Do not take it more often than directed. Do not stop taking except on your doctor's advice. If you are directed to break the 600 or 800 mg tablets in half as part of your dose, the extra half tablet should be used for the next dose. If you have not used the extra half tablet within 28 days, it should be thrown away. A special MedGuide will be given to you by the pharmacist with each prescription and refill. Be sure to read this information carefully each time. Talk to your pediatrician regarding the use of this medicine in children.  Special care may be needed. Overdosage: If you think you have taken too much of this medicine contact a poison control center or emergency room at once. NOTE: This medicine is only for you. Do not share this medicine with others. What if I miss a dose? If you miss a dose, take it as soon as you can. If it is almost time for your next dose, take only that dose. Do not take double or extra doses. What may interact with this medicine? Do not take this medicine with any of the following medications: -other gabapentin products This medicine may also interact with the following medications: -alcohol -antacids -antihistamines for allergy, cough and cold -certain medicines for anxiety or sleep -certain medicines for depression or psychotic disturbances -homatropine; hydrocodone -naproxen -narcotic medicines (opiates) for pain -phenothiazines like chlorpromazine, mesoridazine, prochlorperazine, thioridazine This list may not describe all possible interactions. Give your health care provider a list of all the medicines, herbs, non-prescription drugs, or dietary supplements you use. Also tell them if you smoke, drink alcohol, or use illegal drugs. Some items may interact with your medicine. What should I watch for while using this medicine? Visit your doctor or health care professional for regular checks on your progress. You may want to keep a record at home of  how you feel your condition is responding to treatment. You may want to share this information with your doctor or health care professional at each visit. You should contact your doctor or health care professional if your seizures get worse or if you have any new types of seizures. Do not stop taking this medicine or any of your seizure medicines unless instructed by your doctor or health care professional. Stopping your medicine suddenly can increase your seizures or their severity. Wear a medical identification bracelet or chain if you are taking  this medicine for seizures, and carry a card that lists all your medications. You may get drowsy, dizzy, or have blurred vision. Do not drive, use machinery, or do anything that needs mental alertness until you know how this medicine affects you. To reduce dizzy or fainting spells, do not sit or stand up quickly, especially if you are an older patient. Alcohol can increase drowsiness and dizziness. Avoid alcoholic drinks. Your mouth may get dry. Chewing sugarless gum or sucking hard candy, and drinking plenty of water will help. The use of this medicine may increase the chance of suicidal thoughts or actions. Pay special attention to how you are responding while on this medicine. Any worsening of mood, or thoughts of suicide or dying should be reported to your health care professional right away. Women who become pregnant while using this medicine may enroll in the [place] Antiepileptic Drug Pregnancy Registry by calling [phone]. This registry collects information about the safety of antiepileptic drug use during pregnancy. What side effects may I notice from receiving this medicine? Side effects that you should report to your doctor or health care professional as soon as possible: -allergic reactions like skin rash, itching or hives, swelling of the face, lips, or tongue -worsening of mood, thoughts or actions of suicide or dying Side effects that usually do not require medical attention (report to your doctor or health care professional if they continue or are bothersome): -constipation -difficulty walking or controlling muscle movements -dizziness -nausea -slurred speech -tiredness -tremors -weight gain This list may not describe all possible side effects. Call your doctor for medical advice about side effects. You may report side effects to FDA at 1-800-FDA-1088. Where should I keep my medicine? Keep out of reach of children. This medicine may cause accidental overdose and  death if it taken by other adults, children, or pets. Mix any unused medicine with a substance like cat litter or coffee grounds. Then throw the medicine away in a sealed container like a sealed bag or a coffee can with a lid. Do not use the medicine after the expiration date. Store at room temperature between 15 and 30 degrees C (59 and 86 degrees F). NOTE: This sheet is a summary. It may not cover all possible information. If you have questions about this medicine, talk to your doctor, pharmacist, or health care provider.  2018 Elsevier/Gold Standard (2013-05-26 15:26:50)

## 2018-02-08 NOTE — Assessment & Plan Note (Signed)
Chronic, ongoing.  Has been taking 1/2 a 20MG  pill at home with BP readings below 140/90 goal.  Will change to Lisinopril 10MG  QDAY and monitor.  BMP today.

## 2018-02-08 NOTE — Assessment & Plan Note (Signed)
Chronic, stable.  Lipid panel today and adjust statin dose as needed.

## 2018-02-08 NOTE — Assessment & Plan Note (Signed)
Script for Gabapentin 100MG  PO TID.  CrCl at this time 73.44.  Continue to monitor and collaborate with neurology.

## 2018-02-09 LAB — BASIC METABOLIC PANEL
BUN/Creatinine Ratio: 17 (ref 10–24)
BUN: 18 mg/dL (ref 8–27)
CALCIUM: 10.1 mg/dL (ref 8.6–10.2)
CO2: 26 mmol/L (ref 20–29)
CREATININE: 1.06 mg/dL (ref 0.76–1.27)
Chloride: 98 mmol/L (ref 96–106)
GFR calc Af Amer: 84 mL/min/{1.73_m2} (ref >59)
GFR, EST NON AFRICAN AMERICAN: 73 mL/min/{1.73_m2} (ref >59)
GLUCOSE: 142 mg/dL — AB (ref 65–99)
POTASSIUM: 4.4 mmol/L (ref 3.5–5.2)
SODIUM: 138 mmol/L (ref 134–144)

## 2018-02-09 LAB — VITAMIN B12: Vitamin B-12: 609 pg/mL (ref 232–1245)

## 2018-02-11 ENCOUNTER — Ambulatory Visit (INDEPENDENT_AMBULATORY_CARE_PROVIDER_SITE_OTHER): Payer: Medicare Other | Admitting: Unknown Physician Specialty

## 2018-02-11 VITALS — BP 115/60 | HR 55 | Temp 98.5°F | Wt 181.0 lb

## 2018-02-11 DIAGNOSIS — L089 Local infection of the skin and subcutaneous tissue, unspecified: Secondary | ICD-10-CM

## 2018-02-11 DIAGNOSIS — B9689 Other specified bacterial agents as the cause of diseases classified elsewhere: Secondary | ICD-10-CM | POA: Diagnosis not present

## 2018-02-11 DIAGNOSIS — Z4802 Encounter for removal of sutures: Secondary | ICD-10-CM

## 2018-02-11 MED ORDER — SULFAMETHOXAZOLE-TRIMETHOPRIM 800-160 MG PO TABS: 1.0000 | ORAL_TABLET | Freq: Two times a day (BID) | ORAL | 0 refills | Status: DC

## 2018-02-11 MED ORDER — MUPIROCIN 2 % EX OINT: 1.0000 "application " | TOPICAL_OINTMENT | Freq: Two times a day (BID) | CUTANEOUS | 0 refills | Status: DC

## 2018-02-11 NOTE — Progress Notes (Signed)
BP 115/60   Pulse (!) 55   Temp 98.5 F (36.9 C) (Oral)   Wt 181 lb (82.1 kg)   SpO2 98%   BMI 29.09 kg/m    Subjective:    Patient ID: Lawrence Jensen, male    DOB: 06-21-1950, 67 y.o.   MRN: [id number]  HPI: Lawrence Jensen is a 67 y.o. male  Chief Complaint  Patient presents with  . Suture / Staple Removal    Right hand, middle and pointer. Saw    Suture removal: 5 sutures removed intact from first finger of right hand without difficulty. 15 sutures removed intact from second finger of right hand with difficulty due to inflammation and drainage surrounding sutures. First finger laceration healing with mild edema, redness. No appreciable drainage. Second finger of right hand with significant redness, edema, serosanguinous drainage surrounding laceration from proximal to middle phalangeal joint to proximal to distal phalangeal joint. Patient denies significant pain to area.  Td last given in 2015  Relevant past medical, surgical, family and social history reviewed and updated as indicated. Interim medical history since our last visit reviewed. Allergies and medications reviewed and updated.  Review of Systems  Constitutional: Negative.  Negative for fever.  Respiratory: Negative.     Per HPI unless specifically indicated above     Objective:    BP 115/60   Pulse (!) 55   Temp 98.5 F (36.9 C) (Oral)   Wt 181 lb (82.1 kg)   SpO2 98%   BMI 29.09 kg/m   Wt Readings from Last 3 Encounters:  02/11/18 181 lb (82.1 kg)  02/08/18 178 lb 2 oz (80.8 kg)  02/04/18 175 lb (79.4 kg)    Physical Exam  Constitutional: He is oriented to person, place, and time. He appears well-developed and well-nourished. No distress.  HENT:  Head: Normocephalic and atraumatic.  Eyes: Conjunctivae and lids are normal. Right eye exhibits no discharge. Left eye exhibits no discharge. No scleral icterus.  Pulmonary/Chest: Effort normal.  Abdominal: Normal appearance. There is no  splenomegaly or hepatomegaly.  Musculoskeletal: Normal range of motion.  Neurological: He is alert and oriented to person, place, and time.  Psychiatric: He has a normal mood and affect. His behavior is normal. Judgment and thought content normal.   Suture removal: 5 sutures removed intact from first finger of right hand without difficulty. 15 sutures removed intact from second finger of right hand with difficulty due to inflammation and drainage surrounding sutures. First finger laceration healing with mild edema, redness. No appreciable drainage. Second finger of right hand with significant redness, edema, serosanguinous drainage surrounding laceration from proximal to middle phalangeal joint to proximal to distal phalangeal joint. Patient denies significant pain to area.  Results for orders placed or performed in visit on 02/08/18  B12  Result Value Ref Range   Vitamin B-12 609 232 - 1,245 pg/mL  Bayer DCA Hb A1c Waived (STAT)  Result Value Ref Range   HB A1C (BAYER DCA - WAIVED) 6.2 <7.0 %  Basic Metabolic Panel (BMET)  Result Value Ref Range   Glucose 142 (H) 65 - 99 mg/dL   BUN 18 8 - 27 mg/dL   Creatinine, Ser 4.09 0.76 - 1.27 mg/dL   GFR calc non Af Amer 73 >59 mL/min/1.73   GFR calc Af Amer 84 >59 mL/min/1.73   BUN/Creatinine Ratio 17 10 - 24   Sodium 138 134 - 144 mmol/L   Potassium 4.4 3.5 - 5.2 mmol/L   Chloride  98 96 - 106 mmol/L   CO2 26 20 - 29 mmol/L   Calcium 10.1 8.6 - 10.2 mg/dL      Assessment & Plan:   Problem List Items Addressed This Visit    None    Visit Diagnoses    Visit for suture removal    -  Primary   See above for procedure description.  Instructed to keep area covered.  Remove soiled dressings.  Call if worsening   Localized bacterial skin infection       Secondary to laceration and sutures.  Rx for Bactroban topical to apply twice per day.  Rx for Septra BID for 7 days.  Recheck in 4 days.    Relevant Medications   mupirocin ointment  (BACTROBAN) 2 %   sulfamethoxazole-trimethoprim (BACTRIM DS,SEPTRA DS) 800-160 MG tablet       Follow up plan: Return in about 4 days (around 02/15/2018).

## 2018-02-15 ENCOUNTER — Encounter: Payer: Self-pay | Admitting: Nurse Practitioner

## 2018-02-15 ENCOUNTER — Other Ambulatory Visit: Payer: Self-pay

## 2018-02-15 ENCOUNTER — Ambulatory Visit: Payer: Medicare Other | Admitting: Nurse Practitioner

## 2018-02-15 VITALS — BP 117/70 | HR 58 | Temp 97.5°F | Ht 66.0 in | Wt 181.0 lb

## 2018-02-15 DIAGNOSIS — L089 Local infection of the skin and subcutaneous tissue, unspecified: Secondary | ICD-10-CM | POA: Diagnosis not present

## 2018-02-15 DIAGNOSIS — S60412A Abrasion of right middle finger, initial encounter: Secondary | ICD-10-CM | POA: Diagnosis not present

## 2018-02-15 DIAGNOSIS — I10 Essential (primary) hypertension: Secondary | ICD-10-CM

## 2018-02-15 MED ORDER — AMLODIPINE BESYLATE 5 MG PO TABS: 5.0000 mg | ORAL_TABLET | Freq: Every day | ORAL | 3 refills | Status: DC

## 2018-02-15 MED ORDER — METFORMIN HCL 500 MG PO TABS: 1000.0000 mg | ORAL_TABLET | Freq: Two times a day (BID) | ORAL | 3 refills | Status: DC

## 2018-02-15 MED ORDER — ROSUVASTATIN CALCIUM 40 MG PO TABS: 40.0000 mg | ORAL_TABLET | Freq: Every day | ORAL | 3 refills | Status: DC

## 2018-02-15 MED ORDER — OMEPRAZOLE 20 MG PO CPDR: 20.0000 mg | DELAYED_RELEASE_CAPSULE | Freq: Every day | ORAL | 3 refills | Status: DC

## 2018-02-15 NOTE — Progress Notes (Signed)
BP 117/70   Pulse (!) 58   Temp (!) 97.5 F (36.4 C) (Oral)   Ht 5\' 6"  (1.676 m)   Wt 181 lb (82.1 kg)   SpO2 98%   BMI 29.21 kg/m    Subjective:    Patient ID: Lawrence Jensen, male    DOB: 13-Apr-1951, 67 y.o.   MRN: [id number]  HPI: Lawrence Jensen is a 67 y.o. male presents for wound follow-up  Chief Complaint  Patient presents with  . Wound Check    stiches removal f/u   INFECTION RIGHT MIDDLE FINGER: Presents for follow-up wound to right middle and index fingers.  Initial injury took place on 02/04/18 to which he presented to ER after woodworking accident where he cut fingers with blade of side grinder.  He presented for suture removal on 02/11/18 and Wicker DNP noted erythema, edema, warmth to site of middle finger from proximal to distal phalangeal joint.  He was placed on Bactrim and has been performing wound care at home with Mupirocin and dressing twice a day.  He reports he has been cleaning area first with Peroxide.  Denies pain, fever, immobility of joint, or tenderness.  He reports is is "feeling better and not causing a problem".  Lawrence Jensen reports he is a "slow healer", is T2DM.  Relevant past medical, surgical, family and social history reviewed and updated as indicated. Interim medical history since our last visit reviewed. Allergies and medications reviewed and updated.  Review of Systems  Constitutional: Negative for appetite change, chills, diaphoresis, fatigue and fever.  Respiratory: Negative for cough, chest tightness and shortness of breath.   Cardiovascular: Negative for chest pain, palpitations and leg swelling.  Gastrointestinal: Negative for abdominal distention, abdominal pain, diarrhea, nausea and vomiting.  Musculoskeletal: Negative for joint swelling.  Skin: Positive for wound. Negative for color change.  Neurological: Negative.   Psychiatric/Behavioral: Negative.     Per HPI unless specifically indicated above     Objective:      BP 117/70   Pulse (!) 58   Temp (!) 97.5 F (36.4 C) (Oral)   Ht 5\' 6"  (1.676 m)   Wt 181 lb (82.1 kg)   SpO2 98%   BMI 29.21 kg/m   Wt Readings from Last 3 Encounters:  02/15/18 181 lb (82.1 kg)  02/11/18 181 lb (82.1 kg)  02/08/18 178 lb 2 oz (80.8 kg)    Physical Exam  Constitutional: He is oriented to person, place, and time. He appears well-developed and well-nourished.  HENT:  Head: Normocephalic and atraumatic.  Eyes: Pupils are equal, round, and reactive to light. Conjunctivae and EOM are normal. Right eye exhibits no discharge. Left eye exhibits no discharge.  Neck: Normal range of motion.  Cardiovascular: Normal rate, regular rhythm and normal heart sounds.  Pulmonary/Chest: Effort normal and breath sounds normal.  Abdominal: Soft. Bowel sounds are normal.  Neurological: He is alert and oriented to person, place, and time.  Skin: Skin is warm and dry.  Psychiatric: He has a normal mood and affect. His behavior is normal. Judgment and thought content normal.   WOUND: Right index finger with crusting to wound site, no erythema or drainage.  Right middle finger with decrease in erythema noted from previous exam and decrease in edema, although continues to be mild erythema this has improved from previous exam.  Laceration remains open without crusting from proximal to middle phalangeal joint with serosang drainage, no odor.  No tenderness or warmth to palpation.  Able to move finger without difficulty.    Results for orders placed or performed in visit on 02/08/18  B12  Result Value Ref Range   Vitamin B-12 609 232 - 1,245 pg/mL  Bayer DCA Hb A1c Waived (STAT)  Result Value Ref Range   HB A1C (BAYER DCA - WAIVED) 6.2 <7.0 %  Basic Metabolic Panel (BMET)  Result Value Ref Range   Glucose 142 (H) 65 - 99 mg/dL   BUN 18 8 - 27 mg/dL   Creatinine, Ser 5.78 0.76 - 1.27 mg/dL   GFR calc non Af Amer 73 >59 mL/min/1.73   GFR calc Af Amer 84 >59 mL/min/1.73   BUN/Creatinine  Ratio 17 10 - 24   Sodium 138 134 - 144 mmol/L   Potassium 4.4 3.5 - 5.2 mmol/L   Chloride 98 96 - 106 mmol/L   CO2 26 20 - 29 mmol/L   Calcium 10.1 8.6 - 10.2 mg/dL      Assessment & Plan:   Problem List Items Addressed This Visit      Cardiovascular and Mediastinum   Hypertension   Relevant Medications   amLODipine (NORVASC) 5 MG tablet   rosuvastatin (CRESTOR) 40 MG tablet     Musculoskeletal and Integument   Abrasion of right middle finger with infection - Primary    Continue Bactrim, last day of treatment 02/18/18.  Continue Mupirocin and dressing change BID.  Follow-up with patient on 02/21/18 for wound check, monitor closely.  Patient advised to return to office immediately if increased erythema, edema, pain, odor, or yellow drainage present to site.            Follow up plan: Return in about 6 days (around 02/21/2018) for wound check.

## 2018-02-15 NOTE — Patient Instructions (Signed)

## 2018-02-15 NOTE — Assessment & Plan Note (Signed)
Continue Bactrim, last day of treatment 02/18/18.  Continue Mupirocin and dressing change BID.  Follow-up with patient on 02/21/18 for wound check, monitor closely.  Patient advised to return to office immediately if increased erythema, edema, pain, odor, or yellow drainage present to site.

## 2018-02-21 ENCOUNTER — Ambulatory Visit: Payer: Medicare Other | Admitting: Nurse Practitioner

## 2018-02-21 ENCOUNTER — Encounter: Payer: Self-pay | Admitting: Nurse Practitioner

## 2018-02-21 ENCOUNTER — Other Ambulatory Visit: Payer: Self-pay

## 2018-02-21 DIAGNOSIS — L089 Local infection of the skin and subcutaneous tissue, unspecified: Secondary | ICD-10-CM

## 2018-02-21 DIAGNOSIS — S60412A Abrasion of right middle finger, initial encounter: Secondary | ICD-10-CM | POA: Diagnosis not present

## 2018-02-21 MED ORDER — MUPIROCIN 2 % EX OINT: 1.0000 "application " | TOPICAL_OINTMENT | Freq: Two times a day (BID) | CUTANEOUS | 0 refills | Status: DC

## 2018-02-21 NOTE — Progress Notes (Signed)
BP 111/65   Pulse 64 Comment: apical  Temp 97.6 F (36.4 C) (Oral)   Ht 5\' 6"  (1.676 m)   Wt 178 lb (80.7 kg)   SpO2 97%   BMI 28.73 kg/m    Subjective:    Patient ID: Lawrence Jensen, male    DOB: June 12, 1950, 67 y.o.   MRN: [id number]  HPI: Lawrence Jensen is a 67 y.o. male presents for wound check  Chief Complaint  Patient presents with  . Wound Check   WOUND CHECK: Presents for follow-up one wound to right middle and index fingers, which initially took place on 02/04/18 d/t woodworking accident when he cut fingers with blade of side grinder.  At that time he presented to ER and sutures were placed, which were then removed by NP Wicker on 02/11/18.  At that time he was noted to have erythema, edema, and warmth to areas and treated with Bactrim and Mupirocin. Has finished course of Bactrim at this time and continues to clean wound daily with Mupirocin, although reports tube"is getting empty".  He stated during previous exam that he does not heal quickly, has dx of T2DM.  Denies pain, fever, odor, or drainage to wound.  States "it is getting better".  Relevant past medical, surgical, family and social history reviewed and updated as indicated. Interim medical history since our last visit reviewed. Allergies and medications reviewed and updated.  Review of Systems  Constitutional: Negative for activity change, chills, diaphoresis, fatigue and fever.  Respiratory: Negative for cough, chest tightness, shortness of breath and wheezing.   Cardiovascular: Negative for chest pain, palpitations and leg swelling.  Gastrointestinal: Negative for abdominal distention, abdominal pain, constipation, diarrhea, nausea and vomiting.  Musculoskeletal: Negative for arthralgias and joint swelling.  Skin: Positive for wound. Negative for color change.  Allergic/Immunologic: Negative.   Neurological: Negative for syncope, weakness, numbness and headaches.  Psychiatric/Behavioral: Negative.      Per HPI unless specifically indicated above     Objective:    BP 111/65   Pulse 64 Comment: apical  Temp 97.6 F (36.4 C) (Oral)   Ht 5\' 6"  (1.676 m)   Wt 178 lb (80.7 kg)   SpO2 97%   BMI 28.73 kg/m   Wt Readings from Last 3 Encounters:  02/21/18 178 lb (80.7 kg)  02/15/18 181 lb (82.1 kg)  02/11/18 181 lb (82.1 kg)    Physical Exam  Constitutional: He is oriented to person, place, and time. He appears well-developed and well-nourished.  HENT:  Head: Normocephalic and atraumatic.  Eyes: Pupils are equal, round, and reactive to light. Conjunctivae and EOM are normal. Right eye exhibits no discharge. Left eye exhibits no discharge.  Neck: No JVD present. Carotid bruit is not present.  Cardiovascular: Normal rate, regular rhythm and normal heart sounds.  Pulmonary/Chest: Effort normal and breath sounds normal.  Abdominal: Soft. Bowel sounds are normal.  Neurological: He is alert and oriented to person, place, and time.  Skin: Skin is warm and dry.  Psychiatric: He has a normal mood and affect. His behavior is normal. Judgment and thought content normal.  Nursing note and vitals reviewed.  WOUND: Right index finger with crusting to wound site, minimal erythema and no drainage/warmth.  Right middle finger with minimal erythema noted and no edema.  Laceration from proximal to middle phalangeal joint remains slightly open to distal aspect without crusting and scant serosang drainage, no odor. Overall, improved with increased crusting.  No tenderness or warmth to palpation.  Able to move finger without difficulty, although not at full ROM as of yet, has improved in ability to move finger.   Results for orders placed or performed in visit on 02/08/18  B12  Result Value Ref Range   Vitamin B-12 609 232 - 1,245 pg/mL  Bayer DCA Hb A1c Waived (STAT)  Result Value Ref Range   HB A1C (BAYER DCA - WAIVED) 6.2 <7.0 %  Basic Metabolic Panel (BMET)  Result Value Ref Range   Glucose 142  (H) 65 - 99 mg/dL   BUN 18 8 - 27 mg/dL   Creatinine, Ser 9.60 0.76 - 1.27 mg/dL   GFR calc non Af Amer 73 >59 mL/min/1.73   GFR calc Af Amer 84 >59 mL/min/1.73   BUN/Creatinine Ratio 17 10 - 24   Sodium 138 134 - 144 mmol/L   Potassium 4.4 3.5 - 5.2 mmol/L   Chloride 98 96 - 106 mmol/L   CO2 26 20 - 29 mmol/L   Calcium 10.1 8.6 - 10.2 mg/dL      Assessment & Plan:   Problem List Items Addressed This Visit      Musculoskeletal and Integument   Abrasion of right middle finger with infection    Bactrim course completed 02/18/18.  Continue Mupirocin and dressing change twice daily and monitor closely.  Patient is aware to return to office immediately if increased drainage, edema, redness, pain, odor, or change in color of drainage. Will return in one week for wound check.      Relevant Medications   mupirocin ointment (BACTROBAN) 2 %       Follow up plan: Return in about 1 week (around 02/28/2018) for wound check.

## 2018-02-21 NOTE — Patient Instructions (Signed)
Wound Check  If you have a wound, it may take some time to heal. Eventually, a scar will form. The scar will also fade with time. It is important to take care of your wound while it is healing. This helps to protect your wound from infection.  How should I take care of my wound at home?   Some wounds are allowed to close on their own or are repaired at a later date. There are many different ways to close and cover a wound, including stitches (sutures), skin glue, and adhesive strips. Follow your health care provider's instructions about:  ? Wound care.  ? Bandage (dressing) changes and removal.  ? Wound closure removal.   Take medicines only as directed by your health care provider.   Keep all follow-up visits as directed by your health care provider. This is important.   Do not take baths, swim, or use a hot tub until your health care provider approves. You may shower as directed by your health care provider.   Keep your wound clean and dry.  What affects scar formation?  Scars affect each person differently. How your body scars depends on:   The location and size of your wound.   Traits that you inherited from your parents (genetic predisposition).   How you take care of your wound. Irritation and inflammation increase the amount of scar formation.   Sun exposure. This can darken a scar.    When should I call or see my health care provider?  Call or see your health care provider if:   You have redness, swelling, or pain at your wound site.   You have fluid, blood, or pus coming from your wound.   You have muscle aches, chills, or a general ill feeling.   You notice a bad smell coming from the wound.   Your wound separates after the sutures, staples, or skin adhesive strips have been removed.   You have persistent nausea or vomiting.   You have a fever.   You are dizzy.    When should I call 911 or go to the emergency room?  Call 911 or go to the emergency room if:   You faint.   You have  difficulty breathing.    This information is not intended to replace advice given to you by your health care provider. Make sure you discuss any questions you have with your health care provider.  Document Released: 01/04/2004 Document Revised: 09/11/2015 Document Reviewed: 01/09/2014  Elsevier Interactive Patient Education  2018 [employer] Inc[employer]

## 2018-02-21 NOTE — Assessment & Plan Note (Signed)
Bactrim course completed 02/18/18.  Continue Mupirocin and dressing change twice daily and monitor closely.  Patient is aware to return to office immediately if increased drainage, edema, redness, pain, odor, or change in color of drainage. Will return in one week for wound check.

## 2018-03-01 ENCOUNTER — Encounter: Payer: Self-pay | Admitting: Nurse Practitioner

## 2018-03-01 ENCOUNTER — Ambulatory Visit: Payer: Medicare Other | Admitting: Nurse Practitioner

## 2018-03-01 VITALS — BP 123/66 | HR 58 | Temp 97.4°F | Ht 66.0 in | Wt 179.1 lb

## 2018-03-01 DIAGNOSIS — E782 Mixed hyperlipidemia: Secondary | ICD-10-CM

## 2018-03-01 DIAGNOSIS — L089 Local infection of the skin and subcutaneous tissue, unspecified: Secondary | ICD-10-CM | POA: Diagnosis not present

## 2018-03-01 DIAGNOSIS — S60412A Abrasion of right middle finger, initial encounter: Secondary | ICD-10-CM

## 2018-03-01 LAB — LP+ALT+AST PICCOLO, WAIVED
ALT (SGPT) PICCOLO, WAIVED: 27 U/L (ref 10–47)
AST (SGOT) PICCOLO, WAIVED: 26 U/L (ref 11–38)
Chol/HDL Ratio Piccolo,Waive: 2.3 mg/dL
Cholesterol Piccolo, Waived: 99 mg/dL (ref <200)
HDL Chol Piccolo, Waived: 43 mg/dL — ABNORMAL LOW (ref >59)
LDL Chol Calc Piccolo Waived: 39 mg/dL (ref <100)
TRIGLYCERIDES PICCOLO,WAIVED: 84 mg/dL (ref <150)
VLDL Chol Calc Piccolo,Waive: 17 mg/dL (ref <30)

## 2018-03-01 NOTE — Assessment & Plan Note (Signed)
Chronic, stable.  Lipid panel today improved from July labs.  Continue Crestor.

## 2018-03-01 NOTE — Progress Notes (Signed)
BP 123/66 (BP Location: Left Arm, Patient Position: Sitting, Cuff Size: Normal)   Pulse (!) 58 Comment: apical  Temp (!) 97.4 F (36.3 C)   Ht 5\' 6"  (1.676 m)   Wt 179 lb 2 oz (81.3 kg)   SpO2 96%   BMI 28.91 kg/m    Subjective:    Patient ID: Lawrence Jensen, male    DOB: Jul 04, 1950, 67 y.o.   MRN: [id number]  HPI: Fintan Grater is a 67 y.o. male presents for wound check and HLD  Chief Complaint  Patient presents with  . Wound Check  . Hyperlipidemia   WOUND CHECK: Initial wound 02/04/18 to right middle and index finger after woodworking accident.  Went to ER and sutures were placed, which were then removed in clinic 02/11/18.  At that time erythema and warmth was noted to site and pt was placed on Bactrim and Mupirocin.  Has completed Bactrim course.  Has been using Mupirocin daily and cleaning area.  Reports he heals slowly d/t T2DM. Denies pain or drainage to site.  HYPERLIPIDEMIA Continues on Crestor 40 MG QHS without ADR. Hyperlipidemia status: excellent compliance Satisfied with current treatment?  yes Side effects:  no Medication compliance: excellent compliance Past cholesterol meds: none Supplements: none Aspirin:  yes The 10-year ASCVD risk score Denman George DC Jr., et al., 2013) is: 29.1%   Values used to calculate the score:     Age: 20 years     Sex: Male     Is Non-Hispanic African American: No     Diabetic: Yes     Tobacco smoker: No     Systolic Blood Pressure: 123 mmHg     Is BP treated: Yes     HDL Cholesterol: 41 mg/dL     Total Cholesterol: 196 mg/dL Chest pain:  no Coronary artery disease:  no Family history CAD:  no Family history early CAD:  no  Relevant past medical, surgical, family and social history reviewed and updated as indicated. Interim medical history since our last visit reviewed. Allergies and medications reviewed and updated.  Review of Systems  Constitutional: Negative for activity change, appetite change, chills,  fatigue and fever.  Respiratory: Negative for cough, chest tightness, shortness of breath and wheezing.   Cardiovascular: Negative for chest pain, palpitations and leg swelling.  Gastrointestinal: Negative for abdominal distention, abdominal pain, constipation, diarrhea, nausea and vomiting.  Endocrine: Negative.   Musculoskeletal: Negative.   Skin: Positive for wound.  Neurological: Negative.   Psychiatric/Behavioral: Negative.    Per HPI unless specifically indicated above     Objective:    BP 123/66 (BP Location: Left Arm, Patient Position: Sitting, Cuff Size: Normal)   Pulse (!) 58 Comment: apical  Temp (!) 97.4 F (36.3 C)   Ht 5\' 6"  (1.676 m)   Wt 179 lb 2 oz (81.3 kg)   SpO2 96%   BMI 28.91 kg/m   Wt Readings from Last 3 Encounters:  03/01/18 179 lb 2 oz (81.3 kg)  02/21/18 178 lb (80.7 kg)  02/15/18 181 lb (82.1 kg)    Physical Exam  Constitutional: He is oriented to person, place, and time. He appears well-developed and well-nourished.  HENT:  Head: Normocephalic and atraumatic.  Eyes: Pupils are equal, round, and reactive to light. Conjunctivae and EOM are normal. Right eye exhibits no discharge. Left eye exhibits no discharge.  Neck: Normal range of motion. Neck supple. No JVD present. Carotid bruit is not present. No thyromegaly present.  Cardiovascular: Normal  rate, regular rhythm and normal heart sounds.  Pulmonary/Chest: Effort normal and breath sounds normal. No respiratory distress.  Abdominal: Soft. Bowel sounds are normal.  Musculoskeletal: Normal range of motion.  Lymphadenopathy:    He has no cervical adenopathy.  Neurological: He is alert and oriented to person, place, and time.  Skin: Skin is warm and dry.  Psychiatric: He has a normal mood and affect. His behavior is normal. Judgment and thought content normal.   WOUND: Right index finger with crusting to entire wound site, minimal erythema and no drainage/warmth. Right middle finger with minimal  erythema noted and no edema. Laceration from proximal to middle phalangeal joint withcrusting and no drainage, no odor. Overall, improved with crusting. No tenderness or warmth to palpation. X 1 suture noted remaining to distal aspect of wound and x 1 suture thread remaining.  Both removed at this time without difficulty.  Able to move finger without difficulty, has improved in ability to move finger.Length of middle finger wound 3 cm x 1 cm width.  Results for orders placed or performed in visit on 02/08/18  B12  Result Value Ref Range   Vitamin B-12 609 232 - 1,245 pg/mL  Bayer DCA Hb A1c Waived (STAT)  Result Value Ref Range   HB A1C (BAYER DCA - WAIVED) 6.2 <7.0 %  Basic Metabolic Panel (BMET)  Result Value Ref Range   Glucose 142 (H) 65 - 99 mg/dL   BUN 18 8 - 27 mg/dL   Creatinine, Ser 1.611.06 0.76 - 1.27 mg/dL   GFR calc non Af Amer 73 >59 mL/min/1.73   GFR calc Af Amer 84 >59 mL/min/1.73   BUN/Creatinine Ratio 17 10 - 24   Sodium 138 134 - 144 mmol/L   Potassium 4.4 3.5 - 5.2 mmol/L   Chloride 98 96 - 106 mmol/L   CO2 26 20 - 29 mmol/L   Calcium 10.1 8.6 - 10.2 mg/dL      Assessment & Plan:   Problem List Items Addressed This Visit      Musculoskeletal and Integument   Abrasion of right middle finger with infection    Improved on exam today, x one suture remained.  Removed suture.  Area crusted with minimal erythema.  Return if worsening or drainage present.        Other   Hyperlipidemia - Primary    Chronic, stable.  Lipid panel today improved from July labs.  Continue Crestor.      Relevant Orders   LP+ALT+AST Piccolo, Waived       Follow up plan: Return if symptoms worsen or fail to improve.

## 2018-03-01 NOTE — Patient Instructions (Signed)

## 2018-03-01 NOTE — Assessment & Plan Note (Signed)
Improved on exam today, x one suture remained.  Removed suture.  Area crusted with minimal erythema.  Return if worsening or drainage present.

## 2018-04-15 LAB — HM DIABETES EYE EXAM

## 2018-04-29 ENCOUNTER — Telehealth: Payer: Self-pay

## 2018-04-29 ENCOUNTER — Other Ambulatory Visit: Payer: Self-pay | Admitting: Nurse Practitioner

## 2018-04-29 MED ORDER — GABAPENTIN 100 MG PO CAPS: 100.0000 mg | ORAL_CAPSULE | Freq: Three times a day (TID) | ORAL | 3 refills | Status: DC

## 2018-04-29 NOTE — Telephone Encounter (Signed)
Refill on gabapentin °

## 2018-04-29 NOTE — Progress Notes (Signed)
Gabapentin refill sent

## 2018-08-10 ENCOUNTER — Ambulatory Visit: Payer: Medicare Other | Admitting: Nurse Practitioner

## 2018-08-24 ENCOUNTER — Telehealth: Payer: Self-pay | Admitting: Nurse Practitioner

## 2018-08-24 ENCOUNTER — Ambulatory Visit (INDEPENDENT_AMBULATORY_CARE_PROVIDER_SITE_OTHER): Payer: Medicare Other | Admitting: Nurse Practitioner

## 2018-08-24 ENCOUNTER — Other Ambulatory Visit: Payer: Self-pay

## 2018-08-24 ENCOUNTER — Encounter: Payer: Self-pay | Admitting: Nurse Practitioner

## 2018-08-24 VITALS — BP 109/70 | HR 61 | Temp 98.6°F | Ht 66.0 in | Wt 175.0 lb

## 2018-08-24 DIAGNOSIS — I10 Essential (primary) hypertension: Secondary | ICD-10-CM

## 2018-08-24 DIAGNOSIS — E1169 Type 2 diabetes mellitus with other specified complication: Secondary | ICD-10-CM

## 2018-08-24 DIAGNOSIS — Z1159 Encounter for screening for other viral diseases: Secondary | ICD-10-CM

## 2018-08-24 DIAGNOSIS — E119 Type 2 diabetes mellitus without complications: Secondary | ICD-10-CM

## 2018-08-24 DIAGNOSIS — M25511 Pain in right shoulder: Secondary | ICD-10-CM

## 2018-08-24 DIAGNOSIS — E785 Hyperlipidemia, unspecified: Secondary | ICD-10-CM

## 2018-08-24 DIAGNOSIS — E782 Mixed hyperlipidemia: Secondary | ICD-10-CM

## 2018-08-24 DIAGNOSIS — G8929 Other chronic pain: Secondary | ICD-10-CM

## 2018-08-24 DIAGNOSIS — K222 Esophageal obstruction: Secondary | ICD-10-CM

## 2018-08-24 DIAGNOSIS — K219 Gastro-esophageal reflux disease without esophagitis: Secondary | ICD-10-CM

## 2018-08-24 DIAGNOSIS — E1140 Type 2 diabetes mellitus with diabetic neuropathy, unspecified: Secondary | ICD-10-CM

## 2018-08-24 LAB — BAYER DCA HB A1C WAIVED: HB A1C (BAYER DCA - WAIVED): 6.6 % (ref <7.0)

## 2018-08-24 MED ORDER — LISINOPRIL 10 MG PO TABS: 10.0000 mg | ORAL_TABLET | Freq: Every day | ORAL | 3 refills | Status: DC

## 2018-08-24 MED ORDER — OMEPRAZOLE 20 MG PO CPDR: 20.0000 mg | DELAYED_RELEASE_CAPSULE | Freq: Every day | ORAL | 3 refills | Status: DC

## 2018-08-24 MED ORDER — AMLODIPINE BESYLATE 5 MG PO TABS: 5.0000 mg | ORAL_TABLET | Freq: Every day | ORAL | 3 refills | Status: DC

## 2018-08-24 MED ORDER — ONETOUCH ULTRA BLUE VI STRP: 1.0000 | ORAL_STRIP | Freq: Three times a day (TID) | 1 refills | Status: DC

## 2018-08-24 MED ORDER — ONETOUCH ULTRA 2 W/DEVICE KIT: 1.0000 | PACK | Freq: Three times a day (TID) | 0 refills | Status: AC

## 2018-08-24 MED ORDER — ROSUVASTATIN CALCIUM 40 MG PO TABS: 40.0000 mg | ORAL_TABLET | Freq: Every day | ORAL | 3 refills | Status: DC

## 2018-08-24 MED ORDER — METFORMIN HCL 500 MG PO TABS: 1000.0000 mg | ORAL_TABLET | Freq: Two times a day (BID) | ORAL | 3 refills | Status: DC

## 2018-08-24 MED ORDER — GABAPENTIN 100 MG PO CAPS: 100.0000 mg | ORAL_CAPSULE | Freq: Three times a day (TID) | ORAL | 3 refills | Status: DC

## 2018-08-24 MED ORDER — BLOOD GLUCOSE MONITOR KIT: PACK | 0 refills | Status: DC

## 2018-08-24 NOTE — Assessment & Plan Note (Signed)
Chronic, present for years.  He agrees to imaging, xray.  Does not want to do PT at this time.  Recommend Tylenol as needed for discomfort.  Gentle stretches at home and alternate heat/ice when pain present.  Return for worsening or continued symptoms.

## 2018-08-24 NOTE — Patient Instructions (Addendum)
[address] [address], [place], [place] [id number] [phone]   Rotator Cuff Tendinitis  Rotator cuff tendinitis is inflammation of the tough, cord-like bands that connect muscle to bone (tendons) in the rotator cuff. The rotator cuff includes all of the muscles and tendons that connect the arm to the shoulder. The rotator cuff holds the head of the upper arm bone (humerus) in the cup (fossa) of the shoulder blade (scapula). This condition can lead to a long-lasting (chronic) tear. The tear may be partial or complete. What are the causes? This condition is usually caused by overusing the rotator cuff. What increases the risk? This condition is more likely to develop in athletes and workers who frequently use their shoulder or reach over their heads. This can include activities such as:  Tennis.  Baseball or softball.  Swimming.  Construction work.  Painting. What are the signs or symptoms? Symptoms of this condition include:  Pain spreading (radiating) from the shoulder to the upper arm.  Swelling and tenderness in front of the shoulder.  Pain when reaching, pulling, or lifting the arm above the head.  Pain when lowering the arm from above the head.  Minor pain in the shoulder when resting.  Increased pain in the shoulder at night.  Difficulty placing the arm behind the back. How is this diagnosed? This condition is diagnosed with a medical history and physical exam. Tests may also be done, including:  X-rays.  MRI.  Ultrasounds.  CT or MR arthrogram. During this test, a contrast material is injected and then images are taken. How is this treated? Treatment for this condition depends on the severity of the condition. In less severe cases, treatment may include:  Rest. This may be done with a sling that holds the shoulder still (immobilization). Your health care provider may also recommend avoiding activities that involve lifting your arm over your head.  Icing  the shoulder.  Anti-inflammatory medicines, such as aspirin or ibuprofen. In more severe cases, treatment may include:  Physical therapy.  Steroid injections.  Surgery. Follow these instructions at home: If you have a sling:  Wear the sling as told by your health care provider. Remove it only as told by your health care provider.  Loosen the sling if your fingers tingle, become numb, or turn cold and blue.  Keep the sling clean.  If the sling is not waterproof, do not let it get wet. Remove it, if allowed, or cover it with a watertight covering when you take a bath or shower. Managing pain, stiffness, and swelling  If directed, put ice on the injured area. ? If you have a removable sling, remove it as told by your health care provider. ? Put ice in a plastic bag. ? Place a towel between your skin and the bag. ? Leave the ice on for 20 minutes, 2-3 times a day.  Move your fingers often to avoid stiffness and to lessen swelling.  Raise (elevate) the injured area above the level of your heart while you are lying down.  Find a comfortable sleeping position or sleep on a recliner, if available. Driving  Do not drive or use heavy machinery while taking prescription pain medicine.  Ask your health care provider when it is safe to drive if you have a sling on your arm. Activity  Rest your shoulder as told by your health care provider.  Return to your normal activities as told by your health care provider. Ask your health care provider what  activities are safe for you.  Do any exercises or stretches as told by your health care provider.  If you do repetitive overhead tasks, take small breaks in between and include stretching exercises as told by your health care provider. General instructions  Do not use any products that contain nicotine or tobacco, such as cigarettes and e-cigarettes. These can delay healing. If you need help quitting, ask your health care provider.  Take  over-the-counter and prescription medicines only as told by your health care provider.  Keep all follow-up visits as told by your health care provider. This is important. Contact a health care provider if:  Your pain gets worse.  You have new pain in your arm, hands, or fingers.  Your pain is not relieved with medicine or does not get better after 6 weeks of treatment.  You have cracking sensations when moving your shoulder in certain directions.  You hear a snapping sound after using your shoulder, followed by severe pain and weakness. Get help right away if:  Your arm, hand, or fingers are numb or tingling.  Your arm, hand, or fingers are swollen or painful or they turn white or blue. Summary  Rotator cuff tendinitis is inflammation of the tough, cord-like bands that connect muscle to bone (tendons) in the rotator cuff.  This condition is usually caused by overusing the rotator cuff, which includes all of the muscles and tendons that connect the arm to the shoulder.  This condition is more likely to develop in athletes and workers who frequently use their shoulder or reach over their heads.  Treatment generally includes rest, anti-inflammatory medicines, and icing. In some cases, physical therapy and steroid injections may be needed. In severe cases, surgery may be needed. This information is not intended to replace advice given to you by your health care provider. Make sure you discuss any questions you have with your health care provider. Document Released: 06/20/2003 Document Revised: 03/16/2016 Document Reviewed: 03/16/2016 Elsevier Interactive Patient Education  2019 [employer].

## 2018-08-24 NOTE — Assessment & Plan Note (Signed)
Chronic, ongoing.  Continue current medication regimen.  BP readings below goal at home and in office.   

## 2018-08-24 NOTE — Assessment & Plan Note (Signed)
Chronic, ongoing.  A1C 6.6% today.  He reports minimal use of Gabapentin.  Continue current medication regimen.

## 2018-08-24 NOTE — Telephone Encounter (Signed)
Copied from [place] [id number]. Topic: Quick Communication - Rx Refill/Question >> Aug 24, 2018  2:29 PM [place], [place] D wrote: Medication: [facility] stated that they received multiple rx's for pt's test strips/lancets and meter kit and there is conflicting information and directions. They need to speak with some to confirm all directions before filling. Please advise. CB# [phone]  Has the patient contacted their pharmacy? Yes.   (Agent: If no, request that the patient contact the pharmacy for the refill.) (Agent: If yes, when and what did the pharmacy advise?)  Preferred Pharmacy (with phone number or street name): [facility], [place] [phone] (Phone) [phone] (Fax)    Agent: Please be advised that RX refills may take up to 3 business days. We ask that you follow-up with your pharmacy.

## 2018-08-24 NOTE — Telephone Encounter (Signed)
Can you call [facility] again tomorrow to clarify meter order.  He needs one touch meter with strips and lancets.  Needs enough to check blood sugar twice a day.

## 2018-08-24 NOTE — Progress Notes (Signed)
BP 109/70   Pulse 61   Temp 98.6 F (37 C) (Oral)   Ht 5\' 6"  (1.676 m)   Wt 175 lb (79.4 kg)   SpO2 96%   BMI 28.25 kg/m    Subjective:    Patient ID: Lawrence Jensen, male    DOB: 02-02-1951, 68 y.o.   MRN: [id number]  HPI: Lawrence Jensen is a 68 y.o. male  Chief Complaint  Patient presents with  . Diabetes    8283m f/u. pt would like a cologuard test  . Hypertension  . Shoulder Pain    right shoulder blade x a few years   DIABETES Takes Metformin 1000 Mg two times a day.  Last A1C in October 2019 was 6.2%.  He reports he "tries" to eat a good diet, but occasionally eats sweets. Hypoglycemic episodes:yes Polydipsia/polyuria: no Visual disturbance: no Chest pain: no Paresthesias: no Glucose Monitoring: yes  Accucheck frequency: Daily  Fasting glucose: 140-160's in the morning  Post prandial:  Evening:  Before meals: Taking Insulin?: no  Long acting insulin:  Short acting insulin: Retinal Examination: Up to Date Foot Exam: Up to Date Pneumovax: Up to Date Influenza: Up to Date Aspirin: yes   HYPERTENSION / HYPERLIPIDEMIA Lisinopril and Amlodipine + Crestor daily. Satisfied with current treatment? yes Duration of hypertension: chronic BP monitoring frequency: daily BP range: 117-130/70's BP medication side effects: no Duration of hyperlipidemia: chronic Cholesterol medication side effects: no Cholesterol supplements: none Medication compliance: good compliance Aspirin: yes Recent stressors: no Recurrent headaches: no Visual changes: no Palpitations: no Dyspnea: no Chest pain: no Lower extremity edema: no Dizzy/lightheaded: no   RIGHT SHOULDER PAIN Present for a few years and "it seems to have gotten a little worse".  Has been trying to exercise and can not do upper body due to discomfort.  States it is "not something I can't live with, there are just things that nag it". Duration: chronic Involved shoulder: right , dominant arm Mechanism  of injury: unknown Location: posterior Onset:gradual Severity: 7/10 at worst intermittent Quality:  sharp Frequency: intermittent Radiation: no Aggravating factors: at times in bed it wakes him up, lifting and movement  Alleviating factors: heat strips  Status: worse Treatments attempted: heat  Relief with NSAIDs?:  No NSAIDs Taken Weakness: no Numbness: no Decreased grip strength: no Redness: no Swelling: no Bruising: no Fevers: no  Relevant past medical, surgical, family and social history reviewed and updated as indicated. Interim medical history since our last visit reviewed. Allergies and medications reviewed and updated.  Review of Systems  Constitutional: Negative for activity change, diaphoresis, fatigue and fever.  Respiratory: Negative for cough, chest tightness, shortness of breath and wheezing.   Cardiovascular: Negative for chest pain, palpitations and leg swelling.  Gastrointestinal: Negative for abdominal distention, abdominal pain, constipation, diarrhea, nausea and vomiting.  Endocrine: Negative for cold intolerance, heat intolerance, polydipsia, polyphagia and polyuria.  Musculoskeletal: Positive for arthralgias.  Skin: Negative.   Neurological: Negative for dizziness, syncope, weakness, light-headedness, numbness and headaches.  Psychiatric/Behavioral: Negative.     Per HPI unless specifically indicated above     Objective:    BP 109/70   Pulse 61   Temp 98.6 F (37 C) (Oral)   Ht 5\' 6"  (1.676 m)   Wt 175 lb (79.4 kg)   SpO2 96%   BMI 28.25 kg/m   Wt Readings from Last 3 Encounters:  08/24/18 175 lb (79.4 kg)  03/01/18 179 lb 2 oz (81.3 kg)  02/21/18 178  lb (80.7 kg)    Physical Exam Vitals signs and nursing note reviewed.  Constitutional:      Appearance: He is well-developed.  HENT:     Head: Normocephalic and atraumatic.     Right Ear: Hearing normal. No drainage.     Left Ear: Hearing normal. No drainage.     Mouth/Throat:      Pharynx: Uvula midline.  Eyes:     General: Lids are normal.        Right eye: No discharge.        Left eye: No discharge.     Conjunctiva/sclera: Conjunctivae normal.     Pupils: Pupils are equal, round, and reactive to light.  Neck:     Musculoskeletal: Normal range of motion and neck supple.     Thyroid: No thyromegaly.     Vascular: No carotid bruit or JVD.     Trachea: Trachea normal.  Cardiovascular:     Rate and Rhythm: Normal rate and regular rhythm.     Heart sounds: Normal heart sounds, S1 normal and S2 normal. No murmur. No gallop.   Pulmonary:     Effort: Pulmonary effort is normal.     Breath sounds: Normal breath sounds.  Abdominal:     General: Bowel sounds are normal.     Palpations: Abdomen is soft. There is no hepatomegaly or splenomegaly.  Musculoskeletal: Normal range of motion.     Right lower leg: No edema.     Left lower leg: No edema.  Skin:    General: Skin is warm and dry.     Capillary Refill: Capillary refill takes less than 2 seconds.     Findings: No rash.  Neurological:     Mental Status: He is alert and oriented to person, place, and time.     Deep Tendon Reflexes: Reflexes are normal and symmetric.  Psychiatric:        Mood and Affect: Mood normal.        Behavior: Behavior normal.        Thought Content: Thought content normal.        Judgment: Judgment normal.       Shoulder: right    Inspection:  no swelling, ecchymosis, erythema or step off deformity.  Swelling: no  Ecchymosis: none  Erythema:none  Deformity: none      Tenderness to Palpation:    Acromion: no    AC joint:no    Clavicle: no    Bicipital groove: no    Scapular spine: no    Coracoid process: no    Humeral head: no    Supraspinatus tendon: yes     Range of Motion:  Full Range of Motion bilaterally    Abduction:Normal    Adduction: Normal    Flexion: Normal    Extension: Decreased    Internal rotation: Normal    External rotation: Normal    Painful arc: no      Muscle Strength: 5/5 bilaterally    Flexion: Normal    Extension: Normal    Abduction: Normal    Adduction: Normal    External rotation: Normal    Internal rotation: Normal     Neuro: Sensation WNL.     Special Tests:     Neer sign: Negative    Hawkins sign: Negative     Empty Can: Negative   Results for orders placed or performed in visit on 04/18/18  HM DIABETES EYE EXAM  Result Value Ref Range  HM Diabetic Eye Exam No Retinopathy No Retinopathy      Assessment & Plan:   Problem List Items Addressed This Visit      Cardiovascular and Mediastinum   Hypertension    Chronic, ongoing.  Continue current medication regimen.  BP readings below goal at home and in office.      Relevant Medications   rosuvastatin (CRESTOR) 40 MG tablet   lisinopril (ZESTRIL) 10 MG tablet   amLODipine (NORVASC) 5 MG tablet   Other Relevant Orders   Comprehensive metabolic panel     Digestive   GERD with stricture   Relevant Orders   Magnesium     Endocrine   Diabetes mellitus without complication (HCC) - Primary    Chronic, ongoing.  A1C today 6.6%.  Continue current medication regimen.  Return in 3 months.      Relevant Medications   rosuvastatin (CRESTOR) 40 MG tablet   metFORMIN (GLUCOPHAGE) 500 MG tablet   lisinopril (ZESTRIL) 10 MG tablet   Other Relevant Orders   Bayer DCA Hb A1c Waived   Type 2 diabetes mellitus with diabetic neuropathy, without long-term current use of insulin (HCC)    Chronic, ongoing.  A1C 6.6% today.  He reports minimal use of Gabapentin.  Continue current medication regimen.        Relevant Medications   rosuvastatin (CRESTOR) 40 MG tablet   metFORMIN (GLUCOPHAGE) 500 MG tablet   lisinopril (ZESTRIL) 10 MG tablet   Hyperlipidemia associated with type 2 diabetes mellitus (HCC)    Chronic, ongoing.  Continue current medication regimen.  Lipid panel and CMP today.      Relevant Medications   rosuvastatin (CRESTOR) 40 MG tablet   metFORMIN  (GLUCOPHAGE) 500 MG tablet   lisinopril (ZESTRIL) 10 MG tablet     Other   Chronic right shoulder pain    Chronic, present for years.  He agrees to imaging, xray.  Does not want to do PT at this time.  Recommend Tylenol as needed for discomfort.  Gentle stretches at home and alternate heat/ice when pain present.  Return for worsening or continued symptoms.      Relevant Medications   gabapentin (NEURONTIN) 100 MG capsule   Other Relevant Orders   DG Shoulder Right   RESOLVED: Hyperlipidemia   Relevant Medications   rosuvastatin (CRESTOR) 40 MG tablet   lisinopril (ZESTRIL) 10 MG tablet   amLODipine (NORVASC) 5 MG tablet   Other Relevant Orders   Comprehensive metabolic panel   Lipid Panel w/o Chol/HDL Ratio    Other Visit Diagnoses    Need for hepatitis C screening test       Relevant Orders   Hepatitis C antibody       Follow up plan: Return in about 3 months (around 11/24/2018) for T2DM, HTN/HLD, GERD.

## 2018-08-24 NOTE — Telephone Encounter (Signed)
Called pt to schedule virtual appt for tomorrow, no answer, left voicemail.

## 2018-08-24 NOTE — Assessment & Plan Note (Signed)
Chronic, ongoing.  Continue current medication regimen.  Lipid panel and CMP today. 

## 2018-08-24 NOTE — Assessment & Plan Note (Signed)
Chronic, ongoing.  A1C today 6.6%.  Continue current medication regimen.  Return in 3 months.

## 2018-08-25 ENCOUNTER — Ambulatory Visit
Admission: RE | Admit: 2018-08-25 | Discharge: 2018-08-25 | Disposition: A | Discharge status: Home / Self Care | Payer: Medicare Other | Source: Ambulatory Visit | Attending: Nurse Practitioner | Admitting: Nurse Practitioner

## 2018-08-25 ENCOUNTER — Ambulatory Visit: Payer: Medicare Other | Admitting: Nurse Practitioner

## 2018-08-25 ENCOUNTER — Ambulatory Visit
Admission: RE | Admit: 2018-08-25 | Discharge: 2018-08-25 | Disposition: A | Discharge status: Home / Self Care | Payer: Medicare Other | Attending: Nurse Practitioner | Admitting: Nurse Practitioner

## 2018-08-25 DIAGNOSIS — M25511 Pain in right shoulder: Secondary | ICD-10-CM | POA: Diagnosis present

## 2018-08-25 DIAGNOSIS — G8929 Other chronic pain: Secondary | ICD-10-CM | POA: Insufficient documentation

## 2018-08-25 LAB — LIPID PANEL W/O CHOL/HDL RATIO
Cholesterol, Total: 199 mg/dL (ref 100–199)
HDL: 47 mg/dL (ref >39)
LDL Calculated: 110 mg/dL — ABNORMAL HIGH (ref 0–99)
Triglycerides: 208 mg/dL — ABNORMAL HIGH (ref 0–149)
VLDL Cholesterol Cal: 42 mg/dL — ABNORMAL HIGH (ref 5–40)

## 2018-08-25 LAB — MAGNESIUM: Magnesium: 2.2 mg/dL (ref 1.6–2.3)

## 2018-08-25 LAB — COMPREHENSIVE METABOLIC PANEL
ALT: 15 IU/L (ref 0–44)
AST: 18 IU/L (ref 0–40)
Albumin/Globulin Ratio: 1.7 (ref 1.2–2.2)
Albumin: 4.5 g/dL (ref 3.8–4.8)
Alkaline Phosphatase: 83 IU/L (ref 39–117)
BUN/Creatinine Ratio: 16 (ref 10–24)
BUN: 16 mg/dL (ref 8–27)
Bilirubin Total: 0.3 mg/dL (ref 0.0–1.2)
CO2: 23 mmol/L (ref 20–29)
Calcium: 10.4 mg/dL — ABNORMAL HIGH (ref 8.6–10.2)
Chloride: 100 mmol/L (ref 96–106)
Creatinine, Ser: 0.99 mg/dL (ref 0.76–1.27)
GFR calc Af Amer: 91 mL/min/{1.73_m2} (ref >59)
GFR calc non Af Amer: 78 mL/min/{1.73_m2} (ref >59)
Globulin, Total: 2.6 g/dL (ref 1.5–4.5)
Glucose: 103 mg/dL — ABNORMAL HIGH (ref 65–99)
Potassium: 4.6 mmol/L (ref 3.5–5.2)
Sodium: 140 mmol/L (ref 134–144)
Total Protein: 7.1 g/dL (ref 6.0–8.5)

## 2018-08-25 LAB — HEPATITIS C ANTIBODY: Hep C Virus Ab: <0.1 s/co ratio (ref 0.0–0.9)

## 2018-08-25 NOTE — Telephone Encounter (Signed)
He should be checking his blood sugar twice a day.  Please let them know these are current directions.  Thank you.:)

## 2018-08-25 NOTE — Telephone Encounter (Signed)
Called ans spoke with [facility] pharmacist. They stated that they have available Accu-Check meter that is covered by patient's insurance but the directions given on the previous prescription does not match to this brand. Please provide the directions for how many times pt will need to check his blood sugar and I will call them back to let them know.

## 2018-08-26 NOTE — Telephone Encounter (Signed)
Thank you :)

## 2018-08-26 NOTE — Telephone Encounter (Signed)
Called and spoke to [facility] pharmacist. Jolene's message relayed. Pharmacist stated that a new Rx should be faxed over or varbally called in and cancel the previous RX.  A Rx for Accu-Chek Aviva Plus, test strips and lancets was called in (verbal order from [facility]) Patient's insurance will cover this. Patient notified

## 2018-11-23 ENCOUNTER — Ambulatory Visit: Payer: Medicare Other

## 2018-11-24 NOTE — Progress Notes (Signed)
Subjective:   Lawrence Jensen is a 68 y.o. male who presents for Medicare Annual/Subsequent preventive examination.  This visit is being conducted via phone call  - after an attmept to do on video chat - due to the COVID-19 pandemic. This patient has given me verbal consent via phone to conduct this visit, patient states they are participating from their home address. Some vital signs may be absent or patient reported.   Patient identification: identified by name, DOB, and current address.    Review of Systems:   Cardiac Risk Factors include: advanced age (>49mn, >>67women);male gender;diabetes mellitus;dyslipidemia;hypertension     Objective:    Vitals: BP 127/74 Comment: pt reported  Pulse 63 Comment: pt reported  Ht 5' 6"  (1.676 m) Comment: pt reported  Wt 167 lb (75.8 kg) Comment: pt reported  BMI 26.95 kg/m   Body mass index is 26.95 kg/m.  Advanced Directives 11/28/2018 02/04/2018 11/17/2017 02/02/2017 11/02/2016  Does Patient Have a Medical Advance Directive? Yes No No No No  Would patient like information on creating a medical advance directive? - No - Patient declined Yes (MAU/Ambulatory/Procedural Areas - Information given) - No - Patient declined    Tobacco Social History   Tobacco Use  Smoking Status Former Smoker  . Packs/day: 1.00  . Years: 35.00  . Pack years: 35.00  . Types: Cigarettes  . Quit date: 04/03/1995  . Years since quitting: 23.6  Smokeless Tobacco Never Used     Counseling given: Not Answered   Clinical Intake:  Pre-visit preparation completed: Yes  Pain : No/denies pain     Nutritional Status: BMI 25 -29 Overweight Nutritional Risks: None Diabetes: No  How often do you need to have someone help you when you read instructions, pamphlets, or other written materials from your doctor or pharmacy?: 1 - Never  Nutrition Risk Assessment:  Has the patient had any N/V/D within the last 2 months?  No  Does the patient have any  non-healing wounds?  No  Has the patient had any unintentional weight loss or weight gain?  No   Diabetes:  Is the patient diabetic?  No  If diabetic, was a CBG obtained today?  Checked at home- 162 Did the patient bring in their glucometer from home?  No  How often do you monitor your CBG's? In the am    Financial Strains and Diabetes Management:  Are you having any financial strains with the device, your supplies or your medication? No .  Does the patient want to be seen by Chronic Care Management for management of their diabetes?  No  Would the patient like to be referred to a Nutritionist or for Diabetic Management?  No   Diabetic Exams:  Diabetic Eye Exam: Completed 04/15/2018. Overdue for diabetic eye exam. Pt has been advised about the importance in completing this exam  Diabetic Foot Exam: Completed 11/09/2017. Pt has been advised about the importance in completing this exam.  Interpreter Needed?: No  Information entered by :: Tiffany Hill,LPN  Past Medical History:  Diagnosis Date  . Diabetes mellitus without complication ([facility]   . ED (erectile dysfunction)   . Hyperlipidemia   . Hypertension    Past Surgical History:  Procedure Laterality Date  . APPENDECTOMY  1999   Family History  Problem Relation Age of Onset  . Heart disease Mother   . Heart disease Father   . Congestive Heart Failure Father   . Cancer Father  prostate  . Diabetes Father   . Stroke Father   . Hypertension Father   . Cancer Brother   . Stroke Maternal Grandmother   . Heart disease Brother   . COPD Neg Hx    Social History   Socioeconomic History  . Marital status: Married    Spouse name: Not on file  . Number of children: Not on file  . Years of education: Not on file  . Highest education level: Associate degree: academic program  Occupational History  . Occupation: retired  Scientific laboratory technician  . Financial resource strain: Not hard at all  . Food insecurity    Worry: Never  true    Inability: Never true  . Transportation needs    Medical: No    Non-medical: No  Tobacco Use  . Smoking status: Former Smoker    Packs/day: 1.00    Years: 35.00    Pack years: 35.00    Types: Cigarettes    Quit date: 04/03/1995    Years since quitting: 23.6  . Smokeless tobacco: Never Used  Substance and Sexual Activity  . Alcohol use: No  . Drug use: No  . Sexual activity: Never  Lifestyle  . Physical activity    Days per week: 7 days    Minutes per session: 40 min  . Stress: Not at all  Relationships  . Social Herbalist on phone: Twice a week    Gets together: More than three times a week    Attends religious service: More than 4 times per year    Active member of club or organization: No    Attends meetings of clubs or organizations: Never    Relationship status: Married  Other Topics Concern  . Not on file  Social History Narrative  . Not on file    Outpatient Encounter Medications as of 11/28/2018  Medication Sig  . amLODipine (NORVASC) 5 MG tablet Take 1 tablet (5 mg total) by mouth daily.  Marland Kitchen aspirin EC 81 MG tablet Take 81 mg by mouth daily.  . Blood Glucose Calibration (OT ULTRA/FASTTK CNTRL SOLN) SOLN   . blood glucose meter kit and supplies KIT Dispense based on patient and insurance preference. Use up to four times daily as directed. (FOR ICD-9 250.00, 250.01).  . Blood Glucose Monitoring Suppl (ONE TOUCH ULTRA 2) w/Device KIT Inject 1 kit into the skin 3 (three) times daily.  . COD LIVER OIL PO Take 3 tablets by mouth daily.  Marland Kitchen gabapentin (NEURONTIN) 100 MG capsule Take 1 capsule (100 mg total) by mouth 3 (three) times daily.  Marland Kitchen lisinopril (ZESTRIL) 10 MG tablet Take 1 tablet (10 mg total) by mouth daily.  . Magnesium 400 MG TABS Take 400 mg by mouth 2 (two) times daily.  . metFORMIN (GLUCOPHAGE) 500 MG tablet Take 2 tablets (1,000 mg total) by mouth 2 (two) times daily with a meal.  . ONE TOUCH ULTRA TEST test strip 1 each by Other route  3 (three) times daily.  . rosuvastatin (CRESTOR) 40 MG tablet Take 1 tablet (40 mg total) by mouth daily.   No facility-administered encounter medications on file as of 11/28/2018.     Activities of Daily Living In your present state of health, do you have any difficulty performing the following activities: 11/28/2018  Hearing? N  Comment no hearing aids  Vision? N  Comment no glasses  Walking or climbing stairs? Y  Comment uses hand rails  Dressing or bathing? N  Doing errands, shopping? N  Preparing Food and eating ? N  Using the Toilet? N  In the past six months, have you accidently leaked urine? N  Do you have problems with loss of bowel control? N  Managing your Medications? N  Managing your Finances? N  Housekeeping or managing your Housekeeping? N  Some recent data might be hidden    Patient Care Team: Venita Lick, NP as PCP - General (Nurse Practitioner)   Assessment:   This is a routine wellness examination for Lawrence Jensen.  Exercise Activities and Dietary recommendations Current Exercise Habits: Home exercise routine, Type of exercise: walking, Time (Minutes): 40, Frequency (Times/Week): 7, Weekly Exercise (Minutes/Week): 280, Intensity: Mild, Exercise limited by: None identified  Goals    . DIET - INCREASE WATER INTAKE     Recommend drinking at least 6-8 glasses of water a day        Fall Risk Fall Risk  11/28/2018 11/17/2017 11/09/2017 11/02/2016  Falls in the past year? 0 No No No   FALL RISK PREVENTION PERTAINING TO THE HOME:  Any stairs in or around the home? Yes  If so, are there any without handrails? No   Home free of loose throw rugs in walkways, pet beds, electrical cords, etc? Yes  Adequate lighting in your home to reduce risk of falls? Yes   ASSISTIVE DEVICES UTILIZED TO PREVENT FALLS:  Life alert? No  Use of a cane, walker or w/c? No  Grab bars in the bathroom? No  Shower chair or bench in shower? No  Elevated toilet seat or a handicapped  toilet? No    TIMED UP AND GO:  Unable to perform    Depression Screen PHQ 2/9 Scores 11/28/2018 11/17/2017 11/09/2017 11/02/2016  PHQ - 2 Score 0 0 0 0  PHQ- 9 Score - 3 1 0    Cognitive Function     6CIT Screen 11/17/2017  What Year? 0 points  What month? 0 points  What time? 0 points  Count back from 20 0 points  Months in reverse 0 points  Repeat phrase 0 points  Total Score 0    Immunization History  Administered Date(s) Administered  . Tdap 01/02/2014    Qualifies for Shingles Vaccine? Yes  Zostavax completed n/a. Due for Shingrix. Education has been provided regarding the importance of this vaccine. Pt has been advised to call insurance company to determine out of pocket expense. Advised may also receive vaccine at local pharmacy or Health Dept. Verbalized acceptance and understanding.  Tdap: up to date   Flu Vaccine: Due 12/2018, declined  Pneumococcal Vaccine: Due for Pneumococcal vaccine. Declined, Education has been provided regarding the importance of this vaccine but still declined. Advised may receive this vaccine at local pharmacy or Health Dept. Aware to provide a copy of the vaccination record if obtained from local pharmacy or Health Dept. Verbalized acceptance and understanding.   Screening Tests Health Maintenance  Topic Date Due  . FOOT EXAM  11/10/2018  . INFLUENZA VACCINE  11/12/2018  . COLONOSCOPY  08/24/2019 (Originally 07/21/2001)  . PNA vac Low Risk Adult (1 of 2 - PCV13) 11/28/2019 (Originally 07/21/2016)  . HEMOGLOBIN A1C  02/24/2019  . OPHTHALMOLOGY EXAM  04/16/2019  . TETANUS/TDAP  01/03/2024  . Hepatitis C Screening  Completed   Cancer Screenings:  Colorectal Screening: declined  Lung Cancer Screening: (Low Dose CT Chest recommended if Age 70-80 years, 30 pack-year currently smoking OR have quit w/in 15years.) does not qualify.  Additional Screening:  Hepatitis C Screening: does qualify; Completed 08/24/2018  Dental Screening:  Recommended annual dental exams for proper oral hygiene  Community Resource Referral:  CRR required this visit?  No        Plan:  I have personally reviewed and addressed the Medicare Annual Wellness questionnaire and have noted the following in the patient's chart:  A. Medical and social history B. Use of alcohol, tobacco or illicit drugs  C. Current medications and supplements D. Functional ability and status E.  Nutritional status F.  Physical activity G. Advance directives H. List of other physicians I.  Hospitalizations, surgeries, and ER visits in previous 12 months J.  [place] such as hearing and vision if needed, cognitive and depression L. Referrals and appointments   In addition, I have reviewed and discussed with patient certain preventive protocols, quality metrics, and best practice recommendations. A written personalized care plan for preventive services as well as general preventive health recommendations were provided to patient.   Signed,   Bevelyn Ngo, LPN  7/34/0370 Nurse Health Advisor  Nurse Notes: none

## 2018-11-28 ENCOUNTER — Ambulatory Visit (INDEPENDENT_AMBULATORY_CARE_PROVIDER_SITE_OTHER): Payer: Medicare Other

## 2018-11-28 VITALS — BP 127/74 | HR 63 | Ht 66.0 in | Wt 167.0 lb

## 2018-11-28 DIAGNOSIS — Z0000 Encounter for general adult medical examination without abnormal findings: Secondary | ICD-10-CM

## 2018-11-28 NOTE — Patient Instructions (Signed)
[name] , Thank you for taking time to come for your Medicare Wellness Visit. I appreciate your ongoing commitment to your health goals. Please review the following plan we discussed and let me know if I can assist you in the future.   Screening recommendations/referrals: Colonoscopy: no longer required  Recommended yearly ophthalmology/optometry visit for glaucoma screening and checkup Recommended yearly dental visit for hygiene and checkup  Vaccinations: Influenza vaccine: declined  Pneumococcal vaccine: declined Tdap vaccine: up to date Shingles vaccine: declined    Advanced directives: please pick up a copy of this information next time you are in the office   Conditions/risks identified: diabetic- discussed chronic care management team  Next appointment: follow up in one year for your annual wellness visit.   Preventive Care 5165 Years and Older, Male Preventive care refers to lifestyle choices and visits with your health care provider that can promote health and wellness. What does preventive care include?  A yearly physical exam. This is also called an annual well check.  Dental exams once or twice a year.  Routine eye exams. Ask your health care provider how often you should have your eyes checked.  Personal lifestyle choices, including:  Daily care of your teeth and gums.  Regular physical activity.  Eating a healthy diet.  Avoiding tobacco and drug use.  Limiting alcohol use.  Practicing safe sex.  Taking low doses of aspirin every day.  Taking vitamin and mineral supplements as recommended by your health care provider. What happens during an annual well check? The services and screenings done by your health care provider during your annual well check will depend on your age, overall health, lifestyle risk factors, and family history of disease. Counseling  Your health care provider may ask you questions about your:  Alcohol use.  Tobacco use.  Drug  use.  Emotional well-being.  Home and relationship well-being.  Sexual activity.  Eating habits.  History of falls.  Memory and ability to understand (cognition).  Work and work Astronome[employer]vironment. Screening  You may have the following tests or measurements:  Height, weight, and BMI.  Blood pressure.  Lipid and cholesterol levels. These may be checked every 5 years, or more frequently if you are over 67 years old.  Skin check.  Lung cancer screening. You may have this screening every year starting at age 67 if you have a 30-pack-year history of smoking and currently smoke or have quit within the past 15 years.  Fecal occult blood test (FOBT) of the stool. You may have this test every year starting at age 67.  Flexible sigmoidoscopy or colonoscopy. You may have a sigmoidoscopy every 5 years or a colonoscopy every 10 years starting at age 67.  Prostate cancer screening. Recommendations will vary depending on your family history and other risks.  Hepatitis C blood test.  Hepatitis B blood test.  Sexually transmitted disease (STD) testing.  Diabetes screening. This is done by checking your blood sugar (glucose) after you have not eaten for a while (fasting). You may have this done every 1-3 years.  Abdominal aortic aneurysm (AAA) screening. You may need this if you are a current or former smoker.  Osteoporosis. You may be screened starting at age 67 if you are at high risk. Talk with your health care provider about your test results, treatment options, and if necessary, the need for more tests. Vaccines  Your health care provider may recommend certain vaccines, such as:  Influenza vaccine. This is recommended every year.  Tetanus, diphtheria, and acellular pertussis (Tdap, Td) vaccine. You may need a Td booster every 10 years.  Zoster vaccine. You may need this after age 67.  Pneumococcal 13-valent conjugate (PCV13) vaccine. One dose is recommended after age 76.   Pneumococcal polysaccharide (PPSV23) vaccine. One dose is recommended after age 86. Talk to your health care provider about which screenings and vaccines you need and how often you need them. This information is not intended to replace advice given to you by your health care provider. Make sure you discuss any questions you have with your health care provider. Document Released: 04/26/2015 Document Revised: 12/18/2015 Document Reviewed: 01/29/2015 Elsevier Interactive Patient Education  2017 [employer] Prevention in the Home Falls can cause injuries. They can happen to people of all ages. There are many things you can do to make your home safe and to help prevent falls. What can I do on the outside of my home?  Regularly fix the edges of walkways and driveways and fix any cracks.  Remove anything that might make you trip as you walk through a door, such as a raised step or threshold.  Trim any bushes or trees on the path to your home.  Use bright outdoor lighting.  Clear any walking paths of anything that might make someone trip, such as rocks or tools.  Regularly check to see if handrails are loose or broken. Make sure that both sides of any steps have handrails.  Any raised decks and porches should have guardrails on the edges.  Have any leaves, snow, or ice cleared regularly.  Use sand or salt on walking paths during winter.  Clean up any spills in your garage right away. This includes oil or grease spills. What can I do in the bathroom?  Use night lights.  Install grab bars by the toilet and in the tub and shower. Do not use towel bars as grab bars.  Use non-skid mats or decals in the tub or shower.  If you need to sit down in the shower, use a plastic, non-slip stool.  Keep the floor dry. Clean up any water that spills on the floor as soon as it happens.  Remove soap buildup in the tub or shower regularly.  Attach bath mats securely with double-sided non-slip  rug tape.  Do not have throw rugs and other things on the floor that can make you trip. What can I do in the bedroom?  Use night lights.  Make sure that you have a light by your bed that is easy to reach.  Do not use any sheets or blankets that are too big for your bed. They should not hang down onto the floor.  Have a firm chair that has side arms. You can use this for support while you get dressed.  Do not have throw rugs and other things on the floor that can make you trip. What can I do in the kitchen?  Clean up any spills right away.  Avoid walking on wet floors.  Keep items that you use a lot in easy-to-reach places.  If you need to reach something above you, use a strong step stool that has a grab bar.  Keep electrical cords out of the way.  Do not use floor polish or wax that makes floors slippery. If you must use wax, use non-skid floor wax.  Do not have throw rugs and other things on the floor that can make you trip. What can I do  with my stairs?  Do not leave any items on the stairs.  Make sure that there are handrails on both sides of the stairs and use them. Fix handrails that are broken or loose. Make sure that handrails are as long as the stairways.  Check any carpeting to make sure that it is firmly attached to the stairs. Fix any carpet that is loose or worn.  Avoid having throw rugs at the top or bottom of the stairs. If you do have throw rugs, attach them to the floor with carpet tape.  Make sure that you have a light switch at the top of the stairs and the bottom of the stairs. If you do not have them, ask someone to add them for you. What else can I do to help prevent falls?  Wear shoes that:  Do not have high heels.  Have rubber bottoms.  Are comfortable and fit you well.  Are closed at the toe. Do not wear sandals.  If you use a stepladder:  Make sure that it is fully opened. Do not climb a closed stepladder.  Make sure that both sides of  the stepladder are locked into place.  Ask someone to hold it for you, if possible.  Clearly mark and make sure that you can see:  Any grab bars or handrails.  First and last steps.  Where the edge of each step is.  Use tools that help you move around (mobility aids) if they are needed. These include:  Canes.  Walkers.  Scooters.  Crutches.  Turn on the lights when you go into a dark area. Replace any light bulbs as soon as they burn out.  Set up your furniture so you have a clear path. Avoid moving your furniture around.  If any of your floors are uneven, fix them.  If there are any pets around you, be aware of where they are.  Review your medicines with your doctor. Some medicines can make you feel dizzy. This can increase your chance of falling. Ask your doctor what other things that you can do to help prevent falls. This information is not intended to replace advice given to you by your health care provider. Make sure you discuss any questions you have with your health care provider. Document Released: 01/24/2009 Document Revised: 09/05/2015 Document Reviewed: 05/04/2014 Elsevier Interactive Patient Education  2017 [employer].

## 2018-11-30 ENCOUNTER — Ambulatory Visit (INDEPENDENT_AMBULATORY_CARE_PROVIDER_SITE_OTHER): Payer: Medicare Other | Admitting: Nurse Practitioner

## 2018-11-30 ENCOUNTER — Other Ambulatory Visit: Payer: Self-pay

## 2018-11-30 ENCOUNTER — Encounter: Payer: Self-pay | Admitting: Nurse Practitioner

## 2018-11-30 VITALS — BP 114/75 | HR 54 | Temp 98.1°F | Ht 66.0 in | Wt 173.0 lb

## 2018-11-30 DIAGNOSIS — Z1211 Encounter for screening for malignant neoplasm of colon: Secondary | ICD-10-CM

## 2018-11-30 DIAGNOSIS — E785 Hyperlipidemia, unspecified: Secondary | ICD-10-CM

## 2018-11-30 DIAGNOSIS — E1169 Type 2 diabetes mellitus with other specified complication: Secondary | ICD-10-CM

## 2018-11-30 DIAGNOSIS — I10 Essential (primary) hypertension: Secondary | ICD-10-CM | POA: Diagnosis not present

## 2018-11-30 DIAGNOSIS — K219 Gastro-esophageal reflux disease without esophagitis: Secondary | ICD-10-CM | POA: Diagnosis not present

## 2018-11-30 DIAGNOSIS — E119 Type 2 diabetes mellitus without complications: Secondary | ICD-10-CM | POA: Diagnosis not present

## 2018-11-30 DIAGNOSIS — K222 Esophageal obstruction: Secondary | ICD-10-CM

## 2018-11-30 LAB — MICROALBUMIN, URINE WAIVED
Creatinine, Urine Waived: 100 mg/dL (ref 10–300)
Microalb, Ur Waived: 30 mg/L — ABNORMAL HIGH (ref 0–19)
Microalb/Creat Ratio: <30 mg/g (ref <30)

## 2018-11-30 LAB — BAYER DCA HB A1C WAIVED: HB A1C (BAYER DCA - WAIVED): 6.7 % (ref <7.0)

## 2018-11-30 NOTE — Assessment & Plan Note (Signed)
Chronic, ongoing.  Continue current medication regimen.  Lipid panel today. 

## 2018-11-30 NOTE — Progress Notes (Signed)
BP 114/75   Pulse (!) 54   Temp 98.1 F (36.7 C) (Oral)   Ht 5\' 6"  (1.676 m)   Wt 173 lb (78.5 kg)   SpO2 96%   BMI 27.92 kg/m    Subjective:    Patient ID: Lawrence Jensen, male    DOB: October 06, 1950, 68 y.o.   MRN: [id number]  HPI: Lawrence Jensen is a 68 y.o. male  Chief Complaint  Patient presents with  . Diabetes    7979m f/u  . Hypertension  . Hyperlipidemia  . Gastroesophageal Reflux   DIABETES Takes Metformin 1000 Mg two times a day.  Last A1C in May 6.6%.  He reports he  Normally eats a good diet, but occasionally eats sweets.  Has not been able to exercise as much due to humidity.  Continues on Gabapentin 100 MG TID.  Hypoglycemic episodes:no Polydipsia/polyuria: no Visual disturbance: no Chest pain: no Paresthesias: no Glucose Monitoring: yes  Accucheck frequency: Daily  Fasting glucose: 120-140 range  Post prandial:  Evening:  Before meals: Taking Insulin?: no  Long acting insulin:  Short acting insulin: Blood Pressure Monitoring: daily Retinal Examination: Up to Date Foot Exam: Up to Date Pneumovax: Up to Date Influenza: Up to Date Aspirin: yes   HYPERTENSION / HYPERLIPIDEMIA Continues on Lisinopril and Amlodipine + Crestor daily. Satisfied with current treatment? yes Duration of hypertension: chronic BP monitoring frequency: daily BP range: 110-130/70-80's at home BP medication side effects: no Duration of hyperlipidemia: chronic Cholesterol medication side effects: no Cholesterol supplements: fish oil Medication compliance: good compliance Aspirin: yes Recent stressors: no Recurrent headaches: no Visual changes: no Palpitations: no Dyspnea: no Chest pain: no Lower extremity edema: no Dizzy/lightheaded: no   GERD No current medications.  Discussed colonoscopy, as he has never had.  He is interested in Cologuard for screening, will order this. GERD control status: stable  Satisfied with current treatment? yes Heartburn  frequency: occasional about 3 times a month Previous GERD medications: none Antacid use frequency:  TUMS Duration: a few times a month Alleviatiating factors:  TUMS Aggravating factors: certain foods Dysphagia: no Odynophagia:  no Hematemesis: no Blood in stool: no EGD: no  Relevant past medical, surgical, family and social history reviewed and updated as indicated. Interim medical history since our last visit reviewed. Allergies and medications reviewed and updated.  Review of Systems  Constitutional: Negative for activity change, diaphoresis, fatigue and fever.  Respiratory: Negative for cough, chest tightness, shortness of breath and wheezing.   Cardiovascular: Negative for chest pain, palpitations and leg swelling.  Gastrointestinal: Negative for abdominal distention, abdominal pain, constipation, diarrhea, nausea and vomiting.  Endocrine: Negative for cold intolerance, heat intolerance, polydipsia, polyphagia and polyuria.  Neurological: Negative for dizziness, syncope, weakness, light-headedness, numbness and headaches.  Psychiatric/Behavioral: Negative.     Per HPI unless specifically indicated above     Objective:    BP 114/75   Pulse (!) 54   Temp 98.1 F (36.7 C) (Oral)   Ht 5\' 6"  (1.676 m)   Wt 173 lb (78.5 kg)   SpO2 96%   BMI 27.92 kg/m   Wt Readings from Last 3 Encounters:  11/30/18 173 lb (78.5 kg)  11/28/18 167 lb (75.8 kg)  08/24/18 175 lb (79.4 kg)    Physical Exam Vitals signs and nursing note reviewed.  Constitutional:      General: He is awake. He is not in acute distress.    Appearance: He is well-developed. He is not ill-appearing.  HENT:  Head: Normocephalic and atraumatic.     Right Ear: Hearing normal. No drainage.     Left Ear: Hearing normal. No drainage.  Eyes:     General: Lids are normal.        Right eye: No discharge.        Left eye: No discharge.     Conjunctiva/sclera: Conjunctivae normal.     Pupils: Pupils are equal,  round, and reactive to light.  Neck:     Musculoskeletal: Normal range of motion and neck supple.     Thyroid: No thyromegaly.     Vascular: No carotid bruit.  Cardiovascular:     Rate and Rhythm: Normal rate and regular rhythm.     Heart sounds: Normal heart sounds, S1 normal and S2 normal. No murmur. No gallop.   Pulmonary:     Effort: Pulmonary effort is normal. No accessory muscle usage or respiratory distress.     Breath sounds: Normal breath sounds.  Abdominal:     General: Bowel sounds are normal.     Palpations: Abdomen is soft. There is no hepatomegaly or splenomegaly.  Musculoskeletal: Normal range of motion.     Right lower leg: No edema.     Left lower leg: No edema.  Skin:    General: Skin is warm and dry.     Capillary Refill: Capillary refill takes less than 2 seconds.     Findings: No rash.  Neurological:     Mental Status: He is alert and oriented to person, place, and time.     Deep Tendon Reflexes: Reflexes are normal and symmetric.  Psychiatric:        Mood and Affect: Mood normal.        Behavior: Behavior normal. Behavior is cooperative.        Thought Content: Thought content normal.        Judgment: Judgment normal.     Results for orders placed or performed in visit on 08/24/18  Bayer DCA Hb A1c Waived  Result Value Ref Range   HB A1C (BAYER DCA - WAIVED) 6.6 <7.0 %  Magnesium  Result Value Ref Range   Magnesium 2.2 1.6 - 2.3 mg/dL  Comprehensive metabolic panel  Result Value Ref Range   Glucose 103 (H) 65 - 99 mg/dL   BUN 16 8 - 27 mg/dL   Creatinine, Ser 0.99 0.76 - 1.27 mg/dL   GFR calc non Af Amer 78 >59 mL/min/1.73   GFR calc Af Amer 91 >59 mL/min/1.73   BUN/Creatinine Ratio 16 10 - 24   Sodium 140 134 - 144 mmol/L   Potassium 4.6 3.5 - 5.2 mmol/L   Chloride 100 96 - 106 mmol/L   CO2 23 20 - 29 mmol/L   Calcium 10.4 (H) 8.6 - 10.2 mg/dL   Total Protein 7.1 6.0 - 8.5 g/dL   Albumin 4.5 3.8 - 4.8 g/dL   Globulin, Total 2.6 1.5 - 4.5  g/dL   Albumin/Globulin Ratio 1.7 1.2 - 2.2   Bilirubin Total 0.3 0.0 - 1.2 mg/dL   Alkaline Phosphatase 83 39 - 117 IU/L   AST 18 0 - 40 IU/L   ALT 15 0 - 44 IU/L  Lipid Panel w/o Chol/HDL Ratio  Result Value Ref Range   Cholesterol, Total 199 100 - 199 mg/dL   Triglycerides 208 (H) 0 - 149 mg/dL   HDL 47 >39 mg/dL   VLDL Cholesterol Cal 42 (H) 5 - 40 mg/dL   LDL Calculated 110 (H)  0 - 99 mg/dL  Hepatitis C antibody  Result Value Ref Range   Hep C Virus Ab <0.1 0.0 - 0.9 s/co ratio      Assessment & Plan:   Problem List Items Addressed This Visit      Cardiovascular and Mediastinum   Hypertension    Chronic, ongoing.  Continue current medication regimen.  BP readings below goal at home and in office.          Digestive   GERD with stricture    Chronic, stable without medication.  Cologuard ordered for screening.        Endocrine   Diabetes mellitus without complication (HCC) - Primary    Chronic, ongoing.  A1C 6.7% today.  Urine micro ALB 30 and A:C <30.  He reports minimal use of Gabapentin.  Continue current medication regimen.        Relevant Orders   Bayer DCA Hb A1c Waived   Microalbumin, Urine Waived   Hyperlipidemia associated with type 2 diabetes mellitus (HCC)    Chronic, ongoing.  Continue current medication regimen.  Lipid panel today.      Relevant Orders   Lipid Panel w/o Chol/HDL Ratio    Other Visit Diagnoses    Colon cancer screening       Relevant Orders   Cologuard       Follow up plan: Return in about 3 months (around 03/02/2019) for T2DM, HTN/HLD, ED.

## 2018-11-30 NOTE — Assessment & Plan Note (Signed)
Chronic, ongoing.  Continue current medication regimen.  BP readings below goal at home and in office.

## 2018-11-30 NOTE — Patient Instructions (Signed)
Carbohydrate Counting for Diabetes Mellitus, Adult  Carbohydrate counting is a method of keeping track of how many carbohydrates you eat. Eating carbohydrates naturally increases the amount of sugar (glucose) in the blood. Counting how many carbohydrates you eat helps keep your blood glucose within normal limits, which helps you manage your diabetes (diabetes mellitus). It is important to know how many carbohydrates you can safely have in each meal. This is different for every person. A diet and nutrition specialist (registered dietitian) can help you make a meal plan and calculate how many carbohydrates you should have at each meal and snack. Carbohydrates are found in the following foods:  Grains, such as breads and cereals.  Dried beans and soy products.  Starchy vegetables, such as potatoes, peas, and corn.  Fruit and fruit juices.  Milk and yogurt.  Sweets and snack foods, such as cake, cookies, candy, chips, and soft drinks. How do I count carbohydrates? There are two ways to count carbohydrates in food. You can use either of the methods or a combination of both. Reading "Nutrition Facts" on packaged food The "Nutrition Facts" list is included on the labels of almost all packaged foods and beverages in the [place] It includes:  The serving size.  Information about nutrients in each serving, including the grams (g) of carbohydrate per serving. To use the "Nutrition Facts":  Decide how many servings you will have.  Multiply the number of servings by the number of carbohydrates per serving.  The resulting number is the total amount of carbohydrates that you will be having. Learning standard serving sizes of other foods When you eat carbohydrate foods that are not packaged or do not include "Nutrition Facts" on the label, you need to measure the servings in order to count the amount of carbohydrates:  Measure the foods that you will eat with a food scale or measuring cup, if needed.   Decide how many standard-size servings you will eat.  Multiply the number of servings by 15. Most carbohydrate-rich foods have about 15 g of carbohydrates per serving. ? For example, if you eat 8 oz (170 g) of strawberries, you will have eaten 2 servings and 30 g of carbohydrates (2 servings x 15 g = 30 g).  For foods that have more than one food mixed, such as soups and casseroles, you must count the carbohydrates in each food that is included. The following list contains standard serving sizes of common carbohydrate-rich foods. Each of these servings has about 15 g of carbohydrates:   hamburger bun or  English muffin.   oz (15 mL) syrup.   oz (14 g) jelly.  1 slice of bread.  1 six-inch tortilla.  3 oz (85 g) cooked rice or pasta.  4 oz (113 g) cooked dried beans.  4 oz (113 g) starchy vegetable, such as peas, corn, or potatoes.  4 oz (113 g) hot cereal.  4 oz (113 g) mashed potatoes or  of a large baked potato.  4 oz (113 g) canned or frozen fruit.  4 oz (120 mL) fruit juice.  4-6 crackers.  6 chicken nuggets.  6 oz (170 g) unsweetened dry cereal.  6 oz (170 g) plain fat-free yogurt or yogurt sweetened with artificial sweeteners.  8 oz (240 mL) milk.  8 oz (170 g) fresh fruit or one small piece of fruit.  24 oz (680 g) popped popcorn. Example of carbohydrate counting Sample meal  3 oz (85 g) chicken breast.  6 oz (170 g)   brown rice.  4 oz (113 g) corn.  8 oz (240 mL) milk.  8 oz (170 g) strawberries with sugar-free whipped topping. Carbohydrate calculation 1. Identify the foods that contain carbohydrates: ? Rice. ? Corn. ? Milk. ? Strawberries. 2. Calculate how many servings you have of each food: ? 2 servings rice. ? 1 serving corn. ? 1 serving milk. ? 1 serving strawberries. 3. Multiply each number of servings by 15 g: ? 2 servings rice x 15 g = 30 g. ? 1 serving corn x 15 g = 15 g. ? 1 serving milk x 15 g = 15 g. ? 1 serving  strawberries x 15 g = 15 g. 4. Add together all of the amounts to find the total grams of carbohydrates eaten: ? 30 g + 15 g + 15 g + 15 g = 75 g of carbohydrates total. Summary  Carbohydrate counting is a method of keeping track of how many carbohydrates you eat.  Eating carbohydrates naturally increases the amount of sugar (glucose) in the blood.  Counting how many carbohydrates you eat helps keep your blood glucose within normal limits, which helps you manage your diabetes.  A diet and nutrition specialist (registered dietitian) can help you make a meal plan and calculate how many carbohydrates you should have at each meal and snack. This information is not intended to replace advice given to you by your health care provider. Make sure you discuss any questions you have with your health care provider. Document Released: 03/30/2005 Document Revised: 10/22/2016 Document Reviewed: 09/11/2015 Elsevier Patient Education  2020 [employer]  

## 2018-11-30 NOTE — Assessment & Plan Note (Signed)
Chronic, stable without medication.  Cologuard ordered for screening.

## 2018-11-30 NOTE — Assessment & Plan Note (Addendum)
Chronic, ongoing.  A1C 6.7% today.  Urine micro ALB 30 and A:C <30.  He reports minimal use of Gabapentin.  Continue current medication regimen.

## 2018-12-01 LAB — LIPID PANEL W/O CHOL/HDL RATIO
Cholesterol, Total: 110 mg/dL (ref 100–199)
HDL: 41 mg/dL
LDL Calculated: 50 mg/dL (ref 0–99)
Triglycerides: 95 mg/dL (ref 0–149)
VLDL Cholesterol Cal: 19 mg/dL (ref 5–40)

## 2018-12-01 NOTE — Progress Notes (Signed)
Normal test results noted.  Please call patient and make them aware of normal results and will continue to monitor at regular visits.  Have a great day.  Look forward to seeing you at your next visit.

## 2018-12-12 LAB — COLOGUARD: Cologuard: NEGATIVE

## 2019-02-09 ENCOUNTER — Telehealth: Payer: Self-pay | Admitting: Nurse Practitioner

## 2019-02-09 ENCOUNTER — Ambulatory Visit (INDEPENDENT_AMBULATORY_CARE_PROVIDER_SITE_OTHER): Payer: Medicare Other | Admitting: Nurse Practitioner

## 2019-02-09 ENCOUNTER — Ambulatory Visit
Admission: RE | Admit: 2019-02-09 | Discharge: 2019-02-09 | Disposition: A | Discharge status: Home / Self Care | Payer: Medicare Other | Source: Ambulatory Visit | Attending: Nurse Practitioner | Admitting: Nurse Practitioner

## 2019-02-09 ENCOUNTER — Encounter: Payer: Self-pay | Admitting: Nurse Practitioner

## 2019-02-09 ENCOUNTER — Other Ambulatory Visit: Payer: Self-pay

## 2019-02-09 VITALS — BP 117/73 | HR 62

## 2019-02-09 DIAGNOSIS — M25561 Pain in right knee: Secondary | ICD-10-CM | POA: Diagnosis present

## 2019-02-09 DIAGNOSIS — M25562 Pain in left knee: Secondary | ICD-10-CM | POA: Insufficient documentation

## 2019-02-09 DIAGNOSIS — G8929 Other chronic pain: Secondary | ICD-10-CM | POA: Insufficient documentation

## 2019-02-09 NOTE — Assessment & Plan Note (Signed)
Acute over past few months with worsening.  Will obtain imaging of right knee, suspect some OA.  Recommend obtaining a knee brace for when up walking.  Discussed need to cut back on [facility] and instead use Tylenol as needed, may take up to 1000 MG TID.  Recommend use of ice as needed + Voltaren gel or Icy/Hot.  If ongoing discomfort will discuss steroid injection to knee at upcoming visit + physical therapy.

## 2019-02-09 NOTE — Telephone Encounter (Signed)
Spoke to patient via telephone via telephone about his right knee imaging.  Imaging showing degenerative changes and a Bakers cyst.  Discussed referral to ortho for this, which he is agreeable to.

## 2019-02-09 NOTE — Progress Notes (Signed)
BP 117/73 Comment: pt reported  Pulse 62 Comment: pt reported   Subjective:    Patient ID: Lawrence Jensen, male    DOB: 28-Nov-1950, 68 y.o.   MRN: [id number]  HPI: Lawrence Jensen is a 68 y.o. male  Chief Complaint  Patient presents with  . Pain    pt states he has had R knee pain off and on for a few months, states the last few months have been more constant. States he has been taking the [employer] powder for his knee. States he also has a headache and thought he shouldn't if he is taking the [place] powders    . This visit was completed via telephone due to the restrictions of the COVID-19 pandemic. All issues as above were discussed and addressed but no physical exam was performed. If it was felt that the patient should be evaluated in the office, they were directed there. The patient verbally consented to this visit. Patient was unable to complete an audio/visual visit due to Lack of equipment. Due to the catastrophic nature of the COVID-19 pandemic, this visit was done through audio contact only. . Location of the patient: home . Location of the provider: home . Those involved with this call:  . Provider: Marnee Guarneri, DNP . CMA: Yvonna Alanis, CMA . Front Desk/Registration: Jill Side  . Time spent on call: 15 minutes on the phone discussing health concerns. 10 minutes total spent in review of patient's record and preparation of their chart.  . I verified patient identity using two factors (patient name and date of birth). Patient consents verbally to being seen via telemedicine visit today.    KNEE PAIN Reports his right knee has been hurting off/on for past few months, but over past few weeks has become worse.  Can not get comfortable at night.  Has been taking 2-3 BC a day, which dulls the pain, but when gets out to walk it is like walking on something numb and aching.  Recently his right hip started to hurt on occasion, which he feels is due to overcompensating  with his knee. Duration: months Involved knee: right Mechanism of injury: unknown Location:anterior Onset: gradual Severity: 6/10 at worst and 2/10 at best Quality:  dull and aching, occasionally sharper Frequency: intermittent Radiation: no Aggravating factors: weight bearing, walking, bending and movement  Alleviating factors: BC only  Status: fluctuating Treatments attempted: BC only  Relief with NSAIDs?:  moderate Weakness with weight bearing or walking: no Sensation of giving way: no Locking: no Popping: no Bruising: no Swelling: no Redness: no Paresthesias/decreased sensation: no Fevers: no  Relevant past medical, surgical, family and social history reviewed and updated as indicated. Interim medical history since our last visit reviewed. Allergies and medications reviewed and updated.  Review of Systems  Constitutional: Negative for activity change, diaphoresis, fatigue and fever.  Respiratory: Negative for cough, chest tightness, shortness of breath and wheezing.   Cardiovascular: Negative for chest pain, palpitations and leg swelling.  Gastrointestinal: Negative for abdominal distention, abdominal pain, constipation, diarrhea, nausea and vomiting.  Musculoskeletal: Positive for arthralgias.  Skin: Negative.   Psychiatric/Behavioral: Negative.     Per HPI unless specifically indicated above     Objective:    BP 117/73 Comment: pt reported  Pulse 62 Comment: pt reported  Wt Readings from Last 3 Encounters:  11/30/18 173 lb (78.5 kg)  11/28/18 167 lb (75.8 kg)  08/24/18 175 lb (79.4 kg)    Physical Exam   Unable to  perform physical exam due to telephone visit only.    Results for orders placed or performed in visit on 12/14/18  Cologuard  Result Value Ref Range   Cologuard Negative Negative      Assessment & Plan:   Problem List Items Addressed This Visit      Other   Anterior knee pain, right - Primary    Acute over past few months with  worsening.  Will obtain imaging of right knee, suspect some OA.  Recommend obtaining a knee brace for when up walking.  Discussed need to cut back on [facility] and instead use Tylenol as needed, may take up to 1000 MG TID.  Recommend use of ice as needed + Voltaren gel or Icy/Hot.  If ongoing discomfort will discuss steroid injection to knee at upcoming visit + physical therapy.        Relevant Orders   DG Knee Complete 4 Views Right      I discussed the assessment and treatment plan with the patient. The patient was provided an opportunity to ask questions and all were answered. The patient agreed with the plan and demonstrated an understanding of the instructions.   The patient was advised to call back or seek an in-person evaluation if the symptoms worsen or if the condition fails to improve as anticipated.   I provided 15 minutes of time during this encounter.  Follow up plan: Return as schedule upcoming in November.

## 2019-02-09 NOTE — Patient Instructions (Signed)

## 2019-03-03 ENCOUNTER — Telehealth: Payer: Self-pay | Admitting: Nurse Practitioner

## 2019-03-03 NOTE — Telephone Encounter (Signed)
He can come in office, that is fine.

## 2019-03-03 NOTE — Telephone Encounter (Signed)
Pt.notified

## 2019-03-03 NOTE — Telephone Encounter (Signed)
Called to set up virtual Pt refused, said he would rather come in office since he is diabetic Please advise.

## 2019-03-07 ENCOUNTER — Ambulatory Visit (INDEPENDENT_AMBULATORY_CARE_PROVIDER_SITE_OTHER): Payer: Medicare Other | Admitting: Nurse Practitioner

## 2019-03-07 ENCOUNTER — Encounter: Payer: Self-pay | Admitting: Nurse Practitioner

## 2019-03-07 ENCOUNTER — Other Ambulatory Visit: Payer: Self-pay

## 2019-03-07 VITALS — BP 138/82 | HR 55 | Temp 98.0°F

## 2019-03-07 DIAGNOSIS — E1169 Type 2 diabetes mellitus with other specified complication: Secondary | ICD-10-CM

## 2019-03-07 DIAGNOSIS — E1140 Type 2 diabetes mellitus with diabetic neuropathy, unspecified: Secondary | ICD-10-CM

## 2019-03-07 DIAGNOSIS — I10 Essential (primary) hypertension: Secondary | ICD-10-CM

## 2019-03-07 DIAGNOSIS — E785 Hyperlipidemia, unspecified: Secondary | ICD-10-CM | POA: Diagnosis not present

## 2019-03-07 LAB — BAYER DCA HB A1C WAIVED: HB A1C (BAYER DCA - WAIVED): 6.7 % (ref <7.0)

## 2019-03-07 MED ORDER — GABAPENTIN 100 MG PO CAPS: 100.0000 mg | ORAL_CAPSULE | Freq: Three times a day (TID) | ORAL | 3 refills | Status: DC

## 2019-03-07 MED ORDER — METFORMIN HCL 500 MG PO TABS: 1000.0000 mg | ORAL_TABLET | Freq: Two times a day (BID) | ORAL | 3 refills | Status: DC

## 2019-03-07 MED ORDER — LISINOPRIL 20 MG PO TABS: 20.0000 mg | ORAL_TABLET | Freq: Every day | ORAL | 3 refills | Status: DC

## 2019-03-07 MED ORDER — MELOXICAM 15 MG PO TABS: 15.0000 mg | ORAL_TABLET | ORAL | 0 refills | Status: DC | PRN

## 2019-03-07 MED ORDER — GLUCOSE BLOOD VI STRP: ORAL_STRIP | 12 refills | Status: DC

## 2019-03-07 MED ORDER — ROSUVASTATIN CALCIUM 40 MG PO TABS: 40.0000 mg | ORAL_TABLET | Freq: Every day | ORAL | 3 refills | Status: DC

## 2019-03-07 MED ORDER — AMLODIPINE BESYLATE 5 MG PO TABS: 5.0000 mg | ORAL_TABLET | Freq: Every day | ORAL | 3 refills | Status: DC

## 2019-03-07 NOTE — Assessment & Plan Note (Signed)
Chronic, ongoing.  A1C 6.7% today.  He reports no current use of Gabapentin.  Continue current medication regimen and adjust as needed.  Return in 3 months for annual physical.

## 2019-03-07 NOTE — Assessment & Plan Note (Signed)
Chronic, ongoing.  Continue current medication regimen and adjust as needed.  Lipid panel and CMP today.    

## 2019-03-07 NOTE — Assessment & Plan Note (Signed)
Chronic, ongoing.  Increase Lisinopril 20 MG due to elevations in BP above goal and have advised using Meloxicam minimally, continue Amlodipine at current dose.  Monitor BP at home daily.  CMP today.  Return in 3 months for annual physical.

## 2019-03-07 NOTE — Patient Instructions (Signed)
Carbohydrate Counting for Diabetes Mellitus, Adult  Carbohydrate counting is a method of keeping track of how many carbohydrates you eat. Eating carbohydrates naturally increases the amount of sugar (glucose) in the blood. Counting how many carbohydrates you eat helps keep your blood glucose within normal limits, which helps you manage your diabetes (diabetes mellitus). It is important to know how many carbohydrates you can safely have in each meal. This is different for every person. A diet and nutrition specialist (registered dietitian) can help you make a meal plan and calculate how many carbohydrates you should have at each meal and snack. Carbohydrates are found in the following foods:  Grains, such as breads and cereals.  Dried beans and soy products.  Starchy vegetables, such as potatoes, peas, and corn.  Fruit and fruit juices.  Milk and yogurt.  Sweets and snack foods, such as cake, cookies, candy, chips, and soft drinks. How do I count carbohydrates? There are two ways to count carbohydrates in food. You can use either of the methods or a combination of both. Reading "Nutrition Facts" on packaged food The "Nutrition Facts" list is included on the labels of almost all packaged foods and beverages in the [place] It includes:  The serving size.  Information about nutrients in each serving, including the grams (g) of carbohydrate per serving. To use the "Nutrition Facts":  Decide how many servings you will have.  Multiply the number of servings by the number of carbohydrates per serving.  The resulting number is the total amount of carbohydrates that you will be having. Learning standard serving sizes of other foods When you eat carbohydrate foods that are not packaged or do not include "Nutrition Facts" on the label, you need to measure the servings in order to count the amount of carbohydrates:  Measure the foods that you will eat with a food scale or measuring cup, if needed.   Decide how many standard-size servings you will eat.  Multiply the number of servings by 15. Most carbohydrate-rich foods have about 15 g of carbohydrates per serving. ? For example, if you eat 8 oz (170 g) of strawberries, you will have eaten 2 servings and 30 g of carbohydrates (2 servings x 15 g = 30 g).  For foods that have more than one food mixed, such as soups and casseroles, you must count the carbohydrates in each food that is included. The following list contains standard serving sizes of common carbohydrate-rich foods. Each of these servings has about 15 g of carbohydrates:   hamburger bun or  English muffin.   oz (15 mL) syrup.   oz (14 g) jelly.  1 slice of bread.  1 six-inch tortilla.  3 oz (85 g) cooked rice or pasta.  4 oz (113 g) cooked dried beans.  4 oz (113 g) starchy vegetable, such as peas, corn, or potatoes.  4 oz (113 g) hot cereal.  4 oz (113 g) mashed potatoes or  of a large baked potato.  4 oz (113 g) canned or frozen fruit.  4 oz (120 mL) fruit juice.  4-6 crackers.  6 chicken nuggets.  6 oz (170 g) unsweetened dry cereal.  6 oz (170 g) plain fat-free yogurt or yogurt sweetened with artificial sweeteners.  8 oz (240 mL) milk.  8 oz (170 g) fresh fruit or one small piece of fruit.  24 oz (680 g) popped popcorn. Example of carbohydrate counting Sample meal  3 oz (85 g) chicken breast.  6 oz (170 g)   brown rice.  4 oz (113 g) corn.  8 oz (240 mL) milk.  8 oz (170 g) strawberries with sugar-free whipped topping. Carbohydrate calculation 1. Identify the foods that contain carbohydrates: ? Rice. ? Corn. ? Milk. ? Strawberries. 2. Calculate how many servings you have of each food: ? 2 servings rice. ? 1 serving corn. ? 1 serving milk. ? 1 serving strawberries. 3. Multiply each number of servings by 15 g: ? 2 servings rice x 15 g = 30 g. ? 1 serving corn x 15 g = 15 g. ? 1 serving milk x 15 g = 15 g. ? 1 serving  strawberries x 15 g = 15 g. 4. Add together all of the amounts to find the total grams of carbohydrates eaten: ? 30 g + 15 g + 15 g + 15 g = 75 g of carbohydrates total. Summary  Carbohydrate counting is a method of keeping track of how many carbohydrates you eat.  Eating carbohydrates naturally increases the amount of sugar (glucose) in the blood.  Counting how many carbohydrates you eat helps keep your blood glucose within normal limits, which helps you manage your diabetes.  A diet and nutrition specialist (registered dietitian) can help you make a meal plan and calculate how many carbohydrates you should have at each meal and snack. This information is not intended to replace advice given to you by your health care provider. Make sure you discuss any questions you have with your health care provider. Document Released: 03/30/2005 Document Revised: 10/22/2016 Document Reviewed: 09/11/2015 Elsevier Patient Education  2020 [employer]  

## 2019-03-07 NOTE — Progress Notes (Signed)
BP 138/82 (BP Location: Left Arm, Patient Position: Sitting)   Pulse (!) 55   Temp 98 F (36.7 C) (Oral)   SpO2 98%    Subjective:    Patient ID: Lawrence Jensen, male    DOB: [birth year], 68 y.o.   MRN: [id number]  HPI: Lawrence Jensen is a 68 y.o. male  Chief Complaint  Patient presents with  . Diabetes  . Hypertension  . Hyperlipidemia   DIABETES Takes Metformin 1000 Mg two times a day.  Last A1C in August 6.7%.  He reports he normally eats a good diet, but occasionally eats sweets.  Has not been able to exercise as much due to humidity.  Was taking Gabapentin 100 MG TID, he quit taking it because his feet were not hurting him anymore.  Hypoglycemic episodes:no Polydipsia/polyuria: no Visual disturbance: no Chest pain: no Paresthesias: no Glucose Monitoring: yes  Accucheck frequency: Daily  Fasting glucose: 120-140 range  Post prandial:  Evening:  Before meals: Taking Insulin?: no  Long acting insulin:  Short acting insulin: Blood Pressure Monitoring: daily Retinal Examination: Up to Date Foot Exam: Up to Date Pneumovax: refused Influenza: refused Aspirin: yes   HYPERTENSION / HYPERLIPIDEMIA Continues on Lisinopril 10 MG and Amlodipine 5 MG + Crestor 40 MG daily.  He reports BP has increased a little with Meloxicam which was prescribed by ortho, [facility].  Is to also wear brace, reports he has bone on bone.   Satisfied with current treatment? yes Duration of hypertension: chronic BP monitoring frequency: daily BP range: 130-140/80's at home BP medication side effects: no Duration of hyperlipidemia: chronic Cholesterol medication side effects: no Cholesterol supplements: fish oil Medication compliance: good compliance Aspirin: yes Recent stressors: no Recurrent headaches: no Visual changes: no Palpitations: no Dyspnea: no Chest pain: no Lower extremity edema: no Dizzy/lightheaded: no   Relevant past medical, surgical, family and social  history reviewed and updated as indicated. Interim medical history since our last visit reviewed. Allergies and medications reviewed and updated.  Review of Systems  Constitutional: Negative for activity change, diaphoresis, fatigue and fever.  Respiratory: Negative for cough, chest tightness, shortness of breath and wheezing.   Cardiovascular: Negative for chest pain, palpitations and leg swelling.  Gastrointestinal: Negative for abdominal distention, abdominal pain, constipation, diarrhea, nausea and vomiting.  Endocrine: Negative for cold intolerance, heat intolerance, polydipsia, polyphagia and polyuria.  Neurological: Negative for dizziness, syncope, weakness, light-headedness, numbness and headaches.  Psychiatric/Behavioral: Negative.     Per HPI unless specifically indicated above     Objective:    BP 138/82 (BP Location: Left Arm, Patient Position: Sitting)   Pulse (!) 55   Temp 98 F (36.7 C) (Oral)   SpO2 98%   Wt Readings from Last 3 Encounters:  11/30/18 173 lb (78.5 kg)  11/28/18 167 lb (75.8 kg)  08/24/18 175 lb (79.4 kg)    Physical Exam Vitals signs and nursing note reviewed.  Constitutional:      General: He is awake. He is not in acute distress.    Appearance: He is well-developed. He is not ill-appearing.  HENT:     Head: Normocephalic and atraumatic.     Right Ear: Hearing normal. No drainage.     Left Ear: Hearing normal. No drainage.  Eyes:     General: Lids are normal.        Right eye: No discharge.        Left eye: No discharge.     Conjunctiva/sclera: Conjunctivae normal.  Pupils: Pupils are equal, round, and reactive to light.  Neck:     Musculoskeletal: Normal range of motion and neck supple.     Thyroid: No thyromegaly.     Vascular: No carotid bruit.  Cardiovascular:     Rate and Rhythm: Normal rate and regular rhythm.     Heart sounds: Normal heart sounds, S1 normal and S2 normal. No murmur. No gallop.   Pulmonary:     Effort:  Pulmonary effort is normal. No accessory muscle usage or respiratory distress.     Breath sounds: Normal breath sounds.  Abdominal:     General: Bowel sounds are normal.     Palpations: Abdomen is soft. There is no hepatomegaly or splenomegaly.  Musculoskeletal: Normal range of motion.     Right lower leg: No edema.     Left lower leg: No edema.  Skin:    General: Skin is warm and dry.     Capillary Refill: Capillary refill takes less than 2 seconds.     Findings: No rash.  Neurological:     Mental Status: He is alert and oriented to person, place, and time.     Deep Tendon Reflexes: Reflexes are normal and symmetric.  Psychiatric:        Mood and Affect: Mood normal.        Behavior: Behavior normal. Behavior is cooperative.        Thought Content: Thought content normal.        Judgment: Judgment normal.    Diabetic Foot Exam - Simple   Simple Foot Form Visual Inspection No deformities, no ulcerations, no other skin breakdown bilaterally: Yes Sensation Testing Intact to touch and monofilament testing bilaterally: Yes Pulse Check Posterior Tibialis and Dorsalis pulse intact bilaterally: Yes Comments     Results for orders placed or performed in visit on 12/14/18  Cologuard  Result Value Ref Range   Cologuard Negative Negative      Assessment & Plan:   Problem List Items Addressed This Visit      Cardiovascular and Mediastinum   Hypertension    Chronic, ongoing.  Increase Lisinopril 20 MG due to elevations in BP above goal and have advised using Meloxicam minimally, continue Amlodipine at current dose.  Monitor BP at home daily.  CMP today.  Return in 3 months for annual physical.      Relevant Medications   lisinopril (ZESTRIL) 20 MG tablet   amLODipine (NORVASC) 5 MG tablet   rosuvastatin (CRESTOR) 40 MG tablet     Endocrine   Type 2 diabetes mellitus with diabetic neuropathy, without long-term current use of insulin (HCC) - Primary    Chronic, ongoing.  A1C  6.7% today.  He reports no current use of Gabapentin.  Continue current medication regimen and adjust as needed.  Return in 3 months for annual physical.        Relevant Medications   lisinopril (ZESTRIL) 20 MG tablet   rosuvastatin (CRESTOR) 40 MG tablet   metFORMIN (GLUCOPHAGE) 500 MG tablet   Other Relevant Orders   Bayer DCA Hb A1c Waived   Hyperlipidemia associated with type 2 diabetes mellitus (HCC)    Chronic, ongoing.  Continue current medication regimen and adjust as needed.  Lipid panel and CMP today.      Relevant Medications   lisinopril (ZESTRIL) 20 MG tablet   rosuvastatin (CRESTOR) 40 MG tablet   metFORMIN (GLUCOPHAGE) 500 MG tablet   Other Relevant Orders   Comprehensive metabolic panel  Lipid Panel w/o Chol/HDL Ratio out       Follow up plan: Return in about 3 months (around 06/07/2019) for Annual physical + diabetic check.

## 2019-03-08 LAB — COMPREHENSIVE METABOLIC PANEL
ALT: 25 IU/L (ref 0–44)
AST: 21 IU/L (ref 0–40)
Albumin/Globulin Ratio: 1.8 (ref 1.2–2.2)
Albumin: 4.3 g/dL (ref 3.8–4.8)
Alkaline Phosphatase: 87 IU/L (ref 39–117)
BUN/Creatinine Ratio: 20 (ref 10–24)
BUN: 19 mg/dL (ref 8–27)
Bilirubin Total: 0.6 mg/dL (ref 0.0–1.2)
CO2: 24 mmol/L (ref 20–29)
Calcium: 9.8 mg/dL (ref 8.6–10.2)
Chloride: 103 mmol/L (ref 96–106)
Creatinine, Ser: 0.93 mg/dL (ref 0.76–1.27)
GFR calc Af Amer: 98 mL/min/{1.73_m2} (ref >59)
GFR calc non Af Amer: 85 mL/min/{1.73_m2} (ref >59)
Globulin, Total: 2.4 g/dL (ref 1.5–4.5)
Glucose: 143 mg/dL — ABNORMAL HIGH (ref 65–99)
Potassium: 4.4 mmol/L (ref 3.5–5.2)
Sodium: 142 mmol/L (ref 134–144)
Total Protein: 6.7 g/dL (ref 6.0–8.5)

## 2019-03-08 LAB — LIPID PANEL W/O CHOL/HDL RATIO
Cholesterol, Total: 94 mg/dL — ABNORMAL LOW (ref 100–199)
HDL: 41 mg/dL (ref >39)
LDL Chol Calc (NIH): 38 mg/dL (ref 0–99)
Triglycerides: 69 mg/dL (ref 0–149)
VLDL Cholesterol Cal: 15 mg/dL (ref 5–40)

## 2019-03-08 NOTE — Progress Notes (Signed)
Normal test results noted.  Please call patient and make them aware of normal results and will continue to monitor at regular visits.  Have a great day.  Look forward to seeing you at your next visit.

## 2019-06-09 ENCOUNTER — Encounter: Payer: Self-pay | Admitting: Nurse Practitioner

## 2019-06-09 ENCOUNTER — Ambulatory Visit (INDEPENDENT_AMBULATORY_CARE_PROVIDER_SITE_OTHER): Payer: Medicare Other | Admitting: Nurse Practitioner

## 2019-06-09 ENCOUNTER — Other Ambulatory Visit: Payer: Self-pay

## 2019-06-09 VITALS — BP 130/71 | HR 57 | Temp 98.6°F

## 2019-06-09 DIAGNOSIS — E1159 Type 2 diabetes mellitus with other circulatory complications: Secondary | ICD-10-CM | POA: Diagnosis not present

## 2019-06-09 DIAGNOSIS — E1169 Type 2 diabetes mellitus with other specified complication: Secondary | ICD-10-CM | POA: Diagnosis not present

## 2019-06-09 DIAGNOSIS — E538 Deficiency of other specified B group vitamins: Secondary | ICD-10-CM

## 2019-06-09 DIAGNOSIS — I10 Essential (primary) hypertension: Secondary | ICD-10-CM

## 2019-06-09 DIAGNOSIS — M25561 Pain in right knee: Secondary | ICD-10-CM

## 2019-06-09 DIAGNOSIS — I152 Hypertension secondary to endocrine disorders: Secondary | ICD-10-CM

## 2019-06-09 DIAGNOSIS — E785 Hyperlipidemia, unspecified: Secondary | ICD-10-CM

## 2019-06-09 DIAGNOSIS — E1140 Type 2 diabetes mellitus with diabetic neuropathy, unspecified: Secondary | ICD-10-CM | POA: Diagnosis not present

## 2019-06-09 LAB — MICROALBUMIN, URINE WAIVED
Creatinine, Urine Waived: 100 mg/dL (ref 10–300)
Microalb, Ur Waived: 30 mg/L — ABNORMAL HIGH (ref 0–19)
Microalb/Creat Ratio: <30 mg/g (ref <30)

## 2019-06-09 LAB — BAYER DCA HB A1C WAIVED: HB A1C (BAYER DCA - WAIVED): 7.1 % — ABNORMAL HIGH (ref <7.0)

## 2019-06-09 NOTE — Assessment & Plan Note (Signed)
Chronic, ongoing.  Continue current medication regimen and adjust as needed.  Lipid panel and CMP today.    

## 2019-06-09 NOTE — Progress Notes (Addendum)
BP 130/71   Pulse (!) 57   Temp 98.6 F (37 C) (Oral)   SpO2 98%    Subjective:    Patient ID: Lawrence Jensen, male    DOB: Aug 18, 1950, 69 y.o.   MRN: [id number]  HPI: Lawrence Jensen is a 69 y.o. male  Chief Complaint  Patient presents with  . Diabetes  . Hyperlipidemia  . Hypertension   DIABETES Takes Metformin 1000 Mg two times a day.  Last A1C in November 6.7%.  He reports he normally eats a good diet, but occasionally eats sweets.   Hypoglycemic episodes:no Polydipsia/polyuria: no Visual disturbance: no Chest pain: no Paresthesias: no Glucose Monitoring: yes  Accucheck frequency: Daily  Fasting glucose: 120-140 range  Post prandial:  Evening:  Before meals: Taking Insulin?: no  Long acting insulin:  Short acting insulin: Blood Pressure Monitoring: daily Retinal Examination: Not Up to Date Foot Exam: Up to Date Pneumovax: refused Influenza: refused Aspirin: yes   HYPERTENSION / HYPERLIPIDEMIA Continues on Lisinopril 10 MG and Amlodipine 5 MG + Crestor 40 MG daily.  Last visit his BP had increased a little with Meloxicam which was prescribed by ortho, [facility].  Is to also wear brace, reports he has bone on bone OA.  States he takes Meloxicam about 2-3 times a week and reports this helps a lot with his OA and helps his neuropathy, states he has stopped Gabapentin.   Satisfied with current treatment? yes Duration of hypertension: chronic BP monitoring frequency: daily BP range: 130-140/80's at home BP medication side effects: no Duration of hyperlipidemia: chronic Cholesterol medication side effects: no Cholesterol supplements: fish oil Medication compliance: good compliance Aspirin: yes Recent stressors: no Recurrent headaches: no Visual changes: no Palpitations: no Dyspnea: no Chest pain: no Lower extremity edema: no Dizzy/lightheaded: no   Relevant past medical, surgical, family and social history reviewed and updated as indicated.  Interim medical history since our last visit reviewed. Allergies and medications reviewed and updated.  Review of Systems  Constitutional: Negative for activity change, diaphoresis, fatigue and fever.  Respiratory: Negative for cough, chest tightness, shortness of breath and wheezing.   Cardiovascular: Negative for chest pain, palpitations and leg swelling.  Gastrointestinal: Negative for abdominal distention, abdominal pain, constipation, diarrhea, nausea and vomiting.  Endocrine: Negative for cold intolerance, heat intolerance, polydipsia, polyphagia and polyuria.  Neurological: Negative for dizziness, syncope, weakness, light-headedness, numbness and headaches.  Psychiatric/Behavioral: Negative.     Per HPI unless specifically indicated above     Objective:    BP 130/71   Pulse (!) 57   Temp 98.6 F (37 C) (Oral)   SpO2 98%   Wt Readings from Last 3 Encounters:  11/30/18 173 lb (78.5 kg)  11/28/18 167 lb (75.8 kg)  08/24/18 175 lb (79.4 kg)    Physical Exam Vitals and nursing note reviewed.  Constitutional:      General: He is awake. He is not in acute distress.    Appearance: He is well-developed. He is not ill-appearing.  HENT:     Head: Normocephalic and atraumatic.     Right Ear: Hearing normal. No drainage.     Left Ear: Hearing normal. No drainage.  Eyes:     General: Lids are normal.        Right eye: No discharge.        Left eye: No discharge.     Conjunctiva/sclera: Conjunctivae normal.     Pupils: Pupils are equal, round, and reactive to light.  Neck:     Thyroid: No thyromegaly.     Vascular: No carotid bruit.  Cardiovascular:     Rate and Rhythm: Normal rate and regular rhythm.     Heart sounds: Normal heart sounds, S1 normal and S2 normal. No murmur. No gallop.   Pulmonary:     Effort: Pulmonary effort is normal. No accessory muscle usage or respiratory distress.     Breath sounds: Normal breath sounds.  Abdominal:     General: Bowel sounds are  normal.     Palpations: Abdomen is soft.  Musculoskeletal:        General: Normal range of motion.     Cervical back: Normal range of motion and neck supple.     Right lower leg: No edema.     Left lower leg: No edema.  Skin:    General: Skin is warm and dry.     Capillary Refill: Capillary refill takes less than 2 seconds.  Neurological:     Mental Status: He is alert and oriented to person, place, and time.     Deep Tendon Reflexes: Reflexes are normal and symmetric.  Psychiatric:        Attention and Perception: Attention normal.        Mood and Affect: Mood normal.        Behavior: Behavior normal. Behavior is cooperative.        Thought Content: Thought content normal.    Diabetic Foot Exam - Simple   No data filed      Results for orders placed or performed in visit on 03/07/19  Bayer DCA Hb A1c Waived  Result Value Ref Range   HB A1C (BAYER DCA - WAIVED) 6.7 <7.0 %  Comprehensive metabolic panel  Result Value Ref Range   Glucose 143 (H) 65 - 99 mg/dL   BUN 19 8 - 27 mg/dL   Creatinine, Ser 5.03 0.76 - 1.27 mg/dL   GFR calc non Af Amer 85 >59 mL/min/1.73   GFR calc Af Amer 98 >59 mL/min/1.73   BUN/Creatinine Ratio 20 10 - 24   Sodium 142 134 - 144 mmol/L   Potassium 4.4 3.5 - 5.2 mmol/L   Chloride 103 96 - 106 mmol/L   CO2 24 20 - 29 mmol/L   Calcium 9.8 8.6 - 10.2 mg/dL   Total Protein 6.7 6.0 - 8.5 g/dL   Albumin 4.3 3.8 - 4.8 g/dL   Globulin, Total 2.4 1.5 - 4.5 g/dL   Albumin/Globulin Ratio 1.8 1.2 - 2.2   Bilirubin Total 0.6 0.0 - 1.2 mg/dL   Alkaline Phosphatase 87 39 - 117 IU/L   AST 21 0 - 40 IU/L   ALT 25 0 - 44 IU/L  Lipid Panel w/o Chol/HDL Ratio out  Result Value Ref Range   Cholesterol, Total 94 (L) 100 - 199 mg/dL   Triglycerides 69 0 - 149 mg/dL   HDL 41 >54 mg/dL   VLDL Cholesterol Cal 15 5 - 40 mg/dL   LDL Chol Calc (NIH) 38 0 - 99 mg/dL      Assessment & Plan:   Problem List Items Addressed This Visit      Cardiovascular and  Mediastinum   Hypertension associated with diabetes (HCC)    Chronic, ongoing with BP improved today. Continue Lisinopril 20 MG and Amlodipine 5 MG daily. Have advised using Meloxicam minimally and discussed risks.  Monitor BP at home daily.  CMP today.  Return in 3 months for annual physical.  Relevant Orders   Bayer DCA Hb A1c Waived   Microalbumin, Urine Waived     Endocrine   Type 2 diabetes mellitus with diabetic neuropathy, without long-term current use of insulin (HCC) - Primary    Chronic, ongoing.  A1C 7.1% today, slightly elevated from previous and endorses indulging over holidays.  Urine ALB 30 and A:C < 30, will continue Lisinopril for kidney protection.  Continue current medication regimen and adjust as needed.  Recommend continued focus on daily exercise, which he has started again, and healthy diet.  Return in 3 months for annual physical.        Relevant Orders   Bayer DCA Hb A1c Waived   Microalbumin, Urine Waived   Hyperlipidemia associated with type 2 diabetes mellitus (HCC)    Chronic, ongoing.  Continue current medication regimen and adjust as needed.  Lipid panel and CMP today.      Relevant Orders   Bayer DCA Hb A1c Waived   Comprehensive metabolic panel   Lipid Panel w/o Chol/HDL Ratio     Other   Anterior knee pain, right    Continue collaboration with ortho and recommend minimal use of Meloxicam, discussed at length risks.       Other Visit Diagnoses    B12 deficiency       History of low levels reported, last 609.  Recommend focus on daily supplement,  Recheck today.   Relevant Orders   Vitamin B12       Follow up plan: Return in about 3 months (around 09/06/2019) for Annual physical.

## 2019-06-09 NOTE — Assessment & Plan Note (Signed)
Continue collaboration with ortho and recommend minimal use of Meloxicam, discussed at length risks.

## 2019-06-09 NOTE — Assessment & Plan Note (Signed)
Chronic, ongoing with BP improved today. Continue Lisinopril 20 MG and Amlodipine 5 MG daily. Have advised using Meloxicam minimally and discussed risks.  Monitor BP at home daily.  CMP today.  Return in 3 months for annual physical.

## 2019-06-09 NOTE — Assessment & Plan Note (Addendum)
Chronic, ongoing.  A1C 7.1% today, slightly elevated from previous and endorses indulging over holidays.  Urine ALB 30 and A:C < 30, will continue Lisinopril for kidney protection.  Continue current medication regimen and adjust as needed.  Recommend continued focus on daily exercise, which he has started again, and healthy diet.  Return in 3 months for annual physical.

## 2019-06-09 NOTE — Patient Instructions (Addendum)
Vitamin B12 1000 MCG daily orally  Carbohydrate Counting for Diabetes Mellitus, Adult  Carbohydrate counting is a method of keeping track of how many carbohydrates you eat. Eating carbohydrates naturally increases the amount of sugar (glucose) in the blood. Counting how many carbohydrates you eat helps keep your blood glucose within normal limits, which helps you manage your diabetes (diabetes mellitus). It is important to know how many carbohydrates you can safely have in each meal. This is different for every person. A diet and nutrition specialist (registered dietitian) can help you make a meal plan and calculate how many carbohydrates you should have at each meal and snack. Carbohydrates are found in the following foods:  Grains, such as breads and cereals.  Dried beans and soy products.  Starchy vegetables, such as potatoes, peas, and corn.  Fruit and fruit juices.  Milk and yogurt.  Sweets and snack foods, such as cake, cookies, candy, chips, and soft drinks. How do I count carbohydrates? There are two ways to count carbohydrates in food. You can use either of the methods or a combination of both. Reading "Nutrition Facts" on packaged food The "Nutrition Facts" list is included on the labels of almost all packaged foods and beverages in the [place] It includes:  The serving size.  Information about nutrients in each serving, including the grams (g) of carbohydrate per serving. To use the "Nutrition Facts":  Decide how many servings you will have.  Multiply the number of servings by the number of carbohydrates per serving.  The resulting number is the total amount of carbohydrates that you will be having. Learning standard serving sizes of other foods When you eat carbohydrate foods that are not packaged or do not include "Nutrition Facts" on the label, you need to measure the servings in order to count the amount of carbohydrates:  Measure the foods that you will eat with a  food scale or measuring cup, if needed.  Decide how many standard-size servings you will eat.  Multiply the number of servings by 15. Most carbohydrate-rich foods have about 15 g of carbohydrates per serving. ? For example, if you eat 8 oz (170 g) of strawberries, you will have eaten 2 servings and 30 g of carbohydrates (2 servings x 15 g = 30 g).  For foods that have more than one food mixed, such as soups and casseroles, you must count the carbohydrates in each food that is included. The following list contains standard serving sizes of common carbohydrate-rich foods. Each of these servings has about 15 g of carbohydrates:   hamburger bun or  English muffin.   oz (15 mL) syrup.   oz (14 g) jelly.  1 slice of bread.  1 six-inch tortilla.  3 oz (85 g) cooked rice or pasta.  4 oz (113 g) cooked dried beans.  4 oz (113 g) starchy vegetable, such as peas, corn, or potatoes.  4 oz (113 g) hot cereal.  4 oz (113 g) mashed potatoes or  of a large baked potato.  4 oz (113 g) canned or frozen fruit.  4 oz (120 mL) fruit juice.  4-6 crackers.  6 chicken nuggets.  6 oz (170 g) unsweetened dry cereal.  6 oz (170 g) plain fat-free yogurt or yogurt sweetened with artificial sweeteners.  8 oz (240 mL) milk.  8 oz (170 g) fresh fruit or one small piece of fruit.  24 oz (680 g) popped popcorn. Example of carbohydrate counting Sample meal  3 oz (85 g)  chicken breast.  6 oz (170 g) brown rice.  4 oz (113 g) corn.  8 oz (240 mL) milk.  8 oz (170 g) strawberries with sugar-free whipped topping. Carbohydrate calculation 1. Identify the foods that contain carbohydrates: ? Rice. ? Corn. ? Milk. ? Strawberries. 2. Calculate how many servings you have of each food: ? 2 servings rice. ? 1 serving corn. ? 1 serving milk. ? 1 serving strawberries. 3. Multiply each number of servings by 15 g: ? 2 servings rice x 15 g = 30 g. ? 1 serving corn x 15 g = 15 g. ? 1 serving  milk x 15 g = 15 g. ? 1 serving strawberries x 15 g = 15 g. 4. Add together all of the amounts to find the total grams of carbohydrates eaten: ? 30 g + 15 g + 15 g + 15 g = 75 g of carbohydrates total. Summary  Carbohydrate counting is a method of keeping track of how many carbohydrates you eat.  Eating carbohydrates naturally increases the amount of sugar (glucose) in the blood.  Counting how many carbohydrates you eat helps keep your blood glucose within normal limits, which helps you manage your diabetes.  A diet and nutrition specialist (registered dietitian) can help you make a meal plan and calculate how many carbohydrates you should have at each meal and snack. This information is not intended to replace advice given to you by your health care provider. Make sure you discuss any questions you have with your health care provider. Document Revised: 10/22/2016 Document Reviewed: 09/11/2015 Elsevier Patient Education  [place].

## 2019-06-10 LAB — COMPREHENSIVE METABOLIC PANEL
ALT: 15 IU/L (ref 0–44)
AST: 17 IU/L (ref 0–40)
Albumin/Globulin Ratio: 1.8 (ref 1.2–2.2)
Albumin: 4.6 g/dL (ref 3.8–4.8)
Alkaline Phosphatase: 102 IU/L (ref 39–117)
BUN/Creatinine Ratio: 21 (ref 10–24)
BUN: 25 mg/dL (ref 8–27)
Bilirubin Total: 0.4 mg/dL (ref 0.0–1.2)
CO2: 24 mmol/L (ref 20–29)
Calcium: 10 mg/dL (ref 8.6–10.2)
Chloride: 102 mmol/L (ref 96–106)
Creatinine, Ser: 1.19 mg/dL (ref 0.76–1.27)
GFR calc Af Amer: 73 mL/min/{1.73_m2} (ref >59)
GFR calc non Af Amer: 63 mL/min/{1.73_m2} (ref >59)
Globulin, Total: 2.5 g/dL (ref 1.5–4.5)
Glucose: 136 mg/dL — ABNORMAL HIGH (ref 65–99)
Potassium: 4.7 mmol/L (ref 3.5–5.2)
Sodium: 141 mmol/L (ref 134–144)
Total Protein: 7.1 g/dL (ref 6.0–8.5)

## 2019-06-10 LAB — LIPID PANEL W/O CHOL/HDL RATIO
Cholesterol, Total: 190 mg/dL (ref 100–199)
HDL: 43 mg/dL (ref >39)
LDL Chol Calc (NIH): 128 mg/dL — ABNORMAL HIGH (ref 0–99)
Triglycerides: 104 mg/dL (ref 0–149)
VLDL Cholesterol Cal: 19 mg/dL (ref 5–40)

## 2019-06-10 LAB — VITAMIN B12: Vitamin B-12: 597 pg/mL (ref 232–1245)

## 2019-06-10 NOTE — Progress Notes (Signed)
Please let Mr. Markell know vis phone or letter: Overall kidney and liver function remain in good ranges.  B12 looks good.  Cholesterol levels were a little elevated compared to previous, I assume you may have ate prior to labs.  Continue your daily Crestor.  Have a great day!!

## 2019-06-15 ENCOUNTER — Other Ambulatory Visit: Payer: Self-pay | Admitting: Nurse Practitioner

## 2019-06-15 MED ORDER — MELOXICAM 15 MG PO TABS: 15.0000 mg | ORAL_TABLET | ORAL | 0 refills | Status: DC | PRN

## 2019-06-15 NOTE — Telephone Encounter (Signed)
Requested Prescriptions  Pending Prescriptions Disp Refills  . meloxicam (MOBIC) 15 MG tablet 30 tablet 0    Sig: Take 1 tablet (15 mg total) by mouth as needed for pain (take as needed once a day for knee pain).     Analgesics:  COX2 Inhibitors Failed - 06/15/2019  8:53 AM      Failed - HGB in normal range and within 360 days    Hemoglobin  Date Value Ref Range Status  11/09/2017 13.2 13.0 - 17.7 g/dL Final         Passed - Cr in normal range and within 360 days    Creatinine, Ser  Date Value Ref Range Status  06/09/2019 1.19 0.76 - 1.27 mg/dL Final         Passed - Patient is not pregnant      Passed - Valid encounter within last 12 months    Recent Outpatient Visits          6 days ago Type 2 diabetes mellitus with diabetic neuropathy, without long-term current use of insulin (HCC)   [facility] [place], Jolene T, NP   3 months ago Type 2 diabetes mellitus with diabetic neuropathy, without long-term current use of insulin (HCC)   [facility] [place], Jennings T, NP   4 months ago Anterior knee pain, right   [facility] Eugenio Saenz, [place] T, NP   6 months ago Diabetes mellitus without complication (HCC)   [facility] Cannady, Jolene T, NP   9 months ago Diabetes mellitus without complication (HCC)   [facility] Cannady, Dorie Rank, NP      Future Appointments            In 2 months Cannady, Dorie Rank, NP [employer] [facility]

## 2019-06-15 NOTE — Telephone Encounter (Signed)
Medication Refill - Medication: meloxicam (MOBIC) 15 MG tablet   Has the patient contacted their pharmacy? Yes.   (Agent: If no, request that the patient contact the pharmacy for the refill.) (Agent: If yes, when and what did the pharmacy advise?)  Preferred Pharmacy (with phone number or street name):  [facility] - [place], [place] - [address] Phone:  [phone]  Fax:  [phone]       Agent: Please be advised that RX refills may take up to 3 business days. We ask that you follow-up with your pharmacy.

## 2019-09-06 ENCOUNTER — Other Ambulatory Visit: Payer: Self-pay

## 2019-09-06 ENCOUNTER — Encounter: Payer: Self-pay | Admitting: Nurse Practitioner

## 2019-09-06 ENCOUNTER — Ambulatory Visit (INDEPENDENT_AMBULATORY_CARE_PROVIDER_SITE_OTHER): Payer: Medicare Other | Admitting: Nurse Practitioner

## 2019-09-06 VITALS — BP 122/69 | HR 56 | Temp 98.3°F | Ht 66.0 in | Wt 178.4 lb

## 2019-09-06 DIAGNOSIS — E785 Hyperlipidemia, unspecified: Secondary | ICD-10-CM | POA: Diagnosis not present

## 2019-09-06 DIAGNOSIS — K222 Esophageal obstruction: Secondary | ICD-10-CM

## 2019-09-06 DIAGNOSIS — E1169 Type 2 diabetes mellitus with other specified complication: Secondary | ICD-10-CM

## 2019-09-06 DIAGNOSIS — I152 Hypertension secondary to endocrine disorders: Secondary | ICD-10-CM

## 2019-09-06 DIAGNOSIS — E1159 Type 2 diabetes mellitus with other circulatory complications: Secondary | ICD-10-CM | POA: Diagnosis not present

## 2019-09-06 DIAGNOSIS — Z87891 Personal history of nicotine dependence: Secondary | ICD-10-CM

## 2019-09-06 DIAGNOSIS — K219 Gastro-esophageal reflux disease without esophagitis: Secondary | ICD-10-CM

## 2019-09-06 DIAGNOSIS — E1140 Type 2 diabetes mellitus with diabetic neuropathy, unspecified: Secondary | ICD-10-CM | POA: Diagnosis not present

## 2019-09-06 DIAGNOSIS — Z6828 Body mass index (BMI) 28.0-28.9, adult: Secondary | ICD-10-CM | POA: Insufficient documentation

## 2019-09-06 DIAGNOSIS — I10 Essential (primary) hypertension: Secondary | ICD-10-CM

## 2019-09-06 DIAGNOSIS — N5201 Erectile dysfunction due to arterial insufficiency: Secondary | ICD-10-CM

## 2019-09-06 DIAGNOSIS — Z0000 Encounter for general adult medical examination without abnormal findings: Secondary | ICD-10-CM | POA: Diagnosis not present

## 2019-09-06 LAB — BAYER DCA HB A1C WAIVED: HB A1C (BAYER DCA - WAIVED): 6.5 % (ref <7.0)

## 2019-09-06 NOTE — Assessment & Plan Note (Signed)
Chronic, ongoing.  A1C 6.5% today, decreased from previous.  Urine ALB 30 and A:C < 30 last visit, will continue Lisinopril for kidney protection.  Continue current medication regimen and adjust as needed.  Recommend continued focus on daily exercise, which he has started again, and healthy diet.  Return in 3 months for T2DM.  Refills due in November.

## 2019-09-06 NOTE — Assessment & Plan Note (Signed)
Recommended eating smaller high protein, low fat meals more frequently and exercising 30 mins a day 5 times a week with a goal of 10-15lb weight loss in the next 3 months. Patient voiced their understanding and motivation to adhere to these recommendations.  

## 2019-09-06 NOTE — Assessment & Plan Note (Signed)
Chronic, ongoing.  Continue current medication regimen and adjust as needed.  Lipid panel and CMP today.  Refills due in November.

## 2019-09-06 NOTE — Assessment & Plan Note (Signed)
Chronic, ongoing with BP at goal. Continue Lisinopril 20 MG and Amlodipine 5 MG daily. Have advised he continue using Meloxicam minimally and discussed risks.  Monitor BP at home daily.  CMP today.  Return in 3 months for 3 months for T2DM + HTN/HLD.  Refills due in November.

## 2019-09-06 NOTE — Assessment & Plan Note (Signed)
Stable without medication.  Continue good control T2DM and HTN.  Check PSA today.

## 2019-09-06 NOTE — Assessment & Plan Note (Signed)
Quit 23 years ago and has continued to not return to smoking.  Praised for continued cessation.

## 2019-09-06 NOTE — Assessment & Plan Note (Signed)
Chronic, stable without medication.  Uses Magnesium BID, check level today and adjust regimen as needed. 

## 2019-09-06 NOTE — Patient Instructions (Signed)
DASH Eating Plan DASH stands for "Dietary Approaches to Stop Hypertension." The DASH eating plan is a healthy eating plan that has been shown to reduce high blood pressure (hypertension). It may also reduce your risk for type 2 diabetes, heart disease, and stroke. The DASH eating plan may also help with weight loss. What are tips for following this plan?  General guidelines  Avoid eating more than 2,300 mg (milligrams) of salt (sodium) a day. If you have hypertension, you may need to reduce your sodium intake to 1,500 mg a day.  Limit alcohol intake to no more than 1 drink a day for nonpregnant women and 2 drinks a day for men. One drink equals 12 oz of beer, 5 oz of wine, or 1 oz of hard liquor.  Work with your health care provider to maintain a healthy body weight or to lose weight. Ask what an ideal weight is for you.  Get at least 30 minutes of exercise that causes your heart to beat faster (aerobic exercise) most days of the week. Activities may include walking, swimming, or biking.  Work with your health care provider or diet and nutrition specialist (dietitian) to adjust your eating plan to your individual calorie needs. Reading food labels   Check food labels for the amount of sodium per serving. Choose foods with less than 5 percent of the Daily Value of sodium. Generally, foods with less than 300 mg of sodium per serving fit into this eating plan.  To find whole grains, look for the word "whole" as the first word in the ingredient list. Shopping  Buy products labeled as "low-sodium" or "no salt added."  Buy fresh foods. Avoid canned foods and premade or frozen meals. Cooking  Avoid adding salt when cooking. Use salt-free seasonings or herbs instead of table salt or sea salt. Check with your health care provider or pharmacist before using salt substitutes.  Do not fry foods. Cook foods using healthy methods such as baking, boiling, grilling, and broiling instead.  Cook with  heart-healthy oils, such as olive, canola, soybean, or sunflower oil. Meal planning  Eat a balanced diet that includes: ? 5 or more servings of fruits and vegetables each day. At each meal, try to fill half of your plate with fruits and vegetables. ? Up to 6-8 servings of whole grains each day. ? Less than 6 oz of lean meat, poultry, or fish each day. A 3-oz serving of meat is about the same size as a deck of cards. One egg equals 1 oz. ? 2 servings of low-fat dairy each day. ? A serving of nuts, seeds, or beans 5 times each week. ? Heart-healthy fats. Healthy fats called Omega-3 fatty acids are found in foods such as flaxseeds and coldwater fish, like sardines, salmon, and mackerel.  Limit how much you eat of the following: ? Canned or prepackaged foods. ? Food that is high in trans fat, such as fried foods. ? Food that is high in saturated fat, such as fatty meat. ? Sweets, desserts, sugary drinks, and other foods with added sugar. ? Full-fat dairy products.  Do not salt foods before eating.  Try to eat at least 2 vegetarian meals each week.  Eat more home-cooked food and less restaurant, buffet, and fast food.  When eating at a restaurant, ask that your food be prepared with less salt or no salt, if possible. What foods are recommended? The items listed may not be a complete list. Talk with your dietitian about   what dietary choices are best for you. Grains Whole-grain or whole-wheat bread. Whole-grain or whole-wheat pasta. Brown rice. Oatmeal. Quinoa. Bulgur. Whole-grain and low-sodium cereals. Pita bread. Low-fat, low-sodium crackers. Whole-wheat flour tortillas. Vegetables Fresh or frozen vegetables (raw, steamed, roasted, or grilled). Low-sodium or reduced-sodium tomato and vegetable juice. Low-sodium or reduced-sodium tomato sauce and tomato paste. Low-sodium or reduced-sodium canned vegetables. Fruits All fresh, dried, or frozen fruit. Canned fruit in natural juice (without  added sugar). Meat and other protein foods Skinless chicken or turkey. Ground chicken or turkey. Pork with fat trimmed off. Fish and seafood. Egg whites. Dried beans, peas, or lentils. Unsalted nuts, nut butters, and seeds. Unsalted canned beans. Lean cuts of beef with fat trimmed off. Low-sodium, lean deli meat. Dairy Low-fat (1%) or fat-free (skim) milk. Fat-free, low-fat, or reduced-fat cheeses. Nonfat, low-sodium ricotta or cottage cheese. Low-fat or nonfat yogurt. Low-fat, low-sodium cheese. Fats and oils Soft margarine without trans fats. Vegetable oil. Low-fat, reduced-fat, or light mayonnaise and salad dressings (reduced-sodium). Canola, safflower, olive, soybean, and sunflower oils. Avocado. Seasoning and other foods Herbs. Spices. Seasoning mixes without salt. Unsalted popcorn and pretzels. Fat-free sweets. What foods are not recommended? The items listed may not be a complete list. Talk with your dietitian about what dietary choices are best for you. Grains Baked goods made with fat, such as croissants, muffins, or some breads. Dry pasta or rice meal packs. Vegetables Creamed or fried vegetables. Vegetables in a cheese sauce. Regular canned vegetables (not low-sodium or reduced-sodium). Regular canned tomato sauce and paste (not low-sodium or reduced-sodium). Regular tomato and vegetable juice (not low-sodium or reduced-sodium). Pickles. Olives. Fruits Canned fruit in a light or heavy syrup. Fried fruit. Fruit in cream or butter sauce. Meat and other protein foods Fatty cuts of meat. Ribs. Fried meat. Bacon. Sausage. Bologna and other processed lunch meats. Salami. Fatback. Hotdogs. Bratwurst. Salted nuts and seeds. Canned beans with added salt. Canned or smoked fish. Whole eggs or egg yolks. Chicken or turkey with skin. Dairy Whole or 2% milk, cream, and half-and-half. Whole or full-fat cream cheese. Whole-fat or sweetened yogurt. Full-fat cheese. Nondairy creamers. Whipped toppings.  Processed cheese and cheese spreads. Fats and oils Butter. Stick margarine. Lard. Shortening. Ghee. Bacon fat. Tropical oils, such as coconut, palm kernel, or palm oil. Seasoning and other foods Salted popcorn and pretzels. Onion salt, garlic salt, seasoned salt, table salt, and sea salt. Worcestershire sauce. Tartar sauce. Barbecue sauce. Teriyaki sauce. Soy sauce, including reduced-sodium. Steak sauce. Canned and packaged gravies. Fish sauce. Oyster sauce. Cocktail sauce. Horseradish that you find on the shelf. Ketchup. Mustard. Meat flavorings and tenderizers. Bouillon cubes. Hot sauce and Tabasco sauce. Premade or packaged marinades. Premade or packaged taco seasonings. Relishes. Regular salad dressings. Where to find more information:  National Heart, Lung, and Blood Institute: www.nhlbi.nih.gov  American Heart Association: www.heart.org Summary  The DASH eating plan is a healthy eating plan that has been shown to reduce high blood pressure (hypertension). It may also reduce your risk for type 2 diabetes, heart disease, and stroke.  With the DASH eating plan, you should limit salt (sodium) intake to 2,300 mg a day. If you have hypertension, you may need to reduce your sodium intake to 1,500 mg a day.  When on the DASH eating plan, aim to eat more fresh fruits and vegetables, whole grains, lean proteins, low-fat dairy, and heart-healthy fats.  Work with your health care provider or diet and nutrition specialist (dietitian) to adjust your eating plan to your   individual calorie needs. This information is not intended to replace advice given to you by your health care provider. Make sure you discuss any questions you have with your health care provider. Document Revised: 03/12/2017 Document Reviewed: 03/23/2016 Elsevier Patient Education  2020 [employer]  

## 2019-09-06 NOTE — Progress Notes (Addendum)
BP 122/69   Pulse (!) 56   Temp 98.3 F (36.8 C) (Oral)   Ht '5\' 6"'$  (1.676 m)   Wt 178 lb 6.4 oz (80.9 kg)   SpO2 96%   BMI 28.79 kg/m    Subjective:    Patient ID: Lawrence Jensen, male    DOB: [birth year], 69 y.o.   MRN: [id number]  HPI: Lawrence Jensen is a 69 y.o. male presenting on 09/06/2019 for comprehensive medical examination. Current medical complaints include:none  He currently lives with: wife Interim Problems from his last visit: no   DIABETES Takes Metformin 1000 MG two times a day. Last A1C in February 7.1%. He reports he normally eats a good diet, but occasionally eats sweets.  He reports he has been trying not to eat late at night and notices when he does sugars are more elevated.    Takes Magnesium 400 MG BID for GERD and occasional cramps. Hypoglycemic episodes:no Polydipsia/polyuria: no Visual disturbance: no Chest pain: no Paresthesias: no Glucose Monitoring: yes             Accucheck frequency: Daily             Fasting glucose: 155 this morning -- 125 to 155 range at home             Post prandial:             Evening:             Before meals: Taking Insulin?: no             Long acting insulin:             Short acting insulin: Blood Pressure Monitoring: daily Retinal Examination: Not Up to Date Foot Exam: Up to Date Pneumovax: refused Influenza: refused Aspirin: yes   HYPERTENSION / HYPERLIPIDEMIA Continues on Lisinopril 10 MG and Amlodipine 5 MG + Crestor 40 MG daily. Is taking Meloxicam on Monday, Wednesday, and Friday only -- does not take it more than this due to past discussion on kidneys and risks with consistent use. Satisfied with current treatment? yes Duration of hypertension: chronic BP monitoring frequency: daily BP range: 120-140/80's at home BP medication side effects: no Duration of hyperlipidemia: chronic Cholesterol medication side effects: no Cholesterol supplements: fish oil Medication compliance: good  compliance Aspirin: yes Recent stressors: no Recurrent headaches: no Visual changes: no Palpitations: no Dyspnea: no Chest pain: no Lower extremity edema: no Dizzy/lightheaded: no   Functional Status Survey: Is the patient deaf or have difficulty hearing?: No Does the patient have difficulty seeing, even when wearing glasses/contacts?: No Does the patient have difficulty concentrating, remembering, or making decisions?: No Does the patient have difficulty walking or climbing stairs?: Yes Does the patient have difficulty dressing or bathing?: No Does the patient have difficulty doing errands alone such as visiting a doctor's office or shopping?: No  FALL RISK: Fall Risk  09/06/2019 11/28/2018 11/17/2017 11/09/2017 11/02/2016  Falls in the past year? 0 0 No No No  Number falls in past yr: 0 - - - -  Injury with Fall? 0 - - - -  Follow up Falls evaluation completed - - - -    Depression Screen Depression screen [facility] 2/9 09/06/2019 11/28/2018 11/17/2017 11/09/2017 11/02/2016  Decreased Interest 0 0 0 0 0  Down, Depressed, Hopeless 0 0 0 0 0  PHQ - 2 Score 0 0 0 0 0  Altered sleeping - - 1 0 0  Tired, decreased energy - -  2 1 0  Change in appetite - - 0 0 0  Feeling bad or failure about yourself  - - 0 0 0  Trouble concentrating - - 0 0 0  Moving slowly or fidgety/restless - - 0 0 0  Suicidal thoughts - - 0 0 0  PHQ-9 Score - - 3 1 0    Advanced Directives <no information>  Past Medical History:  Past Medical History:  Diagnosis Date  . Diabetes mellitus without complication ([place])   . ED (erectile dysfunction)   . Hyperlipidemia   . Hypertension     Surgical History:  Past Surgical History:  Procedure Laterality Date  . APPENDECTOMY  1999    Medications:  Current Outpatient Medications on File Prior to Visit  Medication Sig  . amLODipine (NORVASC) 5 MG tablet Take 1 tablet (5 mg total) by mouth daily.  Marland Kitchen aspirin EC 81 MG tablet Take 81 mg by mouth daily.  . Blood  Glucose Calibration (OT ULTRA/FASTTK CNTRL SOLN) SOLN   . Blood Glucose Monitoring Suppl (ONE TOUCH ULTRA 2) w/Device KIT Inject 1 kit into the skin 3 (three) times daily.  . COD LIVER OIL PO Take 3 tablets by mouth daily.  Marland Kitchen glucose blood test strip Use as instructed  . lisinopril (ZESTRIL) 20 MG tablet Take 1 tablet (20 mg total) by mouth daily.  . Magnesium 400 MG TABS Take 400 mg by mouth 2 (two) times daily.  . meloxicam (MOBIC) 15 MG tablet Take 1 tablet (15 mg total) by mouth as needed for pain (take as needed once a day for knee pain).  . metFORMIN (GLUCOPHAGE) 500 MG tablet Take 2 tablets (1,000 mg total) by mouth 2 (two) times daily with a meal.  . rosuvastatin (CRESTOR) 40 MG tablet Take 1 tablet (40 mg total) by mouth daily.   No current facility-administered medications on file prior to visit.    Allergies:  No Known Allergies  Social History:  Social History   Socioeconomic History  . Marital status: Married    Spouse name: Not on file  . Number of children: Not on file  . Years of education: Not on file  . Highest education level: Associate degree: academic program  Occupational History  . Occupation: retired  Tobacco Use  . Smoking status: Former Smoker    Packs/day: 1.00    Years: 35.00    Pack years: 35.00    Types: Cigarettes    Quit date: 04/03/1995    Years since quitting: 24.4  . Smokeless tobacco: Never Used  Substance and Sexual Activity  . Alcohol use: No  . Drug use: No  . Sexual activity: Never  Other Topics Concern  . Not on file  Social History Narrative  . Not on file   Social Determinants of Health   Financial Resource Strain:   . Difficulty of Paying Living Expenses:   Food Insecurity:   . Worried About Charity fundraiser in the Last Year:   . Arboriculturist in the Last Year:   Transportation Needs:   . Film/video editor (Medical):   Marland Kitchen Lack of Transportation (Non-Medical):   Physical Activity: Sufficiently Active  . Days  of Exercise per Week: 7 days  . Minutes of Exercise per Session: 40 min  Stress:   . Feeling of Stress :   Social Connections:   . Frequency of Communication with Friends and Family:   . Frequency of Social Gatherings with Friends and Family:   .  Attends Religious Services:   . Active Member of Clubs or Organizations:   . Attends Archivist Meetings:   Marland Kitchen Marital Status:   Intimate Partner Violence:   . Fear of Current or Ex-Partner:   . Emotionally Abused:   Marland Kitchen Physically Abused:   . Sexually Abused:    Social History   Tobacco Use  Smoking Status Former Smoker  . Packs/day: 1.00  . Years: 35.00  . Pack years: 35.00  . Types: Cigarettes  . Quit date: 04/03/1995  . Years since quitting: 24.4  Smokeless Tobacco Never Used   Social History   Substance and Sexual Activity  Alcohol Use No    Family History:  Family History  Problem Relation Age of Onset  . Heart disease Mother   . Heart disease Father   . Congestive Heart Failure Father   . Cancer Father        prostate  . Diabetes Father   . Stroke Father   . Hypertension Father   . Cancer Brother   . Stroke Maternal Grandmother   . Heart disease Brother   . COPD Neg Hx     Past medical history, surgical history, medications, allergies, family history and social history reviewed with patient today and changes made to appropriate areas of the chart.   Review of Systems - negative All other ROS negative except what is listed above and in the HPI.      Objective:    BP 122/69   Pulse (!) 56   Temp 98.3 F (36.8 C) (Oral)   Ht 5' 6"  (1.676 m)   Wt 178 lb 6.4 oz (80.9 kg)   SpO2 96%   BMI 28.79 kg/m   Wt Readings from Last 3 Encounters:  09/06/19 178 lb 6.4 oz (80.9 kg)  11/30/18 173 lb (78.5 kg)  11/28/18 167 lb (75.8 kg)    Physical Exam Vitals and nursing note reviewed.  Constitutional:      General: He is awake. He is not in acute distress.    Appearance: He is well-developed,  well-groomed and overweight. He is not ill-appearing.  HENT:     Head: Normocephalic and atraumatic.     Right Ear: Hearing, tympanic membrane, ear canal and external ear normal. No drainage.     Left Ear: Hearing, tympanic membrane, ear canal and external ear normal. No drainage.     Nose: Nose normal.     Mouth/Throat:     Pharynx: Uvula midline.  Eyes:     General: Lids are normal.        Right eye: No discharge.        Left eye: No discharge.     Extraocular Movements: Extraocular movements intact.     Conjunctiva/sclera: Conjunctivae normal.     Pupils: Pupils are equal, round, and reactive to light.     Visual Fields: Right eye visual fields normal and left eye visual fields normal.  Neck:     Thyroid: No thyromegaly.     Vascular: No carotid bruit or JVD.     Trachea: Trachea normal.  Cardiovascular:     Rate and Rhythm: Normal rate and regular rhythm.     Heart sounds: Normal heart sounds, S1 normal and S2 normal. No murmur. No gallop.   Pulmonary:     Effort: Pulmonary effort is normal. No accessory muscle usage or respiratory distress.     Breath sounds: Normal breath sounds.  Abdominal:     General: Bowel sounds  are normal.     Palpations: Abdomen is soft. There is no hepatomegaly or splenomegaly.     Tenderness: There is no abdominal tenderness.  Musculoskeletal:        General: Normal range of motion.     Cervical back: Normal range of motion and neck supple.     Right lower leg: No edema.     Left lower leg: No edema.  Lymphadenopathy:     Head:     Right side of head: No submental, submandibular, tonsillar, preauricular or posterior auricular adenopathy.     Left side of head: No submental, submandibular, tonsillar, preauricular or posterior auricular adenopathy.     Cervical: No cervical adenopathy.  Skin:    General: Skin is warm and dry.     Capillary Refill: Capillary refill takes less than 2 seconds.     Findings: No rash.  Neurological:     Mental  Status: He is alert and oriented to person, place, and time.     Cranial Nerves: Cranial nerves are intact.     Gait: Gait is intact.     Deep Tendon Reflexes: Reflexes are normal and symmetric.     Reflex Scores:      Brachioradialis reflexes are 2+ on the right side and 2+ on the left side.      Patellar reflexes are 2+ on the right side and 2+ on the left side. Psychiatric:        Attention and Perception: Attention normal.        Mood and Affect: Mood normal.        Speech: Speech normal.        Behavior: Behavior normal. Behavior is cooperative.        Thought Content: Thought content normal.        Cognition and Memory: Cognition normal.        Judgment: Judgment normal.    6CIT Screen 09/06/2019 11/17/2017  What Year? 0 points 0 points  What month? 0 points 0 points  What time? 0 points 0 points  Count back from 20 0 points 0 points  Months in reverse 0 points 0 points  Repeat phrase 0 points 0 points  Total Score 0 0    Results for orders placed or performed in visit on 06/09/19  Bayer DCA Hb A1c Waived  Result Value Ref Range   HB A1C (BAYER DCA - WAIVED) 7.1 (H) <7.0 %  Microalbumin, Urine Waived  Result Value Ref Range   Microalb, Ur Waived 30 (H) 0 - 19 mg/L   Creatinine, Urine Waived 100 10 - 300 mg/dL   Microalb/Creat Ratio <30 <30 mg/g  Vitamin B12  Result Value Ref Range   Vitamin B-12 597 232 - 1,245 pg/mL  Comprehensive metabolic panel  Result Value Ref Range   Glucose 136 (H) 65 - 99 mg/dL   BUN 25 8 - 27 mg/dL   Creatinine, Ser 1.19 0.76 - 1.27 mg/dL   GFR calc non Af Amer 63 >59 mL/min/1.73   GFR calc Af Amer 73 >59 mL/min/1.73   BUN/Creatinine Ratio 21 10 - 24   Sodium 141 134 - 144 mmol/L   Potassium 4.7 3.5 - 5.2 mmol/L   Chloride 102 96 - 106 mmol/L   CO2 24 20 - 29 mmol/L   Calcium 10.0 8.6 - 10.2 mg/dL   Total Protein 7.1 6.0 - 8.5 g/dL   Albumin 4.6 3.8 - 4.8 g/dL   Globulin, Total 2.5 1.5 - 4.5  g/dL   Albumin/Globulin Ratio 1.8 1.2 -  2.2   Bilirubin Total 0.4 0.0 - 1.2 mg/dL   Alkaline Phosphatase 102 39 - 117 IU/L   AST 17 0 - 40 IU/L   ALT 15 0 - 44 IU/L  Lipid Panel w/o Chol/HDL Ratio  Result Value Ref Range   Cholesterol, Total 190 100 - 199 mg/dL   Triglycerides 104 0 - 149 mg/dL   HDL 43 >39 mg/dL   VLDL Cholesterol Cal 19 5 - 40 mg/dL   LDL Chol Calc (NIH) 128 (H) 0 - 99 mg/dL      Assessment & Plan:   Problem List Items Addressed This Visit      Cardiovascular and Mediastinum   Hypertension associated with diabetes ([place])    Chronic, ongoing with BP at goal. Continue Lisinopril 20 MG and Amlodipine 5 MG daily. Have advised he continue using Meloxicam minimally and discussed risks.  Monitor BP at home daily.  CMP today.  Return in 3 months for 3 months for T2DM + HTN/HLD.  Refills due in November.      Relevant Orders   TSH   Erectile dysfunction due to arterial insufficiency    Stable without medication.  Continue good control T2DM and HTN.  Check PSA today.      Relevant Orders   PSA     Digestive   GERD with stricture    Chronic, stable without medication.  Uses Magnesium BID, check level today and adjust regimen as needed.      Relevant Orders   Magnesium     Endocrine   Type 2 diabetes mellitus with diabetic neuropathy, without long-term current use of insulin ([facility])    Chronic, ongoing.  A1C 6.5% today, decreased from previous.  Urine ALB 30 and A:C < 30 last visit, will continue Lisinopril for kidney protection.  Continue current medication regimen and adjust as needed.  Recommend continued focus on daily exercise, which he has started again, and healthy diet.  Return in 3 months for T2DM.  Refills due in November.      Relevant Orders   Bayer DCA Hb A1c Waived   CBC with Differential/Platelet   Hyperlipidemia associated with type 2 diabetes mellitus ([facility])    Chronic, ongoing.  Continue current medication regimen and adjust as needed.  Lipid panel and CMP today.  Refills due in  November.      Relevant Orders   Comprehensive metabolic panel   Lipid Panel w/o Chol/HDL Ratio     Other   History of smoking    Quit 23 years ago and has continued to not return to smoking.  Praised for continued cessation.      BMI 28.0-28.9,adult    Recommended eating smaller high protein, low fat meals more frequently and exercising 30 mins a day 5 times a week with a goal of 10-15lb weight loss in the next 3 months. Patient voiced their understanding and motivation to adhere to these recommendations.        Other Visit Diagnoses    Encounter for annual physical exam    -  Primary       Discussed aspirin prophylaxis for myocardial infarction prevention and decision was made to continue ASA  LABORATORY TESTING:  Health maintenance labs ordered today as discussed above.   The natural history of prostate cancer and ongoing controversy regarding screening and potential treatment outcomes of prostate cancer has been discussed with the patient. The meaning of a false positive PSA  and a false negative PSA has been discussed. He indicates understanding of the limitations of this screening test and wishes to proceed with screening PSA testing.   IMMUNIZATIONS:  -- refuses flu and all PNA shots, does not believe in these - Tdap: Tetanus vaccination status reviewed: last tetanus booster within 10 years. - Influenza: REFUSES - Pneumovax: REFUSES - Prevnar: Refused - Zostavax vaccine: Refused  SCREENING: - Colonoscopy: Up to date  Discussed with patient purpose of the colonoscopy is to detect colon cancer at curable precancerous or early stages   - AAA Screening: Not applicable  -Hearing Test: Not applicable  -Spirometry: Not applicable   PATIENT COUNSELING:    Sexuality: Discussed sexually transmitted diseases, partner selection, use of condoms, avoidance of unintended pregnancy  and contraceptive alternatives.   Advised to avoid cigarette smoking.  I discussed with the  patient that most people either abstain from alcohol or drink within safe limits (<=14/week and <=4 drinks/occasion for males, <=7/weeks and <= 3 drinks/occasion for females) and that the risk for alcohol disorders and other health effects rises proportionally with the number of drinks per week and how often a drinker exceeds daily limits.  Discussed cessation/primary prevention of drug use and availability of treatment for abuse.   Diet: Encouraged to adjust caloric intake to maintain  or achieve ideal body weight, to reduce intake of dietary saturated fat and total fat, to limit sodium intake by avoiding high sodium foods and not adding table salt, and to maintain adequate dietary potassium and calcium preferably from fresh fruits, vegetables, and low-fat dairy products.    stressed the importance of regular exercise  Injury prevention: Discussed safety belts, safety helmets, smoke detector, smoking near bedding or upholstery.   Dental health: Discussed importance of regular tooth brushing, flossing, and dental visits.   Follow up plan: NEXT PREVENTATIVE PHYSICAL DUE IN 1 YEAR. Return in about 3 months (around 12/07/2019) for T2DM, HTN/HLD.

## 2019-09-07 LAB — CBC WITH DIFFERENTIAL/PLATELET
Basophils Absolute: 0 10*3/uL (ref 0.0–0.2)
Basos: 1 %
EOS (ABSOLUTE): 0.2 10*3/uL (ref 0.0–0.4)
Eos: 4 %
Hematocrit: 45.5 % (ref 37.5–51.0)
Hemoglobin: 15.7 g/dL (ref 13.0–17.7)
Immature Grans (Abs): 0 10*3/uL (ref 0.0–0.1)
Immature Granulocytes: 0 %
Lymphocytes Absolute: 1.2 10*3/uL (ref 0.7–3.1)
Lymphs: 26 %
MCH: 31.4 pg (ref 26.6–33.0)
MCHC: 34.5 g/dL (ref 31.5–35.7)
MCV: 91 fL (ref 79–97)
Monocytes Absolute: 0.4 10*3/uL (ref 0.1–0.9)
Monocytes: 8 %
Neutrophils Absolute: 2.9 10*3/uL (ref 1.4–7.0)
Neutrophils: 61 %
Platelets: 187 10*3/uL (ref 150–450)
RBC: 5 x10E6/uL (ref 4.14–5.80)
RDW: 12.5 % (ref 11.6–15.4)
WBC: 4.7 10*3/uL (ref 3.4–10.8)

## 2019-09-07 LAB — LIPID PANEL W/O CHOL/HDL RATIO
Cholesterol, Total: 91 mg/dL — ABNORMAL LOW (ref 100–199)
HDL: 39 mg/dL — ABNORMAL LOW (ref >39)
LDL Chol Calc (NIH): 32 mg/dL (ref 0–99)
Triglycerides: 110 mg/dL (ref 0–149)
VLDL Cholesterol Cal: 20 mg/dL (ref 5–40)

## 2019-09-07 LAB — COMPREHENSIVE METABOLIC PANEL
ALT: 16 IU/L (ref 0–44)
AST: 17 IU/L (ref 0–40)
Albumin/Globulin Ratio: 2.2 (ref 1.2–2.2)
Albumin: 4.8 g/dL (ref 3.8–4.8)
Alkaline Phosphatase: 87 IU/L (ref 48–121)
BUN/Creatinine Ratio: 18 (ref 10–24)
BUN: 21 mg/dL (ref 8–27)
Bilirubin Total: 0.8 mg/dL (ref 0.0–1.2)
CO2: 24 mmol/L (ref 20–29)
Calcium: 10.2 mg/dL (ref 8.6–10.2)
Chloride: 98 mmol/L (ref 96–106)
Creatinine, Ser: 1.2 mg/dL (ref 0.76–1.27)
GFR calc Af Amer: 71 mL/min/{1.73_m2} (ref >59)
GFR calc non Af Amer: 62 mL/min/{1.73_m2} (ref >59)
Globulin, Total: 2.2 g/dL (ref 1.5–4.5)
Glucose: 153 mg/dL — ABNORMAL HIGH (ref 65–99)
Potassium: 4.1 mmol/L (ref 3.5–5.2)
Sodium: 136 mmol/L (ref 134–144)
Total Protein: 7 g/dL (ref 6.0–8.5)

## 2019-09-07 LAB — TSH: TSH: 2.59 u[IU]/mL (ref 0.450–4.500)

## 2019-09-07 LAB — MAGNESIUM: Magnesium: 1.9 mg/dL (ref 1.6–2.3)

## 2019-09-07 LAB — PSA: Prostate Specific Ag, Serum: 0.3 ng/mL (ref 0.0–4.0)

## 2019-09-07 NOTE — Progress Notes (Signed)
Good morning.  Please let Lawrence Jensen know his labs have returned from his physical and overall everything looks great including his kidney, liver, prostate, and thyroid labs.  No changes to current medication regimen.  Keep up the good work!!  Have a great weekend!!

## 2019-09-11 ENCOUNTER — Other Ambulatory Visit: Payer: Self-pay | Admitting: Nurse Practitioner

## 2019-10-29 IMAGING — DX DG FINGER MIDDLE 2+V*R*
3 series · 3 of 3 positions shown · non-contrast
Comparison: None.

CLINICAL DATA: Laceration

EXAM:
RIGHT MIDDLE FINGER 2+V

[finger ap]
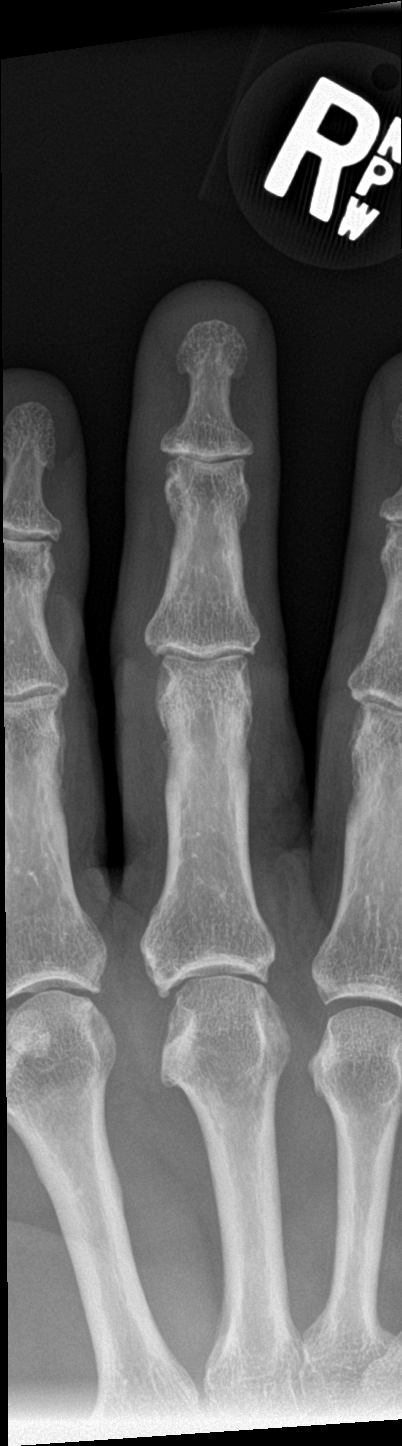

[finger obl]
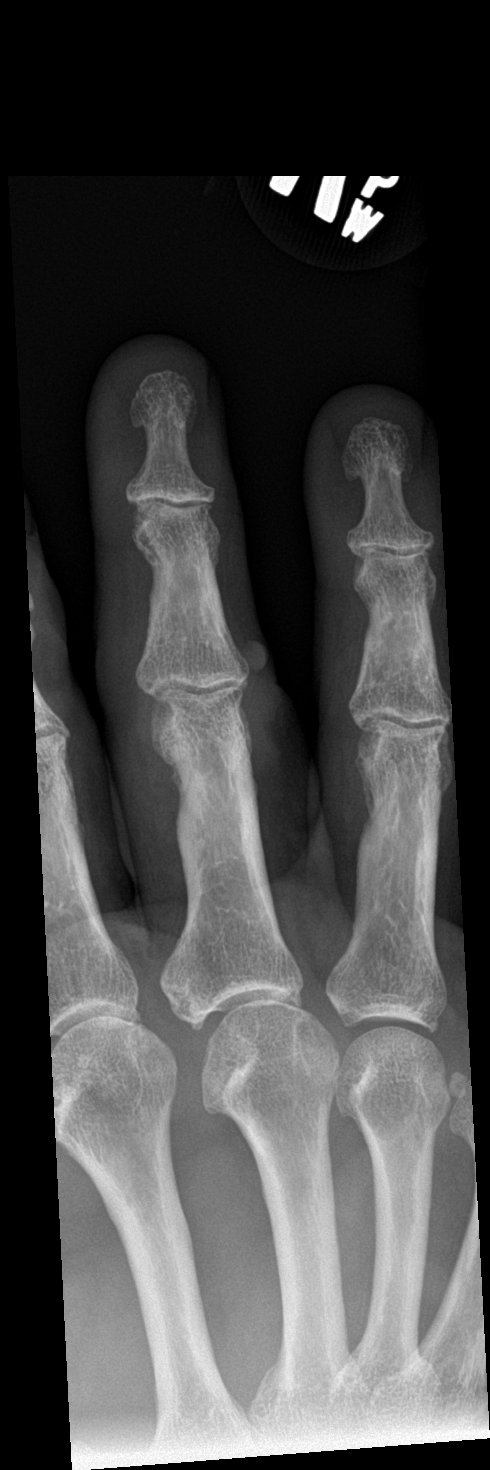

[finger lat]
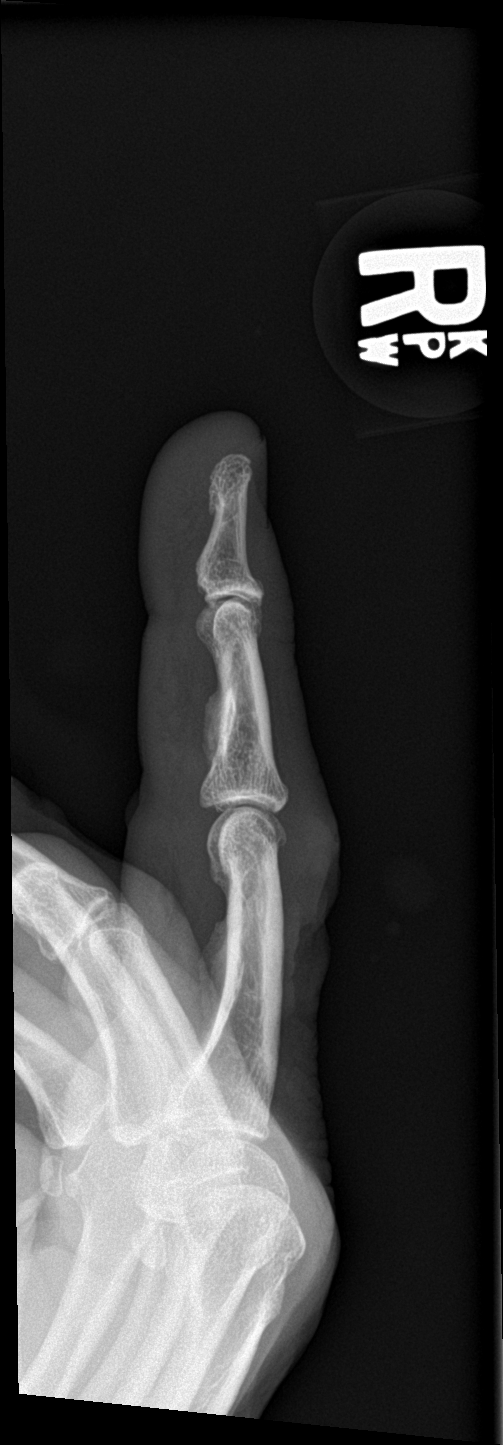

[3 of 3 positions shown; findings below may reference images not displayed]

FINDINGS: No fracture or malalignment. No radiopaque foreign body in the soft
tissues. Dorsal laceration superficial to the proximal phalanx.
IMPRESSION: No acute osseous abnormality

## 2019-12-04 ENCOUNTER — Ambulatory Visit (INDEPENDENT_AMBULATORY_CARE_PROVIDER_SITE_OTHER): Payer: Medicare Other

## 2019-12-04 VITALS — Ht 66.0 in | Wt 175.0 lb

## 2019-12-04 DIAGNOSIS — Z0000 Encounter for general adult medical examination without abnormal findings: Secondary | ICD-10-CM | POA: Diagnosis not present

## 2019-12-04 NOTE — Progress Notes (Signed)
I connected with Lawrence Jensen today by telephone and verified that I am speaking with the correct person using two identifiers. Location patient: home Location provider: work Persons participating in the virtual visit: Lawrence Jensen, Lawrence Durand LPN.   I discussed the limitations, risks, security and privacy concerns of performing an evaluation and management service by telephone and the availability of in person appointments. I also discussed with the patient that there may be a patient responsible charge related to this service. The patient expressed understanding and verbally consented to this telephonic visit.    Interactive audio and video telecommunications were attempted between this provider and patient, however failed, due to patient having technical difficulties OR patient did not have access to video capability.  We continued and completed visit with audio only.    Vital signs may be patient reported or missing.   Subjective:   Lawrence Jensen is a 69 y.o. male who presents for Medicare Annual/Subsequent preventive examination.  Review of Systems     Cardiac Risk Factors include: advanced age (>40mn, >>56women);diabetes mellitus;dyslipidemia;hypertension;male gender     Objective:    Today's Vitals   12/04/19 1028  Weight: 175 lb (79.4 kg)  Height: 5' 6"  (1.676 m)   Body mass index is 28.25 kg/m.  Advanced Directives 12/04/2019 11/28/2018 02/04/2018 11/17/2017 02/02/2017 11/02/2016  Does Patient Have a Medical Advance Directive? No Yes No No No No  Would patient like information on creating a medical advance directive? - - No - Patient declined Yes (MAU/Ambulatory/Procedural Areas - Information given) - No - Patient declined    Current Medications (verified) Outpatient Encounter Medications as of 12/04/2019  Medication Sig  . amLODipine (NORVASC) 5 MG tablet Take 1 tablet (5 mg total) by mouth daily.  .Marland Kitchenaspirin EC 81 MG tablet Take 81 mg by mouth daily.  .  Blood Glucose Calibration (OT ULTRA/FASTTK CNTRL SOLN) SOLN   . Blood Glucose Monitoring Suppl (ONE TOUCH ULTRA 2) w/Device KIT Inject 1 kit into the skin 3 (three) times daily.  . COD LIVER OIL PO Take 3 tablets by mouth daily.  .Marland Kitchenglucose blood test strip Use as instructed  . lisinopril (ZESTRIL) 20 MG tablet Take 1 tablet (20 mg total) by mouth daily.  . Magnesium 400 MG TABS Take 400 mg by mouth 2 (two) times daily.  . meloxicam (MOBIC) 15 MG tablet TAKE 1 TABLET BY MOUTH ONCE DAILY AS NEEDED FOR KNEE  PAIN  . metFORMIN (GLUCOPHAGE) 500 MG tablet Take 2 tablets (1,000 mg total) by mouth 2 (two) times daily with a meal.  . rosuvastatin (CRESTOR) 40 MG tablet Take 1 tablet (40 mg total) by mouth daily.   No facility-administered encounter medications on file as of 12/04/2019.    Allergies (verified) Patient has no known allergies.   History: Past Medical History:  Diagnosis Date  . [place]   . ED (erectile dysfunction)   . Hyperlipidemia   . Hypertension    Past Surgical History:  Procedure Laterality Date  . APPENDECTOMY  1999   Family History  Problem Relation Age of Onset  . Heart disease Mother   . Heart disease Father   . Congestive Heart Failure Father   . Cancer Father        prostate  . Diabetes Father   . Stroke Father   . Hypertension Father   . Cancer Brother   . Stroke Maternal Grandmother   . Heart disease Brother   . COPD  Neg Hx    Social History   Socioeconomic History  . Marital status: Married    Spouse name: Not on file  . Number of children: Not on file  . Years of education: Not on file  . Highest education level: Associate degree: academic program  Occupational History  . Occupation: retired  Tobacco Use  . Smoking status: Former Smoker    Packs/day: 1.00    Years: 35.00    Pack years: 35.00    Types: Cigarettes    Quit date: 04/03/1995    Years since quitting: 24.6  . Smokeless tobacco: Never Used    Vaping Use  . Vaping Use: Never used  Substance and Sexual Activity  . Alcohol use: No  . Drug use: No  . Sexual activity: Not Currently  Other Topics Concern  . Not on file  Social History Narrative  . Not on file   Social Determinants of Health   Financial Resource Strain: Low Risk   . Difficulty of Paying Living Expenses: Not hard at all  Food Insecurity: No Food Insecurity  . Worried About Charity fundraiser in the Last Year: Never true  . Ran Out of Food in the Last Year: Never true  Transportation Needs: No Transportation Needs  . Lack of Transportation (Medical): No  . Lack of Transportation (Non-Medical): No  Physical Activity: Insufficiently Active  . Days of Exercise per Week: 2 days  . Minutes of Exercise per Session: 30 min  Stress: No Stress Concern Present  . Feeling of Stress : Not at all  Social Connections:   . Frequency of Communication with Friends and Family: Not on file  . Frequency of Social Gatherings with Friends and Family: Not on file  . Attends Religious Services: Not on file  . Active Member of Clubs or Organizations: Not on file  . Attends Archivist Meetings: Not on file  . Marital Status: Not on file    Tobacco Counseling Counseling given: Not Answered   Clinical Intake:  Pre-visit preparation completed: Yes  Pain : No/denies pain     Nutritional Status: BMI 25 -29 Overweight Nutritional Risks: None Diabetes: Yes  How often do you need to have someone help you when you read instructions, pamphlets, or other written materials from your doctor or pharmacy?: 1 - Never What is the last grade level you completed in school?: some college  Diabetic? Yes Nutrition Risk Assessment:  Has the patient had any N/V/D within the last 2 months?  No  Does the patient have any non-healing wounds?  No  Has the patient had any unintentional weight loss or weight gain?  No   Diabetes:  Is the patient diabetic?  Yes  If diabetic,  was a CBG obtained today?  No  Did the patient bring in their glucometer from home?  No  How often do you monitor your CBG's? daily.   Financial Strains and Diabetes Management:  Are you having any financial strains with the device, your supplies or your medication? No .  Does the patient want to be seen by Chronic Care Management for management of their diabetes?  No  Would the patient like to be referred to a Nutritionist or for Diabetic Management?  No   Diabetic Exams:  Diabetic Eye Exam: Overdue for diabetic eye exam. Pt has been advised about the importance in completing this exam. Patient advised to call and schedule an eye exam. Diabetic Foot Exam: Completed 03/07/2019  Interpreter Needed?:  No  Information entered by :: NAllen LPN   Activities of Daily Living In your present state of health, do you have any difficulty performing the following activities: 12/04/2019 09/06/2019  Hearing? N N  Vision? N N  Difficulty concentrating or making decisions? N N  Walking or climbing stairs? N Y  Dressing or bathing? N N  Doing errands, shopping? N N  Preparing Food and eating ? N -  Using the Toilet? N -  In the past six months, have you accidently leaked urine? Y -  Do you have problems with loss of bowel control? N -  Managing your Medications? N -  Managing your Finances? N -  Housekeeping or managing your Housekeeping? N -  Some recent data might be hidden    Patient Care Team: Venita Lick, NP as PCP - General (Nurse Practitioner)  Indicate any recent Medical Services you may have received from other than [facility] providers in the past year (date may be approximate).     Assessment:   This is a routine wellness examination for Nole.  Hearing/Vision screen  Hearing Screening   125Hz  250Hz  500Hz  1000Hz  2000Hz  3000Hz  4000Hz  6000Hz  8000Hz   Right ear:           Left ear:           Vision Screening Comments: No regular eye exams, [place]  Dietary issues  and exercise activities discussed: Current Exercise Habits: Home exercise routine, Type of exercise: walking, Time (Minutes): 30, Frequency (Times/Week): 2, Weekly Exercise (Minutes/Week): 60  Goals    . DIET - INCREASE WATER INTAKE     Recommend drinking at least 6-8 glasses of water a day     . Patient Stated     12/04/2019, wants to stay active      Depression Screen PHQ 2/9 Scores 12/04/2019 09/06/2019 11/28/2018 11/17/2017 11/09/2017 11/02/2016  PHQ - 2 Score 0 0 0 0 0 0  PHQ- 9 Score - - - 3 1 0    Fall Risk Fall Risk  12/04/2019 09/06/2019 11/28/2018 11/17/2017 11/09/2017  Falls in the past year? 0 0 0 No No  Number falls in past yr: - 0 - - -  Injury with Fall? - 0 - - -  Risk for fall due to : Medication side effect - - - -  Follow up Falls evaluation completed;Education provided;Falls prevention discussed Falls evaluation completed - - -    Any stairs in or around the home? Yes  If so, are there any without handrails? No  Home free of loose throw rugs in walkways, pet beds, electrical cords, etc? Yes  Adequate lighting in your home to reduce risk of falls? Yes   ASSISTIVE DEVICES UTILIZED TO PREVENT FALLS:  Life alert? No  Use of a cane, walker or w/c? No  Grab bars in the bathroom? No  Shower chair or bench in shower? No  Elevated toilet seat or a handicapped toilet? No   TIMED UP AND GO:  Was the test performed? No   Cognitive Function:     6CIT Screen 12/04/2019 09/06/2019 11/17/2017  What Year? 0 points 0 points 0 points  What month? 0 points 0 points 0 points  What time? 0 points 0 points 0 points  Count back from 20 0 points 0 points 0 points  Months in reverse 0 points 0 points 0 points  Repeat phrase 0 points 0 points 0 points  Total Score 0 0 0  Immunizations Immunization History  Administered Date(s) Administered  . Tdap 01/02/2014    TDAP status: Up to date Flu Vaccine status: Declined, Education has been provided regarding the importance of this  vaccine but patient still declined. Advised may receive this vaccine at local pharmacy or Health Dept. Aware to provide a copy of the vaccination record if obtained from local pharmacy or Health Dept. Verbalized acceptance and understanding. Pneumococcal vaccine status: Declined,  Education has been provided regarding the importance of this vaccine but patient still declined. Advised may receive this vaccine at local pharmacy or Health Dept. Aware to provide a copy of the vaccination record if obtained from local pharmacy or Health Dept. Verbalized acceptance and understanding.  Covid-19 vaccine status: Declined, Education has been provided regarding the importance of this vaccine but patient still declined. Advised may receive this vaccine at local pharmacy or Health Dept.or vaccine clinic. Aware to provide a copy of the vaccination record if obtained from local pharmacy or Health Dept. Verbalized acceptance and understanding.  Qualifies for Shingles Vaccine? Yes   Zostavax completed No   Shingrix Completed?: No.    Education has been provided regarding the importance of this vaccine. Patient has been advised to call insurance company to determine out of pocket expense if they have not yet received this vaccine. Advised may also receive vaccine at local pharmacy or Health Dept. Verbalized acceptance and understanding.  Screening Tests Health Maintenance  Topic Date Due  . COVID-19 Vaccine (1) Never done  . PNA vac Low Risk Adult (1 of 2 - PCV13) Never done  . INFLUENZA VACCINE  11/12/2019  . OPHTHALMOLOGY EXAM  12/07/2019 (Originally 04/16/2019)  . FOOT EXAM  03/06/2020  . HEMOGLOBIN A1C  03/08/2020  . Fecal DNA (Cologuard)  12/05/2021  . TETANUS/TDAP  01/03/2024  . Hepatitis C Screening  Completed    Health Maintenance  Health Maintenance Due  Topic Date Due  . COVID-19 Vaccine (1) Never done  . PNA vac Low Risk Adult (1 of 2 - PCV13) Never done  . INFLUENZA VACCINE  11/12/2019     Colorectal cancer screening: Completed 12/06/2018. Repeat every 3 years  Lung Cancer Screening: (Low Dose CT Chest recommended if Age 64-80 years, 30 pack-year currently smoking OR have quit w/in 15years.) does not qualify.   Lung Cancer Screening Referral: no  Additional Screening:  Hepatitis C Screening: does qualify; Completed 08/24/2018  Vision Screening: Recommended annual ophthalmology exams for early detection of glaucoma and other disorders of the eye. Is the patient up to date with their annual eye exam?  No  Who is the provider or what is the name of the office in which the patient attends annual eye exams? [facility] If pt is not established with a provider, would they like to be referred to a provider to establish care? No .   Dental Screening: Recommended annual dental exams for proper oral hygiene  Community Resource Referral / Chronic Care Management: CRR required this visit?  No   CCM required this visit?  No      Plan:     I have personally reviewed and noted the following in the patient's chart:   . Medical and social history . Use of alcohol, tobacco or illicit drugs  . Current medications and supplements . Functional ability and status . Nutritional status . Physical activity . Advanced directives . List of other physicians . Hospitalizations, surgeries, and ER visits in previous 12 months . Vitals . Screenings to include  cognitive, depression, and falls . Referrals and appointments  In addition, I have reviewed and discussed with patient certain preventive protocols, quality metrics, and best practice recommendations. A written personalized care plan for preventive services as well as general preventive health recommendations were provided to patient.     Kellie Simmering, LPN   2/55/0016   Nurse Notes:

## 2019-12-04 NOTE — Patient Instructions (Signed)
[name] , Thank you for taking time to come for your Medicare Wellness Visit. I appreciate your ongoing commitment to your health goals. Please review the following plan we discussed and let me know if I can assist you in the future.   Screening recommendations/referrals: Colonoscopy: cologuard 12/06/2018 Recommended yearly ophthalmology/optometry visit for glaucoma screening and checkup Recommended yearly dental visit for hygiene and checkup  Vaccinations: Influenza vaccine: decline Pneumococcal vaccine: decline Tdap vaccine: completed 01/02/2014 Shingles vaccine: decline   Covid-19: decline  Advanced directives: Advance directive discussed with you today.    Conditions/risks identified: none  Next appointment: Follow up in one year for your annual wellness visit.   Preventive Care 68 Years and Older, Male Preventive care refers to lifestyle choices and visits with your health care provider that can promote health and wellness. What does preventive care include?  A yearly physical exam. This is also called an annual well check.  Dental exams once or twice a year.  Routine eye exams. Ask your health care provider how often you should have your eyes checked.  Personal lifestyle choices, including:  Daily care of your teeth and gums.  Regular physical activity.  Eating a healthy diet.  Avoiding tobacco and drug use.  Limiting alcohol use.  Practicing safe sex.  Taking low doses of aspirin every day.  Taking vitamin and mineral supplements as recommended by your health care provider. What happens during an annual well check? The services and screenings done by your health care provider during your annual well check will depend on your age, overall health, lifestyle risk factors, and family history of disease. Counseling  Your health care provider may ask you questions about your:  Alcohol use.  Tobacco use.  Drug use.  Emotional well-being.  Home and  relationship well-being.  Sexual activity.  Eating habits.  History of falls.  Memory and ability to understand (cognition).  Work and work Astronomer. Screening  You may have the following tests or measurements:  Height, weight, and BMI.  Blood pressure.  Lipid and cholesterol levels. These may be checked every 5 years, or more frequently if you are over 74 years old.  Skin check.  Lung cancer screening. You may have this screening every year starting at age 1 if you have a 30-pack-year history of smoking and currently smoke or have quit within the past 15 years.  Fecal occult blood test (FOBT) of the stool. You may have this test every year starting at age 57.  Flexible sigmoidoscopy or colonoscopy. You may have a sigmoidoscopy every 5 years or a colonoscopy every 10 years starting at age 81.  Prostate cancer screening. Recommendations will vary depending on your family history and other risks.  Hepatitis C blood test.  Hepatitis B blood test.  Sexually transmitted disease (STD) testing.  Diabetes screening. This is done by checking your blood sugar (glucose) after you have not eaten for a while (fasting). You may have this done every 1-3 years.  Abdominal aortic aneurysm (AAA) screening. You may need this if you are a current or former smoker.  Osteoporosis. You may be screened starting at age 73 if you are at high risk. Talk with your health care provider about your test results, treatment options, and if necessary, the need for more tests. Vaccines  Your health care provider may recommend certain vaccines, such as:  Influenza vaccine. This is recommended every year.  Tetanus, diphtheria, and acellular pertussis (Tdap, Td) vaccine. You may need a Td booster  every 10 years.  Zoster vaccine. You may need this after age 23.  Pneumococcal 13-valent conjugate (PCV13) vaccine. One dose is recommended after age 77.  Pneumococcal polysaccharide (PPSV23) vaccine. One  dose is recommended after age 85. Talk to your health care provider about which screenings and vaccines you need and how often you need them. This information is not intended to replace advice given to you by your health care provider. Make sure you discuss any questions you have with your health care provider. Document Released: 04/26/2015 Document Revised: 12/18/2015 Document Reviewed: 01/29/2015 Elsevier Interactive Patient Education  2017 [place] Prevention in the Home Falls can cause injuries. They can happen to people of all ages. There are many things you can do to make your home safe and to help prevent falls. What can I do on the outside of my home?  Regularly fix the edges of walkways and driveways and fix any cracks.  Remove anything that might make you trip as you walk through a door, such as a raised step or threshold.  Trim any bushes or trees on the path to your home.  Use bright outdoor lighting.  Clear any walking paths of anything that might make someone trip, such as rocks or tools.  Regularly check to see if handrails are loose or broken. Make sure that both sides of any steps have handrails.  Any raised decks and porches should have guardrails on the edges.  Have any leaves, snow, or ice cleared regularly.  Use sand or salt on walking paths during winter.  Clean up any spills in your garage right away. This includes oil or grease spills. What can I do in the bathroom?  Use night lights.  Install grab bars by the toilet and in the tub and shower. Do not use towel bars as grab bars.  Use non-skid mats or decals in the tub or shower.  If you need to sit down in the shower, use a plastic, non-slip stool.  Keep the floor dry. Clean up any water that spills on the floor as soon as it happens.  Remove soap buildup in the tub or shower regularly.  Attach bath mats securely with double-sided non-slip rug tape.  Do not have throw rugs and other  things on the floor that can make you trip. What can I do in the bedroom?  Use night lights.  Make sure that you have a light by your bed that is easy to reach.  Do not use any sheets or blankets that are too big for your bed. They should not hang down onto the floor.  Have a firm chair that has side arms. You can use this for support while you get dressed.  Do not have throw rugs and other things on the floor that can make you trip. What can I do in the kitchen?  Clean up any spills right away.  Avoid walking on wet floors.  Keep items that you use a lot in easy-to-reach places.  If you need to reach something above you, use a strong step stool that has a grab bar.  Keep electrical cords out of the way.  Do not use floor polish or wax that makes floors slippery. If you must use wax, use non-skid floor wax.  Do not have throw rugs and other things on the floor that can make you trip. What can I do with my stairs?  Do not leave any items on the stairs.  Make  sure that there are handrails on both sides of the stairs and use them. Fix handrails that are broken or loose. Make sure that handrails are as long as the stairways.  Check any carpeting to make sure that it is firmly attached to the stairs. Fix any carpet that is loose or worn.  Avoid having throw rugs at the top or bottom of the stairs. If you do have throw rugs, attach them to the floor with carpet tape.  Make sure that you have a light switch at the top of the stairs and the bottom of the stairs. If you do not have them, ask someone to add them for you. What else can I do to help prevent falls?  Wear shoes that:  Do not have high heels.  Have rubber bottoms.  Are comfortable and fit you well.  Are closed at the toe. Do not wear sandals.  If you use a stepladder:  Make sure that it is fully opened. Do not climb a closed stepladder.  Make sure that both sides of the stepladder are locked into place.  Ask  someone to hold it for you, if possible.  Clearly mark and make sure that you can see:  Any grab bars or handrails.  First and last steps.  Where the edge of each step is.  Use tools that help you move around (mobility aids) if they are needed. These include:  Canes.  Walkers.  Scooters.  Crutches.  Turn on the lights when you go into a dark area. Replace any light bulbs as soon as they burn out.  Set up your furniture so you have a clear path. Avoid moving your furniture around.  If any of your floors are uneven, fix them.  If there are any pets around you, be aware of where they are.  Review your medicines with your doctor. Some medicines can make you feel dizzy. This can increase your chance of falling. Ask your doctor what other things that you can do to help prevent falls. This information is not intended to replace advice given to you by your health care provider. Make sure you discuss any questions you have with your health care provider. Document Released: 01/24/2009 Document Revised: 09/05/2015 Document Reviewed: 05/04/2014 Elsevier Interactive Patient Education  2017 [employer].

## 2019-12-06 ENCOUNTER — Telehealth: Payer: Self-pay | Admitting: Nurse Practitioner

## 2019-12-06 NOTE — Telephone Encounter (Signed)
Medication Refill - Medication: Meloxicam  Has the patient contacted their pharmacy? Yes.   Patient states he has to contact PCP for authorization each quarter for RX to be sent pharmacy. (Agent: If no, request that the patient contact the pharmacy for the refill.) (Agent: If yes, when and what did the pharmacy advise?)  Preferred Pharmacy (with phone number or street name): [facility]  Agent: Please be advised that RX refills may take up to 3 business days. We ask that you follow-up with your pharmacy.

## 2019-12-26 ENCOUNTER — Other Ambulatory Visit: Payer: Self-pay

## 2019-12-26 ENCOUNTER — Encounter: Payer: Self-pay | Admitting: Nurse Practitioner

## 2019-12-26 ENCOUNTER — Ambulatory Visit (INDEPENDENT_AMBULATORY_CARE_PROVIDER_SITE_OTHER): Payer: Medicare Other | Admitting: Nurse Practitioner

## 2019-12-26 VITALS — BP 133/67 | HR 54 | Temp 97.9°F | Wt 181.0 lb

## 2019-12-26 DIAGNOSIS — I152 Hypertension secondary to endocrine disorders: Secondary | ICD-10-CM

## 2019-12-26 DIAGNOSIS — E1159 Type 2 diabetes mellitus with other circulatory complications: Secondary | ICD-10-CM | POA: Diagnosis not present

## 2019-12-26 DIAGNOSIS — E785 Hyperlipidemia, unspecified: Secondary | ICD-10-CM

## 2019-12-26 DIAGNOSIS — Z6828 Body mass index (BMI) 28.0-28.9, adult: Secondary | ICD-10-CM | POA: Diagnosis not present

## 2019-12-26 DIAGNOSIS — I10 Essential (primary) hypertension: Secondary | ICD-10-CM

## 2019-12-26 DIAGNOSIS — E1140 Type 2 diabetes mellitus with diabetic neuropathy, unspecified: Secondary | ICD-10-CM

## 2019-12-26 DIAGNOSIS — E1169 Type 2 diabetes mellitus with other specified complication: Secondary | ICD-10-CM

## 2019-12-26 LAB — BAYER DCA HB A1C WAIVED: HB A1C (BAYER DCA - WAIVED): 6.5 % (ref <7.0)

## 2019-12-26 MED ORDER — METFORMIN HCL 500 MG PO TABS
1000.0000 mg | ORAL_TABLET | Freq: Two times a day (BID) | ORAL | 4 refills | Status: DC
Start: 2019-12-26 — End: 2020-03-28

## 2019-12-26 MED ORDER — ROSUVASTATIN CALCIUM 40 MG PO TABS
40.0000 mg | ORAL_TABLET | Freq: Every day | ORAL | 4 refills | Status: DC
Start: 2019-12-26 — End: 2019-12-28

## 2019-12-26 MED ORDER — AMLODIPINE BESYLATE 5 MG PO TABS: 5.0000 mg | ORAL_TABLET | Freq: Every day | ORAL | 4 refills | Status: DC

## 2019-12-26 MED ORDER — LISINOPRIL 20 MG PO TABS
20.0000 mg | ORAL_TABLET | Freq: Every day | ORAL | 4 refills | Status: DC
Start: 2019-12-26 — End: 2020-03-28

## 2019-12-26 NOTE — Assessment & Plan Note (Signed)
Chronic, ongoing.  A1C 6.5% today, decreased from previous.  Urine ALB 30 and A:C < 30 last visit, will continue Lisinopril for kidney protection.  Continue current medication regimen and adjust as needed.  Recommend continued focus on daily exercise, which he has started again, and healthy diet.  Return in 3 months for T2DM.  Refills sent.

## 2019-12-26 NOTE — Assessment & Plan Note (Signed)
Chronic, ongoing.  Continue current medication regimen and adjust as needed.  Lipid panel next visit.  Refills sent.

## 2019-12-26 NOTE — Assessment & Plan Note (Signed)
Chronic, ongoing with BP at goal. Continue Lisinopril 20 MG and Amlodipine 5 MG daily. Have advised he continue using Meloxicam minimally and discussed risks.  Monitor BP at home daily.   Return in 3 months for 3 months for T2DM + HTN/HLD.  Refills sent.

## 2019-12-26 NOTE — Progress Notes (Signed)
BP 133/67   Pulse (!) 54   Temp 97.9 F (36.6 C) (Oral)   Wt 181 lb (82.1 kg)   SpO2 97%   BMI 29.21 kg/m    Subjective:    Patient ID: Lawrence Jensen, male    DOB: [birth year], 69 y.o.   MRN: [id number]  HPI: Lawrence Jensen is a 69 y.o. male  Chief Complaint  Patient presents with  . Diabetes  . Hypertension  . Hyperlipidemia   DIABETES Takes Metformin 1000 MG two times a day.  Last A1C 7.1%.  He reports he normally eats a good diet, but occasionally eats sweets.   Hypoglycemic episodes:no Polydipsia/polyuria: no Visual disturbance: no Chest pain: no Paresthesias: no Glucose Monitoring: yes  Accucheck frequency: Daily  Fasting glucose: 120-140 range -- 164 this morning  Post prandial:  Evening:  Before meals: Taking Insulin?: no  Long acting insulin:  Short acting insulin: Blood Pressure Monitoring: daily Retinal Examination: Not Up to Date Foot Exam: Up to Date Pneumovax: refused Influenza: refused Aspirin: yes   HYPERTENSION / HYPERLIPIDEMIA Continues on Lisinopril 10 MG and Amlodipine 5 MG + Crestor 40 MG daily.   Satisfied with current treatment? yes Duration of hypertension: chronic BP monitoring frequency: daily BP range: 130-140/80's at home BP medication side effects: no Duration of hyperlipidemia: chronic Cholesterol medication side effects: no Cholesterol supplements: fish oil Medication compliance: good compliance Aspirin: yes Recent stressors: no Recurrent headaches: no Visual changes: no Palpitations: no Dyspnea: no Chest pain: no Lower extremity edema: no Dizzy/lightheaded: no   Relevant past medical, surgical, family and social history reviewed and updated as indicated. Interim medical history since our last visit reviewed. Allergies and medications reviewed and updated.  Review of Systems  Constitutional: Negative for activity change, diaphoresis, fatigue and fever.  Respiratory: Negative for cough, chest tightness,  shortness of breath and wheezing.   Cardiovascular: Negative for chest pain, palpitations and leg swelling.  Gastrointestinal: Negative.   Endocrine: Negative for cold intolerance, heat intolerance, polydipsia, polyphagia and polyuria.  Neurological: Negative.   Psychiatric/Behavioral: Negative.     Per HPI unless specifically indicated above     Objective:    BP 133/67   Pulse (!) 54   Temp 97.9 F (36.6 C) (Oral)   Wt 181 lb (82.1 kg)   SpO2 97%   BMI 29.21 kg/m   Wt Readings from Last 3 Encounters:  12/26/19 181 lb (82.1 kg)  12/04/19 175 lb (79.4 kg)  09/06/19 178 lb 6.4 oz (80.9 kg)    Physical Exam Vitals and nursing note reviewed.  Constitutional:      General: He is awake. He is not in acute distress.    Appearance: He is well-developed. He is not ill-appearing.  HENT:     Head: Normocephalic and atraumatic.     Right Ear: Hearing normal. No drainage.     Left Ear: Hearing normal. No drainage.  Eyes:     General: Lids are normal.        Right eye: No discharge.        Left eye: No discharge.     Conjunctiva/sclera: Conjunctivae normal.     Pupils: Pupils are equal, round, and reactive to light.  Neck:     Thyroid: No thyromegaly.     Vascular: No carotid bruit.  Cardiovascular:     Rate and Rhythm: Normal rate and regular rhythm.     Heart sounds: Normal heart sounds, S1 normal and S2 normal. No murmur heard.  No gallop.   Pulmonary:     Effort: Pulmonary effort is normal. No accessory muscle usage or respiratory distress.     Breath sounds: Normal breath sounds.  Abdominal:     General: Bowel sounds are normal.     Palpations: Abdomen is soft.  Musculoskeletal:        General: Normal range of motion.     Cervical back: Normal range of motion and neck supple.     Right lower leg: No edema.     Left lower leg: No edema.  Skin:    General: Skin is warm and dry.     Capillary Refill: Capillary refill takes less than 2 seconds.  Neurological:      Mental Status: He is alert and oriented to person, place, and time.     Deep Tendon Reflexes: Reflexes are normal and symmetric.  Psychiatric:        Attention and Perception: Attention normal.        Mood and Affect: Mood normal.        Behavior: Behavior normal. Behavior is cooperative.        Thought Content: Thought content normal.    Results for orders placed or performed in visit on 09/06/19  Bayer DCA Hb A1c Waived  Result Value Ref Range   HB A1C (BAYER DCA - WAIVED) 6.5 <7.0 %  Comprehensive metabolic panel  Result Value Ref Range   Glucose 153 (H) 65 - 99 mg/dL   BUN 21 8 - 27 mg/dL   Creatinine, Ser 3.97 0.76 - 1.27 mg/dL   GFR calc non Af Amer 62 >59 mL/min/1.73   GFR calc Af Amer 71 >59 mL/min/1.73   BUN/Creatinine Ratio 18 10 - 24   Sodium 136 134 - 144 mmol/L   Potassium 4.1 3.5 - 5.2 mmol/L   Chloride 98 96 - 106 mmol/L   CO2 24 20 - 29 mmol/L   Calcium 10.2 8.6 - 10.2 mg/dL   Total Protein 7.0 6.0 - 8.5 g/dL   Albumin 4.8 3.8 - 4.8 g/dL   Globulin, Total 2.2 1.5 - 4.5 g/dL   Albumin/Globulin Ratio 2.2 1.2 - 2.2   Bilirubin Total 0.8 0.0 - 1.2 mg/dL   Alkaline Phosphatase 87 48 - 121 IU/L   AST 17 0 - 40 IU/L   ALT 16 0 - 44 IU/L  CBC with Differential/Platelet  Result Value Ref Range   WBC 4.7 3.4 - 10.8 x10E3/uL   RBC 5.00 4.14 - 5.80 x10E6/uL   Hemoglobin 15.7 13.0 - 17.7 g/dL   Hematocrit 67.3 41.9 - 51.0 %   MCV 91 79 - 97 fL   MCH 31.4 26.6 - 33.0 pg   MCHC 34.5 31 - 35 g/dL   RDW 37.9 02.4 - 09.7 %   Platelets 187 150 - 450 x10E3/uL   Neutrophils 61 Not Estab. %   Lymphs 26 Not Estab. %   Monocytes 8 Not Estab. %   Eos 4 Not Estab. %   Basos 1 Not Estab. %   Neutrophils Absolute 2.9 1 - 7 x10E3/uL   Lymphocytes Absolute 1.2 0 - 3 x10E3/uL   Monocytes Absolute 0.4 0 - 0 x10E3/uL   EOS (ABSOLUTE) 0.2 0.0 - 0.4 x10E3/uL   Basophils Absolute 0.0 0 - 0 x10E3/uL   Immature Granulocytes 0 Not Estab. %   Immature Grans (Abs) 0.0 0.0 - 0.1  x10E3/uL  TSH  Result Value Ref Range   TSH 2.590 0.450 - 4.500 uIU/mL  PSA  Result Value Ref Range   Prostate Specific Ag, Serum 0.3 0.0 - 4.0 ng/mL  Lipid Panel w/o Chol/HDL Ratio  Result Value Ref Range   Cholesterol, Total 91 (L) 100 - 199 mg/dL   Triglycerides 977 0 - 149 mg/dL   HDL 39 (L) >41 mg/dL   VLDL Cholesterol Cal 20 5 - 40 mg/dL   LDL Chol Calc (NIH) 32 0 - 99 mg/dL  Magnesium  Result Value Ref Range   Magnesium 1.9 1.6 - 2.3 mg/dL      Assessment & Plan:   Problem List Items Addressed This Visit      Cardiovascular and Mediastinum   Hypertension associated with diabetes (HCC)    Chronic, ongoing with BP at goal. Continue Lisinopril 20 MG and Amlodipine 5 MG daily. Have advised he continue using Meloxicam minimally and discussed risks.  Monitor BP at home daily.   Return in 3 months for 3 months for T2DM + HTN/HLD.  Refills sent.      Relevant Medications   metFORMIN (GLUCOPHAGE) 500 MG tablet   lisinopril (ZESTRIL) 20 MG tablet   rosuvastatin (CRESTOR) 40 MG tablet   amLODipine (NORVASC) 5 MG tablet     Endocrine   Type 2 diabetes mellitus with diabetic neuropathy, without long-term current use of insulin (HCC) - Primary    Chronic, ongoing.  A1C 6.5% today, decreased from previous.  Urine ALB 30 and A:C < 30 last visit, will continue Lisinopril for kidney protection.  Continue current medication regimen and adjust as needed.  Recommend continued focus on daily exercise, which he has started again, and healthy diet.  Return in 3 months for T2DM.  Refills sent.      Relevant Medications   metFORMIN (GLUCOPHAGE) 500 MG tablet   lisinopril (ZESTRIL) 20 MG tablet   rosuvastatin (CRESTOR) 40 MG tablet   Other Relevant Orders   Bayer DCA Hb A1c Waived   Hyperlipidemia associated with type 2 diabetes mellitus (HCC)    Chronic, ongoing.  Continue current medication regimen and adjust as needed.  Lipid panel next visit.  Refills sent.      Relevant Medications    metFORMIN (GLUCOPHAGE) 500 MG tablet   lisinopril (ZESTRIL) 20 MG tablet   rosuvastatin (CRESTOR) 40 MG tablet     Other   BMI 28.0-28.9,adult    Recommended eating smaller high protein, low fat meals more frequently and exercising 30 mins a day 5 times a week with a goal of 10-15lb weight loss in the next 3 months. Patient voiced their understanding and motivation to adhere to these recommendations.           Follow up plan: Return in about 3 months (around 03/26/2020) for T2DM, HTN/HLD.

## 2019-12-26 NOTE — Assessment & Plan Note (Signed)
Recommended eating smaller high protein, low fat meals more frequently and exercising 30 mins a day 5 times a week with a goal of 10-15lb weight loss in the next 3 months. Patient voiced their understanding and motivation to adhere to these recommendations.  

## 2019-12-26 NOTE — Patient Instructions (Signed)

## 2019-12-28 ENCOUNTER — Telehealth: Payer: Self-pay

## 2019-12-28 ENCOUNTER — Other Ambulatory Visit: Payer: Self-pay | Admitting: Nurse Practitioner

## 2019-12-28 MED ORDER — MELOXICAM 15 MG PO TABS
15.0000 mg | ORAL_TABLET | Freq: Every day | ORAL | 1 refills | Status: DC
Start: 2019-12-28 — End: 2020-02-23

## 2019-12-28 MED ORDER — ROSUVASTATIN CALCIUM 20 MG PO TABS: 20.0000 mg | ORAL_TABLET | Freq: Every day | ORAL | 3 refills | Status: DC

## 2019-12-28 NOTE — Telephone Encounter (Signed)
Patient came in in regards to Rosuvastatin RX. Patient states that we have sent in 40 mg tablets for him every time when he is only taking 20 mg daily. He states that he has been doing this for a while and would like this corrected. Routing to provider to advise. Patient uses [facility] for the pharmacy.

## 2019-12-28 NOTE — Telephone Encounter (Signed)
Corrected this and sent to [facility]

## 2019-12-28 NOTE — Telephone Encounter (Signed)
Routing to provider. Pharmacy added to chart.

## 2019-12-28 NOTE — Addendum Note (Signed)
Addended by: Pablo Ledger on: 12/28/2019 11:58 AM   Modules accepted: Orders

## 2019-12-28 NOTE — Telephone Encounter (Signed)
Called and notified patient that medication was sent in for him as requested.

## 2019-12-28 NOTE — Telephone Encounter (Signed)
Patient requesting a refill for Meloxicam 15 MG tablets. Patient states he takes them daily instead of Monday, Wednesday, Friday (states he informed provider during last visit) and took his last one this morning. Patient would like a refill called into [facility] so he does not have gp without his medication.  Please send medication to [facility].  Call patient if there are any issues with this request.

## 2019-12-29 NOTE — Telephone Encounter (Signed)
Patient notified

## 2020-02-18 ENCOUNTER — Other Ambulatory Visit: Payer: Self-pay | Admitting: Nurse Practitioner

## 2020-02-23 ENCOUNTER — Telehealth: Payer: Self-pay | Admitting: Nurse Practitioner

## 2020-02-23 ENCOUNTER — Other Ambulatory Visit: Payer: Self-pay | Admitting: Nurse Practitioner

## 2020-02-23 MED ORDER — MELOXICAM 15 MG PO TABS
15.0000 mg | ORAL_TABLET | Freq: Every day | ORAL | 0 refills | Status: DC
Start: 2020-02-23 — End: 2020-03-28

## 2020-02-23 NOTE — Telephone Encounter (Signed)
Patient is calling to request his melocamin and expressed with vulgarity his displeasure that he can not receive his medication. A refill request was placed. Patient is requesting a call back from the office manager. Please advise CB- [phone]

## 2020-02-23 NOTE — Telephone Encounter (Signed)
Medication: meloxicam (MOBIC) 15 MG tablet [[id number]], rosuvastatin (CRESTOR) 20 MG tablet [[id number]]  Has the patient contacted their pharmacy? YES  (Agent: If no, request that the patient contact the pharmacy for the refill.) (Agent: If yes, when and what did the pharmacy advise?)  Preferred Pharmacy (with phone number or street name): [facility] - [place], [place] - [address] [place], [address] [place], [address] [place] [address] Phone: [phone] Fax: [phone] Hours: Not open 24 hours    Agent: Please be advised that RX refills may take up to 3 business days. We ask that you follow-up with your pharmacy.

## 2020-03-01 NOTE — Telephone Encounter (Signed)
Returned patient's call as requested.

## 2020-03-27 ENCOUNTER — Telehealth: Payer: Self-pay

## 2020-03-27 NOTE — Telephone Encounter (Signed)
Pt came in on 03/26/2020 he was confused and thought his apt was that day when his apt was scheduled for 03/28/2020 Pt was upset used foul language in expressing his discontent pt was yelling and making other patients who were behind him feel uncomfortable.Tryed to come up with resolution for him to see other provider pt refused and stated he would come on correct date to apt. Pt stomped off after using vulgar language to front office staff . Practice admin was notified and PCP was made aware.

## 2020-03-28 ENCOUNTER — Ambulatory Visit (INDEPENDENT_AMBULATORY_CARE_PROVIDER_SITE_OTHER): Payer: Medicare Other | Admitting: Nurse Practitioner

## 2020-03-28 ENCOUNTER — Encounter: Payer: Self-pay | Admitting: Nurse Practitioner

## 2020-03-28 ENCOUNTER — Other Ambulatory Visit: Payer: Self-pay

## 2020-03-28 VITALS — BP 116/67 | HR 65 | Temp 98.8°F | Ht 65.87 in | Wt 178.6 lb

## 2020-03-28 DIAGNOSIS — I152 Hypertension secondary to endocrine disorders: Secondary | ICD-10-CM | POA: Diagnosis not present

## 2020-03-28 DIAGNOSIS — Z6828 Body mass index (BMI) 28.0-28.9, adult: Secondary | ICD-10-CM

## 2020-03-28 DIAGNOSIS — E1140 Type 2 diabetes mellitus with diabetic neuropathy, unspecified: Secondary | ICD-10-CM | POA: Diagnosis not present

## 2020-03-28 DIAGNOSIS — E785 Hyperlipidemia, unspecified: Secondary | ICD-10-CM

## 2020-03-28 DIAGNOSIS — E1159 Type 2 diabetes mellitus with other circulatory complications: Secondary | ICD-10-CM | POA: Diagnosis not present

## 2020-03-28 DIAGNOSIS — R051 Acute cough: Secondary | ICD-10-CM | POA: Insufficient documentation

## 2020-03-28 DIAGNOSIS — R059 Cough, unspecified: Secondary | ICD-10-CM

## 2020-03-28 DIAGNOSIS — E1169 Type 2 diabetes mellitus with other specified complication: Secondary | ICD-10-CM

## 2020-03-28 LAB — BAYER DCA HB A1C WAIVED: HB A1C (BAYER DCA - WAIVED): 6.6 % (ref <7.0)

## 2020-03-28 LAB — VERITOR FLU A/B WAIVED
Influenza A: NEGATIVE
Influenza B: NEGATIVE

## 2020-03-28 MED ORDER — METFORMIN HCL 500 MG PO TABS: 1000.0000 mg | ORAL_TABLET | Freq: Two times a day (BID) | ORAL | 4 refills | Status: DC

## 2020-03-28 MED ORDER — ROSUVASTATIN CALCIUM 20 MG PO TABS: 20.0000 mg | ORAL_TABLET | Freq: Every day | ORAL | 4 refills | Status: DC

## 2020-03-28 MED ORDER — AMLODIPINE BESYLATE 5 MG PO TABS: 5.0000 mg | ORAL_TABLET | Freq: Every day | ORAL | 4 refills | Status: DC

## 2020-03-28 MED ORDER — LISINOPRIL 20 MG PO TABS: 20.0000 mg | ORAL_TABLET | Freq: Every day | ORAL | 4 refills | Status: DC

## 2020-03-28 MED ORDER — MELOXICAM 15 MG PO TABS: 15.0000 mg | ORAL_TABLET | Freq: Every day | ORAL | 4 refills | Status: DC

## 2020-03-28 NOTE — Assessment & Plan Note (Signed)
Symptoms starting 03/25/20, 3 days ago, unvaccinated.  Will obtain flu and Covid testing today. Recommend he self quarantine at this time until results returned, if positive will need to continue quarantine for at least 10 days and ensure symptoms have resolved.  For treatment recommend OTC Coricidin or Diabetic Tussin + ensure good fluid intake and plenty of rest.  May take Tylenol as needed for myalgia.  If positive Covid will look into MAB infusion, discussed with him.  Return for worsening or ongoing symptoms.  If SOB or CP immediately got to ER.

## 2020-03-28 NOTE — Assessment & Plan Note (Signed)
Chronic, ongoing.  Continue current medication regimen and adjust as needed.  Lipid panel next visit.  Refills sent to [facility] per his request as had issues with mail order.

## 2020-03-28 NOTE — Assessment & Plan Note (Signed)
Chronic, ongoing with BP at goal. Continue Lisinopril 20 MG and Amlodipine 5 MG daily. Have advised he continue using Meloxicam minimally and discussed risks with consistent use -- will send 90 day supply.  Monitor BP at home daily.   Return in 3 months for T2DM + HTN/HLD.  Refills sent to [facility] per his request as had issues with mail order.

## 2020-03-28 NOTE — Progress Notes (Addendum)
BP 116/67   Pulse 65   Temp 98.8 F (37.1 C) (Oral)   Ht 5' 5.87" (1.673 m)   Wt 178 lb 9.6 oz (81 kg)   SpO2 96%   BMI 28.94 kg/m    Subjective:    Patient ID: Lawrence Jensen, male    DOB: May 06, 1950, 69 y.o.   MRN: [id number]  HPI: Lawrence Jensen is a 69 y.o. male  Chief Complaint  Patient presents with  . Diabetes  . Hypertension  . Hyperlipidemia  . Nasal Congestion    Stopped up since Monday  . Sore Throat  . Cough  . Muscle aches  . Chills   DIABETES Takes Metformin 1000 MG two times a day.  Last A1C 6.5%.  He reports he normally eats a good diet, but occasionally eats sweets.   Hypoglycemic episodes:no Polydipsia/polyuria: no Visual disturbance: no Chest pain: no Paresthesias: no Glucose Monitoring: yes  Accucheck frequency: Daily  Fasting glucose: 120-140 range -- 164 this morning  Post prandial:  Evening:  Before meals: Taking Insulin?: no  Long acting insulin:  Short acting insulin: Blood Pressure Monitoring: daily Retinal Examination: Not Up to Date Foot Exam: Up to Date Pneumovax: refused Influenza: refused Aspirin: yes   HYPERTENSION / HYPERLIPIDEMIA Continues on Lisinopril 10 MG and Amlodipine 5 MG + Crestor 40 MG daily.   Satisfied with current treatment? yes Duration of hypertension: chronic BP monitoring frequency: daily BP range: 130-140/80's at home BP medication side effects: no Duration of hyperlipidemia: chronic Cholesterol medication side effects: no Cholesterol supplements: fish oil Medication compliance: good compliance Aspirin: yes Recent stressors: no Recurrent headaches: no Visual changes: no Palpitations: no Dyspnea: no Chest pain: no Lower extremity edema: no Dizzy/lightheaded: no   UPPER RESPIRATORY TRACT INFECTION Started with symptoms on Monday with stuffy nose, throat was raw feeling.  Went ahead and went to work, started to feel better.  Tuesday morning started to feel worse.  Then Wednesday he  started to feel worse, with a mild non productive cough and achy muscles, temp was 98 at home.  No loss of taste or smell.  Not Covid vaccinated. Fever: no -- temp was 98 range at home Cough: yes Shortness of breath: no Wheezing: no Chest pain: none Chest tightness: yes Chest congestion: yes Nasal congestion: yes Runny nose: yes Post nasal drip: yes Sneezing: no Sore throat: yes Swollen glands: no Sinus pressure: no Headache: no Face pain: no Toothache: no Ear pain: none Ear pressure: none Eyes red/itching:no Eye drainage/crusting: no  Vomiting: no Rash: no Fatigue: yes Sick contacts: no Strep contacts: no  Context: worse Recurrent sinusitis: no Relief with OTC cold/cough medications: yes  Treatments attempted: none   Relevant past medical, surgical, family and social history reviewed and updated as indicated. Interim medical history since our last visit reviewed. Allergies and medications reviewed and updated.  Review of Systems  Constitutional: Positive for fatigue. Negative for activity change, chills, diaphoresis and fever.  HENT: Positive for congestion, postnasal drip, rhinorrhea and sore throat. Negative for ear discharge, ear pain, sinus pressure, sinus pain and sneezing.   Respiratory: Positive for cough. Negative for chest tightness, shortness of breath and wheezing.   Cardiovascular: Negative for chest pain, palpitations and leg swelling.  Gastrointestinal: Negative.   Endocrine: Negative for cold intolerance, heat intolerance, polydipsia, polyphagia and polyuria.  Musculoskeletal: Positive for myalgias.  Neurological: Negative.   Psychiatric/Behavioral: Negative.     Per HPI unless specifically indicated above     Objective:  BP 116/67   Pulse 65   Temp 98.8 F (37.1 C) (Oral)   Ht 5' 5.87" (1.673 m)   Wt 178 lb 9.6 oz (81 kg)   SpO2 96%   BMI 28.94 kg/m   Wt Readings from Last 3 Encounters:  03/28/20 178 lb 9.6 oz (81 kg)  12/26/19 181 lb  (82.1 kg)  12/04/19 175 lb (79.4 kg)    Physical Exam Vitals and nursing note reviewed.  Constitutional:      General: He is awake. He is not in acute distress.    Appearance: He is well-developed. He is not ill-appearing.  HENT:     Head: Normocephalic and atraumatic.     Right Ear: Hearing, ear canal and external ear normal. No drainage. A middle ear effusion is present.     Left Ear: Hearing, ear canal and external ear normal. No drainage. A middle ear effusion is present.     Nose: Nose normal.     Right Sinus: No maxillary sinus tenderness or frontal sinus tenderness.     Left Sinus: No maxillary sinus tenderness or frontal sinus tenderness.     Mouth/Throat:     Mouth: Mucous membranes are moist.     Pharynx: Oropharynx is clear.  Eyes:     General: Lids are normal.        Right eye: No discharge.        Left eye: No discharge.     Conjunctiva/sclera: Conjunctivae normal.     Pupils: Pupils are equal, round, and reactive to light.  Neck:     Thyroid: No thyromegaly.     Vascular: No carotid bruit.  Cardiovascular:     Rate and Rhythm: Normal rate and regular rhythm.     Heart sounds: Normal heart sounds, S1 normal and S2 normal. No murmur heard. No gallop.   Pulmonary:     Effort: Pulmonary effort is normal. No accessory muscle usage or respiratory distress.     Breath sounds: Normal breath sounds.  Abdominal:     General: Bowel sounds are normal.     Palpations: Abdomen is soft.  Musculoskeletal:        General: Normal range of motion.     Cervical back: Normal range of motion and neck supple.     Right lower leg: No edema.     Left lower leg: No edema.  Skin:    General: Skin is warm and dry.     Capillary Refill: Capillary refill takes less than 2 seconds.  Neurological:     Mental Status: He is alert and oriented to person, place, and time.     Deep Tendon Reflexes: Reflexes are normal and symmetric.  Psychiatric:        Attention and Perception: Attention  normal.        Mood and Affect: Mood normal.        Behavior: Behavior normal. Behavior is cooperative.        Thought Content: Thought content normal.    Diabetic Foot Exam - Simple   Simple Foot Form Visual Inspection No deformities, no ulcerations, no other skin breakdown bilaterally: Yes Sensation Testing Intact to touch and monofilament testing bilaterally: Yes Pulse Check Posterior Tibialis and Dorsalis pulse intact bilaterally: Yes Comments     Results for orders placed or performed in visit on 12/26/19  Bayer DCA Hb A1c Waived  Result Value Ref Range   HB A1C (BAYER DCA - WAIVED) 6.5 <7.0 %  Assessment & Plan:   Problem List Items Addressed This Visit      Cardiovascular and Mediastinum   Hypertension associated with diabetes (HCC)    Chronic, ongoing with BP at goal. Continue Lisinopril 20 MG and Amlodipine 5 MG daily. Have advised he continue using Meloxicam minimally and discussed risks with consistent use -- will send 90 day supply.  Monitor BP at home daily.   Return in 3 months for T2DM + HTN/HLD.  Refills sent to [facility] per his request as had issues with mail order.      Relevant Medications   metFORMIN (GLUCOPHAGE) 500 MG tablet   rosuvastatin (CRESTOR) 20 MG tablet   lisinopril (ZESTRIL) 20 MG tablet   amLODipine (NORVASC) 5 MG tablet   Other Relevant Orders   Basic metabolic panel   Bayer DCA Hb Q2W Waived     Endocrine   Type 2 diabetes mellitus with diabetic neuropathy, without long-term current use of insulin (HCC) - Primary    Chronic, ongoing.  A1C 6.6% today, stable.  Urine ALB 30 and A:C < 30 last visit, will continue Lisinopril for kidney protection.  Continue current medication regimen and adjust as needed.  Recommend continued focus on daily exercise, which he has started again, and healthy diet.  Return in 3 months for T2DM.  Refills sent to [facility] per his request as had issues with mail order.      Relevant Medications   metFORMIN  (GLUCOPHAGE) 500 MG tablet   rosuvastatin (CRESTOR) 20 MG tablet   lisinopril (ZESTRIL) 20 MG tablet   Other Relevant Orders   Bayer DCA Hb A1c Waived   Hyperlipidemia associated with type 2 diabetes mellitus (HCC)    Chronic, ongoing.  Continue current medication regimen and adjust as needed.  Lipid panel next visit.  Refills sent to [facility] per his request as had issues with mail order.      Relevant Medications   metFORMIN (GLUCOPHAGE) 500 MG tablet   rosuvastatin (CRESTOR) 20 MG tablet   lisinopril (ZESTRIL) 20 MG tablet   Other Relevant Orders   Bayer DCA Hb A1c Waived   Lipid Panel w/o Chol/HDL Ratio     Other   BMI 97.9-89.2,JJHER    Recommended eating smaller high protein, low fat meals more frequently and exercising 30 mins a day 5 times a week with a goal of 10-15lb weight loss in the next 3 months. Patient voiced their understanding and motivation to adhere to these recommendations.       Cough    Symptoms starting 03/25/20, 3 days ago, unvaccinated.  Will obtain flu and Covid testing today. Recommend he self quarantine at this time until results returned, if positive will need to continue quarantine for at least 10 days and ensure symptoms have resolved.  For treatment recommend OTC Coricidin or Diabetic Tussin + ensure good fluid intake and plenty of rest.  May take Tylenol as needed for myalgia.  If positive Covid will look into MAB infusion, discussed with him.  Return for worsening or ongoing symptoms.  If SOB or CP immediately got to ER.      Relevant Orders   Novel Coronavirus, NAA ([facility])   Veritor Flu A/B Waived       Follow up plan: Return in about 3 months (around 06/26/2020) for T2DM, HTN/HLD.

## 2020-03-28 NOTE — Assessment & Plan Note (Signed)
Recommended eating smaller high protein, low fat meals more frequently and exercising 30 mins a day 5 times a week with a goal of 10-15lb weight loss in the next 3 months. Patient voiced their understanding and motivation to adhere to these recommendations.  

## 2020-03-28 NOTE — Assessment & Plan Note (Signed)
Chronic, ongoing.  A1C 6.6% today, stable.  Urine ALB 30 and A:C < 30 last visit, will continue Lisinopril for kidney protection.  Continue current medication regimen and adjust as needed.  Recommend continued focus on daily exercise, which he has started again, and healthy diet.  Return in 3 months for T2DM.  Refills sent to [facility] per his request as had issues with mail order.

## 2020-03-28 NOTE — Patient Instructions (Signed)
Diabetic Tussin and Coricidin  Cough, Adult A cough helps to clear your throat and lungs. A cough may be a sign of an illness or another medical condition. An acute cough may only last 2-3 weeks, while a chronic cough may last 8 or more weeks. Many things can cause a cough. They include:  Germs (viruses or bacteria) that attack the airway.  Breathing in things that bother (irritate) your lungs.  Allergies.  Asthma.  Mucus that runs down the back of your throat (postnasal drip).  Smoking.  Acid backing up from the stomach into the tube that moves food from the mouth to the stomach (gastroesophageal reflux).  Some medicines.  Lung problems.  Other medical conditions, such as heart failure or a blood clot in the lung (pulmonary embolism). Follow these instructions at home: Medicines  Take over-the-counter and prescription medicines only as told by your doctor.  Talk with your doctor before you take medicines that stop a cough (coughsuppressants). Lifestyle   Do not smoke, and try not to be around smoke. Do not use any products that contain nicotine or tobacco, such as cigarettes, e-cigarettes, and chewing tobacco. If you need help quitting, ask your doctor.  Drink enough fluid to keep your pee (urine) pale yellow.  Avoid caffeine.  Do not drink alcohol if your doctor tells you not to drink. General instructions   Watch for any changes in your cough. Tell your doctor about them.  Always cover your mouth when you cough.  Stay away from things that make you cough, such as perfume, candles, campfire smoke, or cleaning products.  If the air is dry, use a cool mist vaporizer or humidifier in your home.  If your cough is worse at night, try using extra pillows to raise your head up higher while you sleep.  Rest as needed.  Keep all follow-up visits as told by your doctor. This is important. Contact a doctor if:  You have new symptoms.  You cough up pus.  Your  cough does not get better after 2-3 weeks, or your cough gets worse.  Cough medicine does not help your cough and you are not sleeping well.  You have pain that gets worse or pain that is not helped with medicine.  You have a fever.  You are losing weight and you do not know why.  You have night sweats. Get help right away if:  You cough up blood.  You have trouble breathing.  Your heartbeat is very fast. These symptoms may be an emergency. Do not wait to see if the symptoms will go away. Get medical help right away. Call your local emergency services (911 in the [place] Do not drive yourself to the hospital. Summary  A cough helps to clear your throat and lungs. Many things can cause a cough.  Take over-the-counter and prescription medicines only as told by your doctor.  Always cover your mouth when you cough.  Contact a doctor if you have new symptoms or you have a cough that does not get better or gets worse. This information is not intended to replace advice given to you by your health care provider. Make sure you discuss any questions you have with your health care provider. Document Revised: 04/18/2018 Document Reviewed: 04/18/2018 Elsevier Patient Education  2020 [employer].

## 2020-03-29 LAB — BASIC METABOLIC PANEL
BUN/Creatinine Ratio: 13 (ref 10–24)
BUN: 15 mg/dL (ref 8–27)
CO2: 24 mmol/L (ref 20–29)
Calcium: 9.4 mg/dL (ref 8.6–10.2)
Chloride: 98 mmol/L (ref 96–106)
Creatinine, Ser: 1.19 mg/dL (ref 0.76–1.27)
GFR calc Af Amer: 72 mL/min/{1.73_m2} (ref >59)
GFR calc non Af Amer: 62 mL/min/{1.73_m2} (ref >59)
Glucose: 144 mg/dL — ABNORMAL HIGH (ref 65–99)
Potassium: 4.3 mmol/L (ref 3.5–5.2)
Sodium: 139 mmol/L (ref 134–144)

## 2020-03-29 LAB — LIPID PANEL W/O CHOL/HDL RATIO
Cholesterol, Total: 98 mg/dL — ABNORMAL LOW (ref 100–199)
HDL: 34 mg/dL — ABNORMAL LOW (ref >39)
LDL Chol Calc (NIH): 48 mg/dL (ref 0–99)
Triglycerides: 77 mg/dL (ref 0–149)
VLDL Cholesterol Cal: 16 mg/dL (ref 5–40)

## 2020-03-29 NOTE — Progress Notes (Signed)
Good afternoon, please let Mr. Cokley know his labs continue to be stable.  No medication changes needed.  Normal kidney function and cholesterol levels at goal.  Waiting on Covid testing to return.  Have a great day!! Keep being awesome!!  Thank you for allowing me to participate in your care. Kindest regards, Girtha Kilgore

## 2020-03-30 LAB — SARS-COV-2, NAA 2 DAY TAT

## 2020-03-30 LAB — NOVEL CORONAVIRUS, NAA: SARS-CoV-2, NAA: DETECTED — AB

## 2020-03-31 ENCOUNTER — Telehealth: Payer: Self-pay | Admitting: Nurse Practitioner

## 2020-03-31 ENCOUNTER — Other Ambulatory Visit: Payer: Self-pay | Admitting: Physician Assistant

## 2020-03-31 DIAGNOSIS — E1140 Type 2 diabetes mellitus with diabetic neuropathy, unspecified: Secondary | ICD-10-CM

## 2020-03-31 DIAGNOSIS — I152 Hypertension secondary to endocrine disorders: Secondary | ICD-10-CM

## 2020-03-31 DIAGNOSIS — U071 COVID-19: Secondary | ICD-10-CM

## 2020-03-31 NOTE — Progress Notes (Signed)
I connected by phone with Lawrence Jensen on 03/31/2020 at 8:16 AM to discuss the potential use of a new treatment for mild to moderate COVID-19 viral infection in non-hospitalized patients.  This patient is a 69 y.o. male that meets the FDA criteria for Emergency Use Authorization of COVID monoclonal antibody casirivimab/imdevimab, bamlanivimab/etesevimab, or sotrovimab.  Has a (+) direct SARS-CoV-2 viral test result  Has mild or moderate COVID-19   Is NOT hospitalized due to COVID-19  Is within 10 days of symptom onset  Has at least one of the high risk factor(s) for progression to severe COVID-19 and/or hospitalization as defined in EUA.  Specific high risk criteria : BMI > 25, Diabetes and Cardiovascular disease or hypertension   I have spoken and communicated the following to the patient or parent/caregiver regarding COVID monoclonal antibody treatment:  1. FDA has authorized the emergency use for the treatment of mild to moderate COVID-19 in adults and pediatric patients with positive results of direct SARS-CoV-2 viral testing who are 61 years of age and older weighing at least 40 kg, and who are at high risk for progressing to severe COVID-19 and/or hospitalization.  2. The significant known and potential risks and benefits of COVID monoclonal antibody, and the extent to which such potential risks and benefits are unknown.  3. Information on available alternative treatments and the risks and benefits of those alternatives, including clinical trials.  4. Patients treated with COVID monoclonal antibody should continue to self-isolate and use infection control measures (e.g., wear mask, isolate, social distance, avoid sharing personal items, clean and disinfect "high touch" surfaces, and frequent handwashing) according to CDC guidelines.   5. The patient or parent/caregiver has the option to accept or refuse COVID monoclonal antibody treatment.  After reviewing this information  with the patient, the patient has agreed to receive one of the available covid 19 monoclonal antibodies and will be provided an appropriate fact sheet prior to infusion. Roe Rutherford Quamel Fitzmaurice, [place] 03/31/2020 8:16 AM

## 2020-03-31 NOTE — Telephone Encounter (Signed)
Patient made aware of positive Covid test results on 03/30/20 at 0830 and reached out to MAB infusion team to add him to call list.  Discussed at length MAB treatment with him and he is aware someone will call to discuss whether he qualifies and if he can be scheduled.  At this time he denies SOB or CP.  Have discussed with him need to self quarantine for 10 days, monitor O2 sats at home and temperature.  Ensure plenty of rest and fluid.  May take OTC medications for symptoms relief.  If any worsening symptoms such as SOB, CP, increased fever, then he is to immediately go to ER.

## 2020-04-01 ENCOUNTER — Ambulatory Visit (HOSPITAL_COMMUNITY)
Admission: RE | Admit: 2020-04-01 | Discharge: 2020-04-01 | Disposition: A | Discharge status: Home / Self Care | Payer: Medicare Other | Source: Ambulatory Visit | Attending: Pulmonary Disease | Admitting: Pulmonary Disease

## 2020-04-01 DIAGNOSIS — Z23 Encounter for immunization: Secondary | ICD-10-CM | POA: Diagnosis not present

## 2020-04-01 DIAGNOSIS — E1159 Type 2 diabetes mellitus with other circulatory complications: Secondary | ICD-10-CM | POA: Diagnosis present

## 2020-04-01 DIAGNOSIS — E1140 Type 2 diabetes mellitus with diabetic neuropathy, unspecified: Secondary | ICD-10-CM | POA: Diagnosis present

## 2020-04-01 DIAGNOSIS — I152 Hypertension secondary to endocrine disorders: Secondary | ICD-10-CM

## 2020-04-01 DIAGNOSIS — U071 COVID-19: Secondary | ICD-10-CM | POA: Diagnosis present

## 2020-04-01 MED ORDER — FAMOTIDINE IN NACL 20-0.9 MG/50ML-% IV SOLN: 20.0000 mg | Freq: Once | INTRAVENOUS | Status: DC | PRN

## 2020-04-01 MED ORDER — METHYLPREDNISOLONE SODIUM SUCC 125 MG IJ SOLR: 125.0000 mg | Freq: Once | INTRAMUSCULAR | Status: DC | PRN

## 2020-04-01 MED ORDER — ALBUTEROL SULFATE HFA 108 (90 BASE) MCG/ACT IN AERS: 2.0000 | INHALATION_SPRAY | Freq: Once | RESPIRATORY_TRACT | Status: DC | PRN

## 2020-04-01 MED ORDER — DIPHENHYDRAMINE HCL 50 MG/ML IJ SOLN: 50.0000 mg | Freq: Once | INTRAMUSCULAR | Status: DC | PRN

## 2020-04-01 MED ORDER — SODIUM CHLORIDE 0.9 % IV SOLN: Freq: Once | INTRAVENOUS | Status: AC

## 2020-04-01 MED ORDER — EPINEPHRINE 0.3 MG/0.3ML IJ SOAJ: 0.3000 mg | Freq: Once | INTRAMUSCULAR | Status: DC | PRN

## 2020-04-01 MED ORDER — SODIUM CHLORIDE 0.9 % IV SOLN: INTRAVENOUS | Status: DC | PRN

## 2020-04-01 NOTE — Discharge Instructions (Signed)
10 Things You Can Do to Manage Your COVID-19 Symptoms at Home If you have possible or confirmed COVID-19: 1. Stay home from work and school. And stay away from other public places. If you must go out, avoid using any kind of public transportation, ridesharing, or taxis. 2. Monitor your symptoms carefully. If your symptoms get worse, call your healthcare provider immediately. 3. Get rest and stay hydrated. 4. If you have a medical appointment, call the healthcare provider ahead of time and tell them that you have or may have COVID-19. 5. For medical emergencies, call 911 and notify the dispatch personnel that you have or may have COVID-19. 6. Cover your cough and sneezes with a tissue or use the inside of your elbow. 7. Wash your hands often with soap and water for at least 20 seconds or clean your hands with an alcohol-based hand sanitizer that contains at least 60% alcohol. 8. As much as possible, stay in a specific room and away from other people in your home. Also, you should use a separate bathroom, if available. If you need to be around other people in or outside of the home, wear a mask. 9. Avoid sharing personal items with other people in your household, like dishes, towels, and bedding. 10. Clean all surfaces that are touched often, like counters, tabletops, and doorknobs. Use household cleaning sprays or wipes according to the label instructions. cdc.gov/coronavirus 10/12/2018 This information is not intended to replace advice given to you by your health care provider. Make sure you discuss any questions you have with your health care provider. Document Revised: 03/16/2019 Document Reviewed: 03/16/2019 Elsevier Patient Education  2020 [employer] Inc[employer] What types of side effects do monoclonal antibody drugs cause?  Common side effects  In general, the more common side effects caused by monoclonal antibody drugs include: . Allergic reactions, such as hives or itching . Flu-like signs and  symptoms, including chills, fatigue, fever, and muscle aches and pains . Nausea, vomiting . Diarrhea . Skin rashes . Low blood pressure   The CDC is recommending patients who receive monoclonal antibody treatments wait at least 90 days before being vaccinated.  Currently, there are no data on the safety and efficacy of mRNA COVID-19 vaccines in persons who received monoclonal antibodies or convalescent plasma as part of COVID-19 treatment. Based on the estimated half-life of such therapies as well as evidence suggesting that reinfection is uncommon in the 90 days after initial infection, vaccination should be deferred for at least 90 days, as a precautionary measure until additional information becomes available, to avoid interference of the antibody treatment with vaccine-induced immune responses. If you have any questions or concerns after the infusion please call the Advanced Practice Provider on call at [phone]. This number is ONLY intended for your use regarding questions or concerns about the infusion post-treatment side-effects.  Please do not provide this number to others for use. For return to work notes please contact your primary care provider.   If someone you know is interested in receiving treatment please have them call the COVID hotline at [phone].   

## 2020-04-01 NOTE — Progress Notes (Addendum)
Patient reviewed Fact Sheet for Patients, Parents, and Caregivers for Emergency Use Authorization (EUA) of bamlanivimab and etesevimab for the Treatment of Coronavirus. Patient also reviewed and is agreeable to the estimated cost of treatment. Patient is agreeable to proceed.   

## 2020-04-01 NOTE — Progress Notes (Signed)
  Diagnosis: COVID-19  Physician: Dr. Shan Levans   Procedure: [facility] Med: bamlanivimab\etesevimab infusion - Provided patient with bamlanimivab\etesevimab fact sheet for patients, parents and caregivers prior to infusion.  Complications: No immediate complications noted.  Discharge: Discharged home   Gregary Signs 04/01/2020

## 2020-05-10 ENCOUNTER — Telehealth: Payer: Self-pay | Admitting: *Deleted

## 2020-05-10 NOTE — Chronic Care Management (AMB) (Signed)
  Chronic Care Management   Outreach Note  05/10/2020 Name: Lawrence Jensen MRN: [id number] DOB: 03-07-1951  Lawrence Jensen is a 70 y.o. year old male who is a primary care patient of Cannady, Dorie Rank, NP. I reached out to Lawrence Jensen by phone today in response to a referral sent by Lawrence Jensen's health plan.     An unsuccessful telephone outreach was attempted today. The patient was referred to the case management team for assistance with care management and care coordination.   Follow Up Plan: A HIPAA compliant phone message was left for the patient providing contact information and requesting a return call. The care management team will reach out to the patient again over the next 7 days. If patient returns call to provider office, please advise to call Lawrence Jensen at [phone].  [name]  Care Guide, Lawrence Care Coordination [facility] Management  Direct Dial: [phone]

## 2020-05-15 NOTE — Chronic Care Management (AMB) (Signed)
  Chronic Care Management   Note  05/15/2020 Name: Lawrence Jensen MRN: [id number] DOB: [birth year]  Carl Best Noll is a 70 y.o. year old male who is a primary care patient of Cannady, Barbaraann Faster, NP. I reached out to [name] by phone today in response to a referral sent by Mr. Kaius Daino [employer]'s health plan.     Mr. Dittus was given information about Chronic Care Management services today including:  1. CCM service includes personalized support from designated clinical staff supervised by his physician, including individualized plan of care and coordination with other care providers 2. 24/7 contact phone numbers for assistance for urgent and routine care needs. 3. Service will only be billed when office clinical staff spend 20 minutes or more in a month to coordinate care. 4. Only one practitioner may furnish and bill the service in a calendar month. 5. The patient may stop CCM services at any time (effective at the end of the month) by phone call to the office staff. 6. The patient will be responsible for cost sharing (co-pay) of up to 20% of the service fee (after annual deductible is met).  Patient did not agree to enrollment in care management services and does not wish to consider at this time.  Follow up plan: The care management team is available to follow up with the patient after provider conversation with the patient regarding recommendation for care management engagement and subsequent re-referral to the care management team.   [place] Management

## 2020-05-18 IMAGING — CR RIGHT SHOULDER - 2+ VIEW
1 series · 4 of 4 positions shown · non-contrast
Comparison: None.

CLINICAL DATA: Right-sided posterior shoulder pain for 1 year

EXAM:
RIGHT SHOULDER - 2+ VIEW

[Series 1: dg shoulder right · 0.14mm/px · 4 of 4 slices shown]
[im 1/4]
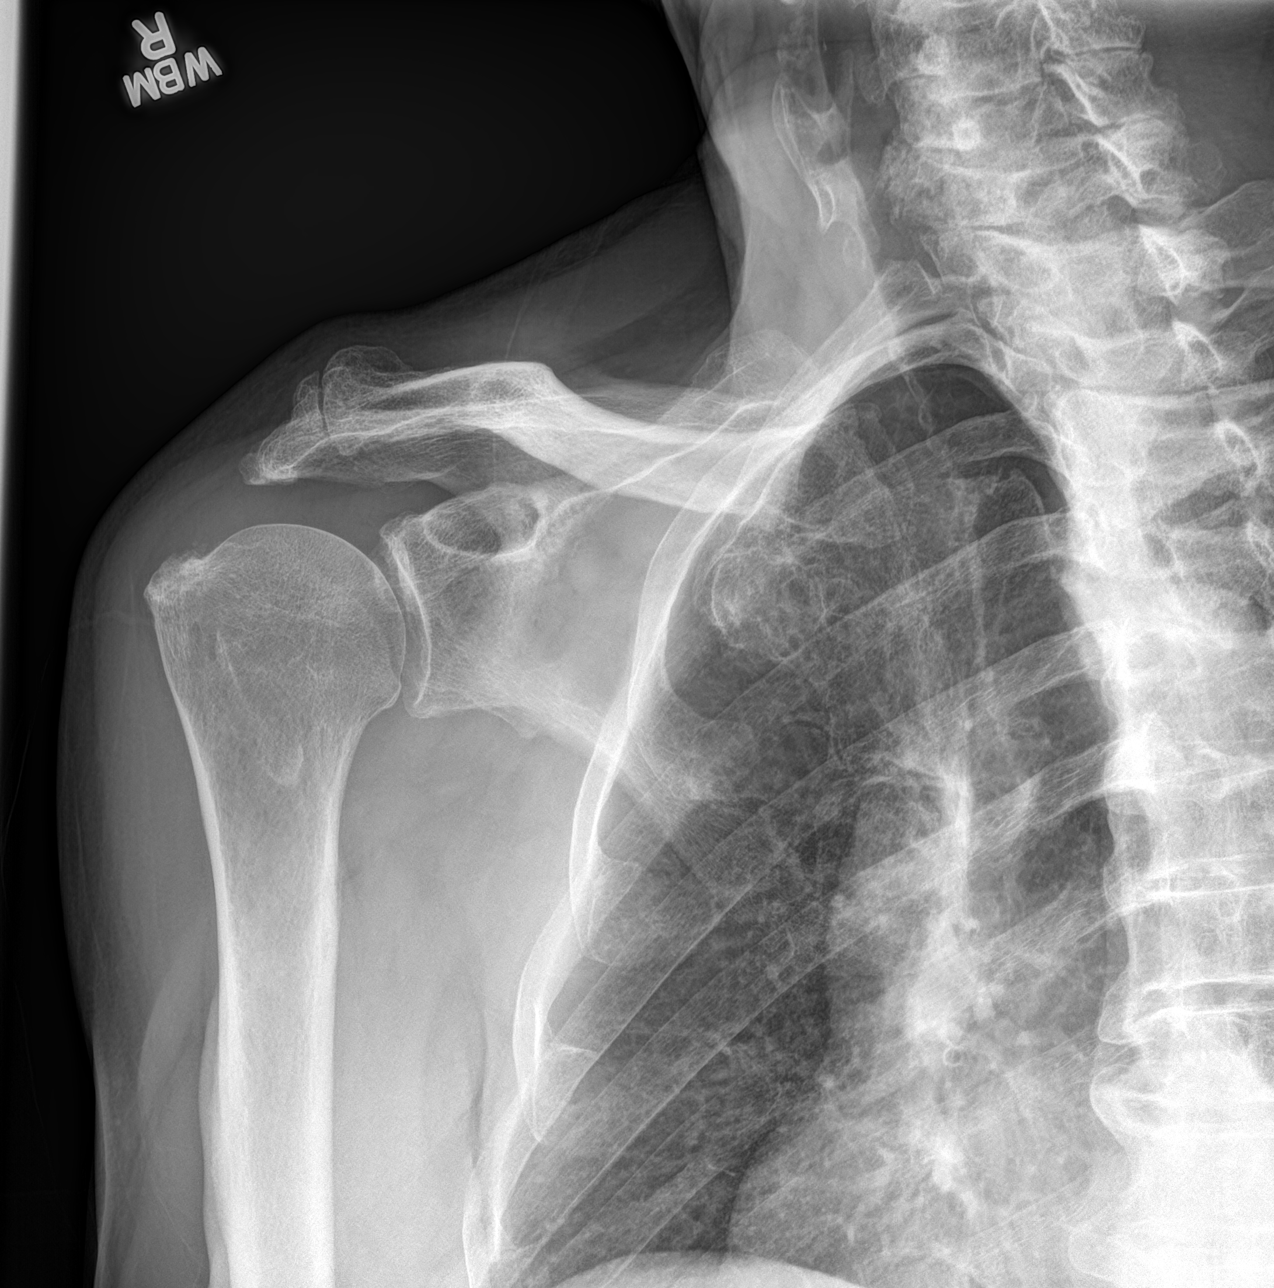
[im 2/4]
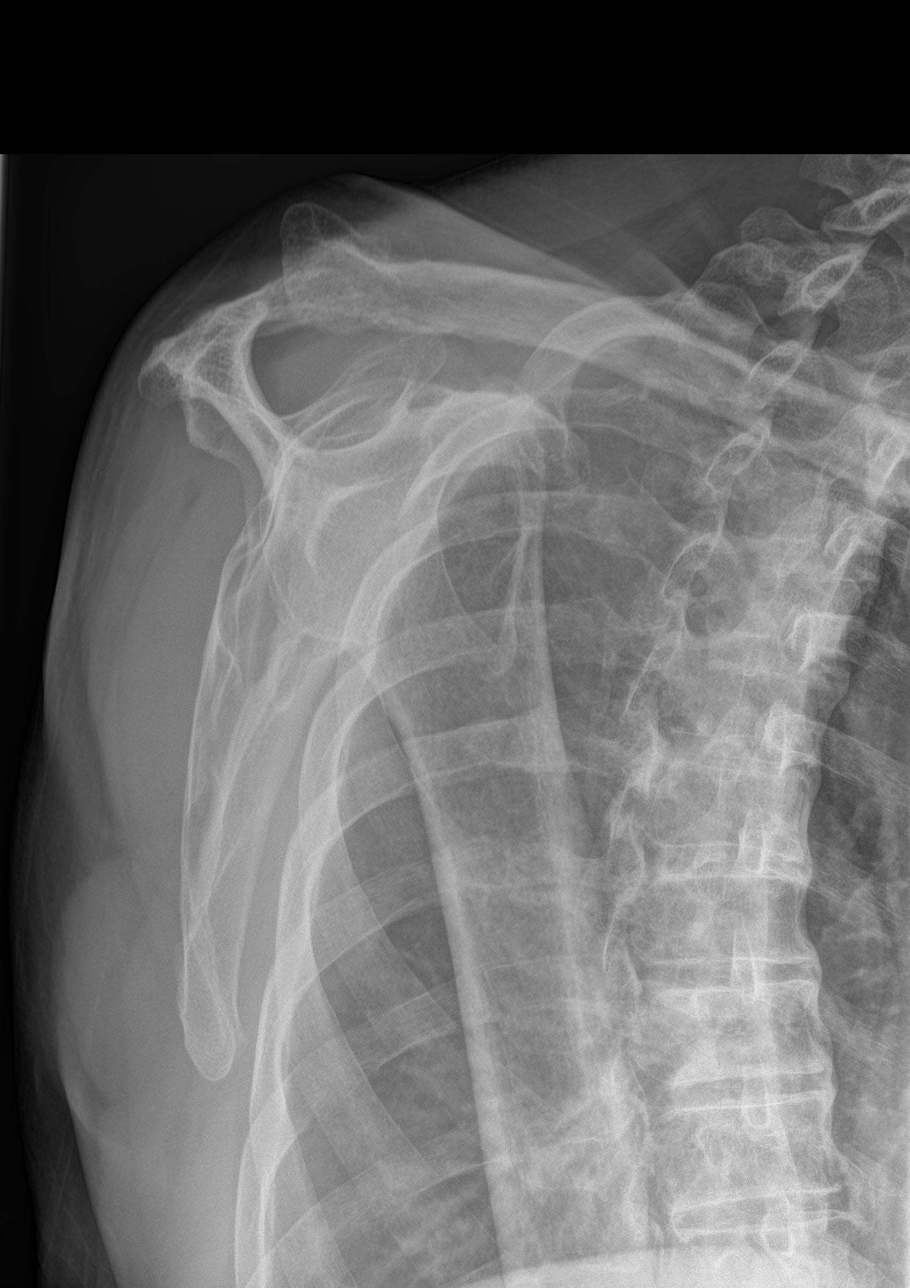
[im 3/4]
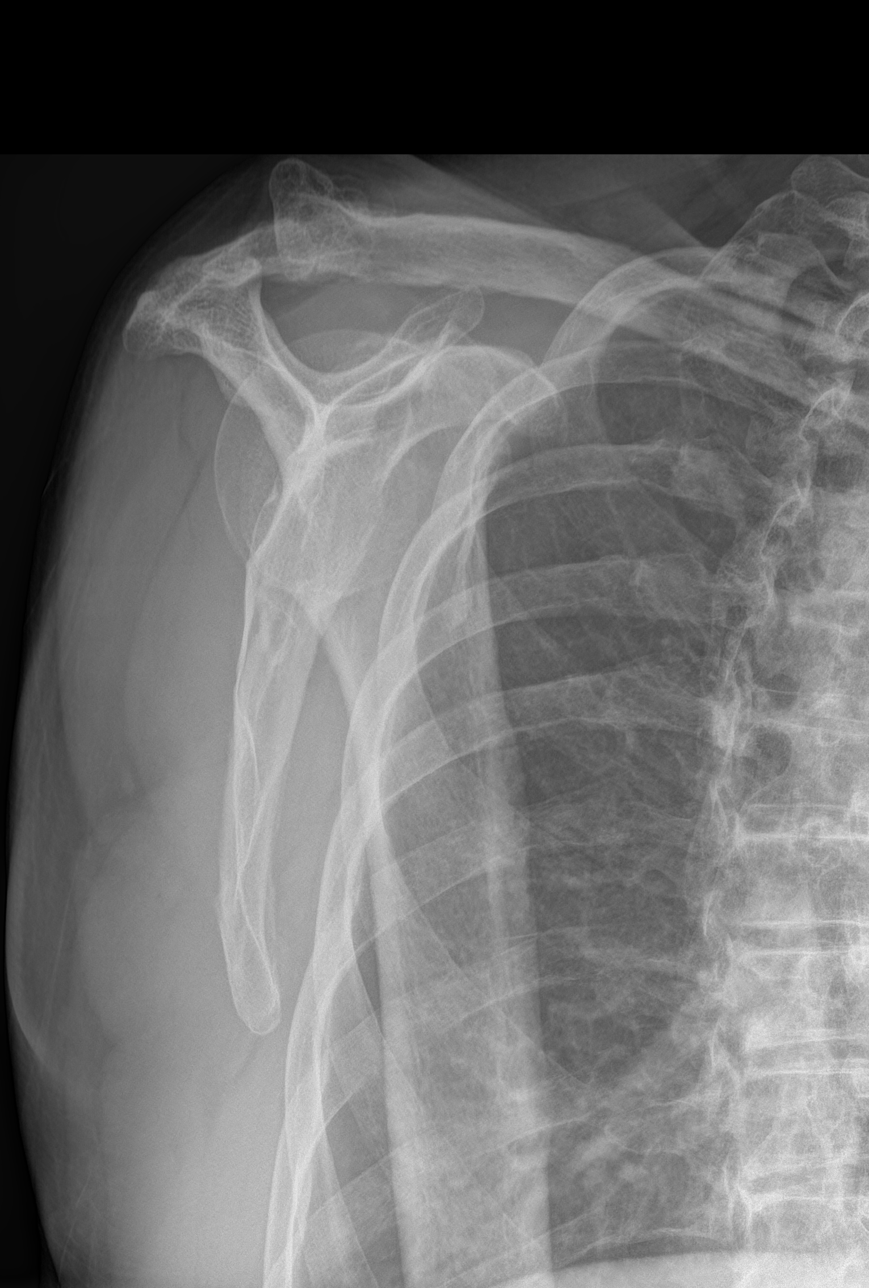
[im 4/4]
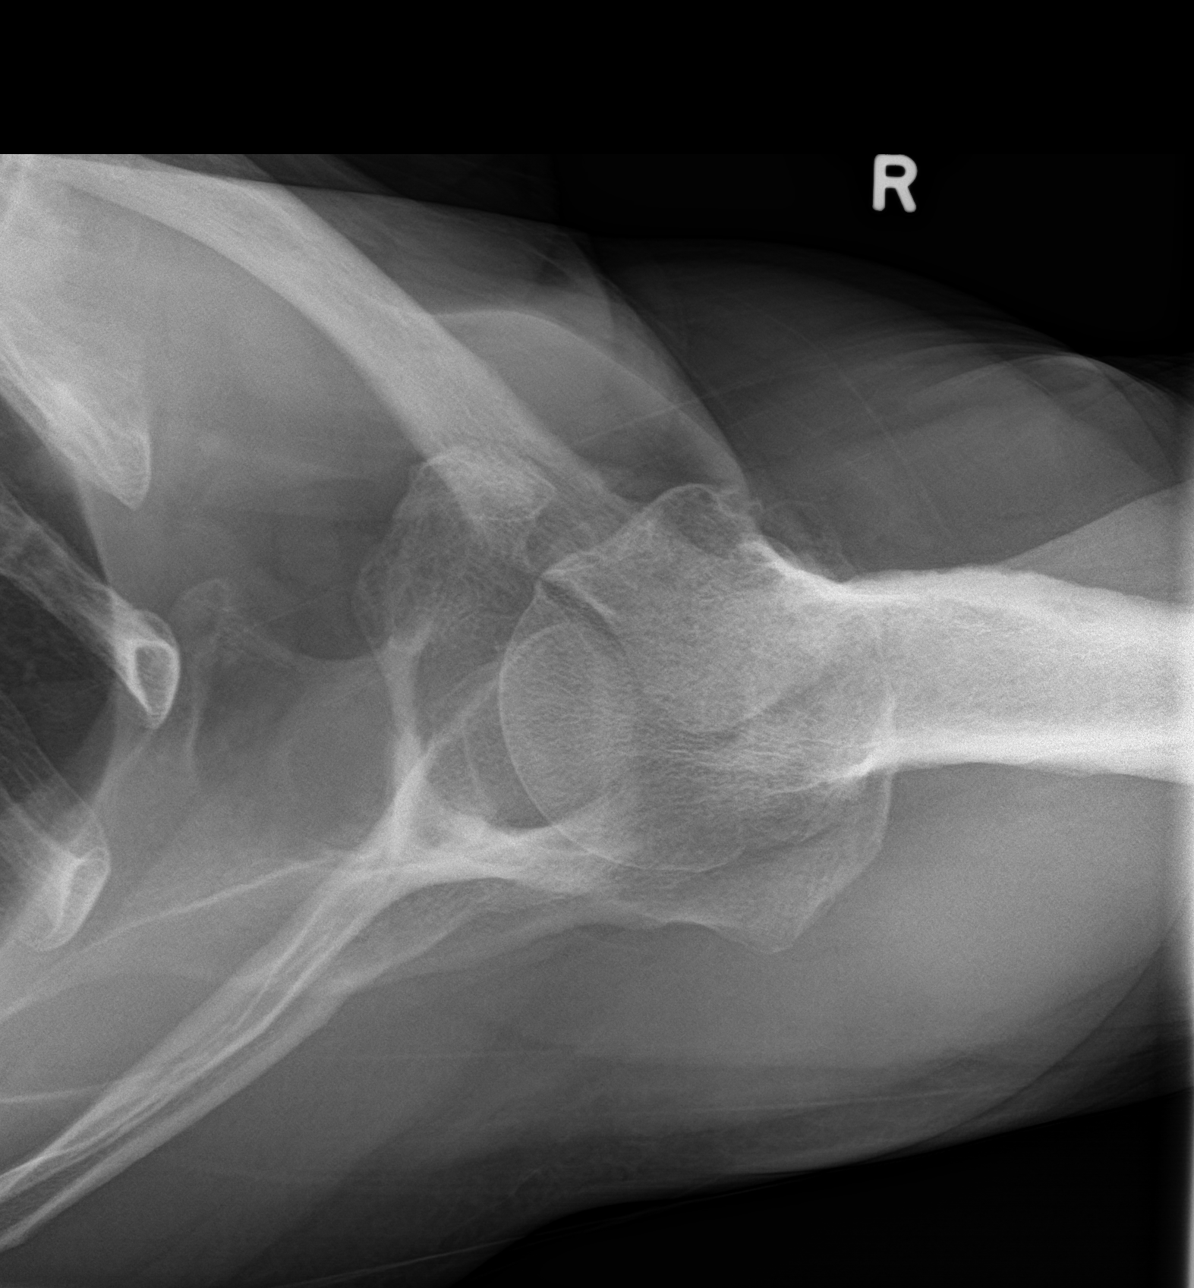

[4 of 4 positions shown; findings below may reference images not displayed]

FINDINGS: Degenerative changes of the acromioclavicular and glenohumeral
articulations are seen. No acute fracture or dislocation is noted.
No soft tissue abnormality is noted. The underlying bony thorax is
within normal limits.
IMPRESSION: Degenerative change without acute abnormality.

## 2020-06-12 ENCOUNTER — Telehealth: Payer: Self-pay | Admitting: Nurse Practitioner

## 2020-06-12 NOTE — Telephone Encounter (Signed)
Opened in error- Pt has refills and did not know. Pt claimed they were not called in. Advised pt all his scripts called in 03/28/20

## 2020-06-27 ENCOUNTER — Encounter: Payer: Self-pay | Admitting: Nurse Practitioner

## 2020-06-27 ENCOUNTER — Other Ambulatory Visit: Payer: Self-pay

## 2020-06-27 ENCOUNTER — Ambulatory Visit (INDEPENDENT_AMBULATORY_CARE_PROVIDER_SITE_OTHER): Payer: Medicare Other | Admitting: Nurse Practitioner

## 2020-06-27 VITALS — BP 135/84 | HR 69 | Temp 98.7°F | Wt 177.8 lb

## 2020-06-27 DIAGNOSIS — E1140 Type 2 diabetes mellitus with diabetic neuropathy, unspecified: Secondary | ICD-10-CM | POA: Diagnosis not present

## 2020-06-27 DIAGNOSIS — Z8616 Personal history of COVID-19: Secondary | ICD-10-CM | POA: Diagnosis not present

## 2020-06-27 DIAGNOSIS — I152 Hypertension secondary to endocrine disorders: Secondary | ICD-10-CM | POA: Diagnosis not present

## 2020-06-27 DIAGNOSIS — G8929 Other chronic pain: Secondary | ICD-10-CM | POA: Diagnosis not present

## 2020-06-27 DIAGNOSIS — E1169 Type 2 diabetes mellitus with other specified complication: Secondary | ICD-10-CM

## 2020-06-27 DIAGNOSIS — M25562 Pain in left knee: Secondary | ICD-10-CM

## 2020-06-27 DIAGNOSIS — E785 Hyperlipidemia, unspecified: Secondary | ICD-10-CM | POA: Diagnosis not present

## 2020-06-27 DIAGNOSIS — Z6828 Body mass index (BMI) 28.0-28.9, adult: Secondary | ICD-10-CM

## 2020-06-27 DIAGNOSIS — E1159 Type 2 diabetes mellitus with other circulatory complications: Secondary | ICD-10-CM

## 2020-06-27 LAB — MICROALBUMIN, URINE WAIVED
Creatinine, Urine Waived: 100 mg/dL (ref 10–300)
Microalb, Ur Waived: 30 mg/L — ABNORMAL HIGH (ref 0–19)

## 2020-06-27 LAB — BAYER DCA HB A1C WAIVED: HB A1C (BAYER DCA - WAIVED): 6.9 % (ref <7.0)

## 2020-06-27 NOTE — Assessment & Plan Note (Signed)
Acute on 03/28/20 with MAB received.  Overall improved at this time with no long haul symptoms.  Continue to monitor sugars closely due to some mild elevations since Covid.

## 2020-06-27 NOTE — Assessment & Plan Note (Signed)
Chronic, ongoing.  A1C 6.9% today, stable, although slight trend up.  Urine ALB 30 and A:C 30-300 today, will continue Lisinopril for kidney protection.  Continue current medication regimen and adjust as needed.  Recommend continued focus on daily exercise, which he has started again, and healthy diet.  Return in 3 months for T2DM.  Could consider addition of Farxiga or Jardiance if elevations in future.

## 2020-06-27 NOTE — Assessment & Plan Note (Signed)
Chronic, ongoing with BP at goal. Continue Lisinopril 20 MG and Amlodipine 5 MG daily. Have advised he continue using Meloxicam minimally and discussed risks with consistent use.  Monitor BP at home daily and focus on DASH diet.  Labs today: CMP.   Return in 3 months for T2DM + HTN/HLD.

## 2020-06-27 NOTE — Patient Instructions (Signed)
Diabetes Mellitus and Nutrition, Adult When you have diabetes, or diabetes mellitus, it is very important to have healthy eating habits because your blood sugar (glucose) levels are greatly affected by what you eat and drink. Eating healthy foods in the right amounts, at about the same times every day, can help you:  Control your blood glucose.  Lower your risk of heart disease.  Improve your blood pressure.  Reach or maintain a healthy weight. What can affect my meal plan? Every person with diabetes is different, and each person has different needs for a meal plan. Your health care provider may recommend that you work with a dietitian to make a meal plan that is best for you. Your meal plan may vary depending on factors such as:  The calories you need.  The medicines you take.  Your weight.  Your blood glucose, blood pressure, and cholesterol levels.  Your activity level.  Other health conditions you have, such as heart or kidney disease. How do carbohydrates affect me? Carbohydrates, also called carbs, affect your blood glucose level more than any other type of food. Eating carbs naturally raises the amount of glucose in your blood. Carb counting is a method for keeping track of how many carbs you eat. Counting carbs is important to keep your blood glucose at a healthy level, especially if you use insulin or take certain oral diabetes medicines. It is important to know how many carbs you can safely have in each meal. This is different for every person. Your dietitian can help you calculate how many carbs you should have at each meal and for each snack. How does alcohol affect me? Alcohol can cause a sudden decrease in blood glucose (hypoglycemia), especially if you use insulin or take certain oral diabetes medicines. Hypoglycemia can be a life-threatening condition. Symptoms of hypoglycemia, such as sleepiness, dizziness, and confusion, are similar to symptoms of having too much  alcohol.  Do not drink alcohol if: ? Your health care provider tells you not to drink. ? You are pregnant, may be pregnant, or are planning to become pregnant.  If you drink alcohol: ? Do not drink on an empty stomach. ? Limit how much you use to:  0-1 drink a day for women.  0-2 drinks a day for men. ? Be aware of how much alcohol is in your drink. In the [place] one drink equals one 12 oz bottle of beer (355 mL), one 5 oz glass of wine (148 mL), or one 1 oz glass of hard liquor (44 mL). ? Keep yourself hydrated with water, diet soda, or unsweetened iced tea.  Keep in mind that regular soda, juice, and other mixers may contain a lot of sugar and must be counted as carbs. What are tips for following this plan? Reading food labels  Start by checking the serving size on the "Nutrition Facts" label of packaged foods and drinks. The amount of calories, carbs, fats, and other nutrients listed on the label is based on one serving of the item. Many items contain more than one serving per package.  Check the total grams (g) of carbs in one serving. You can calculate the number of servings of carbs in one serving by dividing the total carbs by 15. For example, if a food has 30 g of total carbs per serving, it would be equal to 2 servings of carbs.  Check the number of grams (g) of saturated fats and trans fats in one serving. Choose foods that have   a low amount or none of these fats.  Check the number of milligrams (mg) of salt (sodium) in one serving. Most people should limit total sodium intake to less than 2,300 mg per day.  Always check the nutrition information of foods labeled as "low-fat" or "nonfat." These foods may be higher in added sugar or refined carbs and should be avoided.  Talk to your dietitian to identify your daily goals for nutrients listed on the label. Shopping  Avoid buying canned, pre-made, or processed foods. These foods tend to be high in fat, sodium, and added  sugar.  Shop around the outside edge of the grocery store. This is where you will most often find fresh fruits and vegetables, bulk grains, fresh meats, and fresh dairy. Cooking  Use low-heat cooking methods, such as baking, instead of high-heat cooking methods like deep frying.  Cook using healthy oils, such as olive, canola, or sunflower oil.  Avoid cooking with butter, cream, or high-fat meats. Meal planning  Eat meals and snacks regularly, preferably at the same times every day. Avoid going long periods of time without eating.  Eat foods that are high in fiber, such as fresh fruits, vegetables, beans, and whole grains. Talk with your dietitian about how many servings of carbs you can eat at each meal.  Eat 4-6 oz (112-168 g) of lean protein each day, such as lean meat, chicken, fish, eggs, or tofu. One ounce (oz) of lean protein is equal to: ? 1 oz (28 g) of meat, chicken, or fish. ? 1 egg. ?  cup (62 g) of tofu.  Eat some foods each day that contain healthy fats, such as avocado, nuts, seeds, and fish.   What foods should I eat? Fruits Berries. Apples. Oranges. Peaches. Apricots. Plums. Grapes. Mango. Papaya. Pomegranate. Kiwi. Cherries. Vegetables Lettuce. Spinach. Leafy greens, including kale, chard, collard greens, and mustard greens. Beets. Cauliflower. Cabbage. Broccoli. Carrots. Green beans. Tomatoes. Peppers. Onions. Cucumbers. Brussels sprouts. Grains Whole grains, such as whole-wheat or whole-grain bread, crackers, tortillas, cereal, and pasta. Unsweetened oatmeal. Quinoa. Brown or wild rice. Meats and other proteins Seafood. Poultry without skin. Lean cuts of poultry and beef. Tofu. Nuts. Seeds. Dairy Low-fat or fat-free dairy products such as milk, yogurt, and cheese. The items listed above may not be a complete list of foods and beverages you can eat. Contact a dietitian for more information. What foods should I avoid? Fruits Fruits canned with  syrup. Vegetables Canned vegetables. Frozen vegetables with butter or cream sauce. Grains Refined white flour and flour products such as bread, pasta, snack foods, and cereals. Avoid all processed foods. Meats and other proteins Fatty cuts of meat. Poultry with skin. Breaded or fried meats. Processed meat. Avoid saturated fats. Dairy Full-fat yogurt, cheese, or milk. Beverages Sweetened drinks, such as soda or iced tea. The items listed above may not be a complete list of foods and beverages you should avoid. Contact a dietitian for more information. Questions to ask a health care provider  Do I need to meet with a diabetes educator?  Do I need to meet with a dietitian?  What number can I call if I have questions?  When are the best times to check my blood glucose? Where to find more information:  American Diabetes Association: diabetes.org  Academy of Nutrition and Dietetics: www.eatright.org  National Institute of Diabetes and Digestive and Kidney Diseases: www.niddk.nih.gov  Association of Diabetes Care and Education Specialists: www.diabeteseducator.org Summary  It is important to have healthy eating   habits because your blood sugar (glucose) levels are greatly affected by what you eat and drink.  A healthy meal plan will help you control your blood glucose and maintain a healthy lifestyle.  Your health care provider may recommend that you work with a dietitian to make a meal plan that is best for you.  Keep in mind that carbohydrates (carbs) and alcohol have immediate effects on your blood glucose levels. It is important to count carbs and to use alcohol carefully. This information is not intended to replace advice given to you by your health care provider. Make sure you discuss any questions you have with your health care provider. Document Revised: 03/07/2019 Document Reviewed: 03/07/2019 Elsevier Patient Education  2021 [employer]  

## 2020-06-27 NOTE — Progress Notes (Signed)
BP 135/84   Pulse 69   Temp 98.7 F (37.1 C) (Oral)   Wt 177 lb 12.8 oz (80.6 kg)   SpO2 97%   BMI 28.81 kg/m    Subjective:    Patient ID: Lawrence Jensen, male    DOB: 06-16-1950, 70 y.o.   MRN: [id number]  HPI: Lawrence Jensen is a 70 y.o. male  Chief Complaint  Patient presents with  . Diabetes    Patient states his readings have been running a little high and he does not know what he is doing wrong. Patient states he tries to eat fresh vegetables for lunch.   . Hyperlipidemia  . Hypertension   DIABETES Takes Metformin 1000 MG two times a day.  Last A1C 6.6%.  He reports he normally eats a good diet, but occasionally eats sweets. Has history of Covid on 03/28/20 and received MAB -- overall is feeling better.  Reports he has had some elevations in sugars recently, but unsure what he is doing wrong.   Hypoglycemic episodes:no Polydipsia/polyuria: no Visual disturbance: no Chest pain: no Paresthesias: no Glucose Monitoring: yes  Accucheck frequency: Daily  Fasting glucose: 120-140 range -- 171 this morning, went out to eat last night, had salad with 3 scoops of dressing  Post prandial:  Evening:  Before meals: Taking Insulin?: no  Long acting insulin:  Short acting insulin: Blood Pressure Monitoring: daily Retinal Examination: Not Up to Date -- [facility] Exam: Up to Date Pneumovax: refused Influenza: refused Aspirin: yes   HYPERTENSION / HYPERLIPIDEMIA Continues on Lisinopril 10 MG and Amlodipine 5 MG + Crestor 20 MG daily.  Last LDL in December was 48.  Takes occasional Meloxicam for chronic knee pain -- have had discussions with him to minimally use this and only as needed due to risks with aging. Satisfied with current treatment? yes Duration of hypertension: chronic BP monitoring frequency: daily BP range: 130-140/80's at home BP medication side effects: no Duration of hyperlipidemia: chronic Cholesterol medication side effects:  no Cholesterol supplements: fish oil Medication compliance: good compliance Aspirin: yes Recent stressors: no Recurrent headaches: no Visual changes: no Palpitations: no Dyspnea: no Chest pain: no Lower extremity edema: no Dizzy/lightheaded: no   Relevant past medical, surgical, family and social history reviewed and updated as indicated. Interim medical history since our last visit reviewed. Allergies and medications reviewed and updated.  Review of Systems  Constitutional: Negative for activity change, chills, diaphoresis, fatigue and fever.  Respiratory: Negative for cough, chest tightness, shortness of breath and wheezing.   Cardiovascular: Negative for chest pain, palpitations and leg swelling.  Gastrointestinal: Negative.   Endocrine: Negative for cold intolerance, heat intolerance, polydipsia, polyphagia and polyuria.  Neurological: Negative.   Psychiatric/Behavioral: Negative.     Per HPI unless specifically indicated above     Objective:    BP 135/84   Pulse 69   Temp 98.7 F (37.1 C) (Oral)   Wt 177 lb 12.8 oz (80.6 kg)   SpO2 97%   BMI 28.81 kg/m   Wt Readings from Last 3 Encounters:  06/27/20 177 lb 12.8 oz (80.6 kg)  03/28/20 178 lb 9.6 oz (81 kg)  12/26/19 181 lb (82.1 kg)    Physical Exam Vitals and nursing note reviewed.  Constitutional:      General: He is awake. He is not in acute distress.    Appearance: He is well-developed. He is not ill-appearing.  HENT:     Head: Normocephalic and atraumatic.  Right Ear: Hearing normal. No drainage.     Left Ear: Hearing normal. No drainage.     Nose: Nose normal.  Eyes:     General: Lids are normal.        Right eye: No discharge.        Left eye: No discharge.     Conjunctiva/sclera: Conjunctivae normal.     Pupils: Pupils are equal, round, and reactive to light.  Neck:     Thyroid: No thyromegaly.     Vascular: No carotid bruit.  Cardiovascular:     Rate and Rhythm: Normal rate and regular  rhythm.     Heart sounds: Normal heart sounds, S1 normal and S2 normal. No murmur heard. No gallop.   Pulmonary:     Effort: Pulmonary effort is normal. No accessory muscle usage or respiratory distress.     Breath sounds: Normal breath sounds.  Abdominal:     General: Bowel sounds are normal.     Palpations: Abdomen is soft.  Musculoskeletal:        General: Normal range of motion.     Cervical back: Normal range of motion and neck supple.     Right lower leg: No edema.     Left lower leg: No edema.  Skin:    General: Skin is warm and dry.     Capillary Refill: Capillary refill takes less than 2 seconds.  Neurological:     Mental Status: He is alert and oriented to person, place, and time.     Deep Tendon Reflexes: Reflexes are normal and symmetric.  Psychiatric:        Attention and Perception: Attention normal.        Mood and Affect: Mood normal.        Behavior: Behavior normal. Behavior is cooperative.        Thought Content: Thought content normal.    Results for orders placed or performed in visit on 03/28/20  Novel Coronavirus, NAA ([facility])   Specimen: Nasopharyngeal(NP) swabs in vial transport medium  Result Value Ref Range   SARS-CoV-2, NAA Detected (A) Not Detected  SARS-COV-2, NAA 2 DAY TAT  Result Value Ref Range   SARS-CoV-2, NAA 2 DAY TAT Performed   Basic metabolic panel  Result Value Ref Range   Glucose 144 (H) 65 - 99 mg/dL   BUN 15 8 - 27 mg/dL   Creatinine, Ser 6.38 0.76 - 1.27 mg/dL   GFR calc non Af Amer 62 >59 mL/min/1.73   GFR calc Af Amer 72 >59 mL/min/1.73   BUN/Creatinine Ratio 13 10 - 24   Sodium 139 134 - 144 mmol/L   Potassium 4.3 3.5 - 5.2 mmol/L   Chloride 98 96 - 106 mmol/L   CO2 24 20 - 29 mmol/L   Calcium 9.4 8.6 - 10.2 mg/dL  Bayer DCA Hb L3T Waived  Result Value Ref Range   HB A1C (BAYER DCA - WAIVED) 6.6 <7.0 %  Lipid Panel w/o Chol/HDL Ratio  Result Value Ref Range   Cholesterol, Total 98 (L) 100 - 199 mg/dL    Triglycerides 77 0 - 149 mg/dL   HDL 34 (L) >34 mg/dL   VLDL Cholesterol Cal 16 5 - 40 mg/dL   LDL Chol Calc (NIH) 48 0 - 99 mg/dL  Veritor Flu A/B Waived  Result Value Ref Range   Influenza A Negative Negative   Influenza B Negative Negative      Assessment & Plan:   Problem List Items  Addressed This Visit      Cardiovascular and Mediastinum   Hypertension associated with diabetes (HCC)    Chronic, ongoing with BP at goal. Continue Lisinopril 20 MG and Amlodipine 5 MG daily. Have advised he continue using Meloxicam minimally and discussed risks with consistent use.  Monitor BP at home daily and focus on DASH diet.  Labs today: CMP.   Return in 3 months for T2DM + HTN/HLD.        Relevant Orders   Bayer DCA Hb A1c Waived   Microalbumin, Urine Waived     Endocrine   Type 2 diabetes mellitus with diabetic neuropathy, without long-term current use of insulin (HCC) - Primary    Chronic, ongoing.  A1C 6.9% today, stable, although slight trend up.  Urine ALB 30 and A:C 30-300 today, will continue Lisinopril for kidney protection.  Continue current medication regimen and adjust as needed.  Recommend continued focus on daily exercise, which he has started again, and healthy diet.  Return in 3 months for T2DM.  Could consider addition of Farxiga or Jardiance if elevations in future.      Relevant Orders   Bayer DCA Hb A1c Waived   Microalbumin, Urine Waived   Hyperlipidemia associated with type 2 diabetes mellitus (HCC)    Chronic, ongoing.  Continue current medication regimen and adjust as needed.  Lipid panel today, recent LDL below goal.      Relevant Orders   Bayer DCA Hb A1c Waived   Comprehensive metabolic panel   Lipid Panel w/o Chol/HDL Ratio     Other   Chronic pain of left knee    Continue to recommend minimal use of Meloxicam, discussed at length risks.      BMI 28.0-28.9,adult    Recommended eating smaller high protein, low fat meals more frequently and exercising 30  mins a day 5 times a week with a goal of 10-15lb weight loss in the next 3 months. Patient voiced their understanding and motivation to adhere to these recommendations.       History of 2019 novel coronavirus disease (COVID-19)    Acute on 03/28/20 with MAB received.  Overall improved at this time with no long haul symptoms.  Continue to monitor sugars closely due to some mild elevations since Covid.          Follow up plan: Return in about 3 months (around 09/27/2020) for Annual physical with diabetes check.

## 2020-06-27 NOTE — Assessment & Plan Note (Signed)
Recommended eating smaller high protein, low fat meals more frequently and exercising 30 mins a day 5 times a week with a goal of 10-15lb weight loss in the next 3 months. Patient voiced their understanding and motivation to adhere to these recommendations.  

## 2020-06-27 NOTE — Assessment & Plan Note (Signed)
Chronic, ongoing.  Continue current medication regimen and adjust as needed.  Lipid panel today, recent LDL below goal.

## 2020-06-27 NOTE — Assessment & Plan Note (Signed)
Continue to recommend minimal use of Meloxicam, discussed at length risks.

## 2020-06-28 LAB — COMPREHENSIVE METABOLIC PANEL
ALT: 19 IU/L (ref 0–44)
AST: 22 IU/L (ref 0–40)
Albumin/Globulin Ratio: 2.2 (ref 1.2–2.2)
Albumin: 5 g/dL — ABNORMAL HIGH (ref 3.8–4.8)
Alkaline Phosphatase: 94 IU/L (ref 44–121)
BUN/Creatinine Ratio: 20 (ref 10–24)
BUN: 20 mg/dL (ref 8–27)
Bilirubin Total: 0.5 mg/dL (ref 0.0–1.2)
CO2: 22 mmol/L (ref 20–29)
Calcium: 10 mg/dL (ref 8.6–10.2)
Chloride: 100 mmol/L (ref 96–106)
Creatinine, Ser: 1.02 mg/dL (ref 0.76–1.27)
Globulin, Total: 2.3 g/dL (ref 1.5–4.5)
Glucose: 159 mg/dL — ABNORMAL HIGH (ref 65–99)
Potassium: 4.5 mmol/L (ref 3.5–5.2)
Sodium: 140 mmol/L (ref 134–144)
Total Protein: 7.3 g/dL (ref 6.0–8.5)
eGFR: 80 mL/min/{1.73_m2} (ref >59)

## 2020-06-28 LAB — LIPID PANEL W/O CHOL/HDL RATIO
Cholesterol, Total: 113 mg/dL (ref 100–199)
HDL: 43 mg/dL (ref >39)
LDL Chol Calc (NIH): 51 mg/dL (ref 0–99)
Triglycerides: 100 mg/dL (ref 0–149)
VLDL Cholesterol Cal: 19 mg/dL (ref 5–40)

## 2020-06-28 NOTE — Progress Notes (Signed)
Good afternoon, please let Korvin know his labs have returned and his kidney and liver function remain stable.  Cholesterol levels at goal.  Continue all current medications.  Have a wonderful day!! Keep being excellent!!  Thank you for allowing me to participate in your care. Kindest regards, Hernandez Losasso

## 2020-10-03 ENCOUNTER — Ambulatory Visit (INDEPENDENT_AMBULATORY_CARE_PROVIDER_SITE_OTHER): Payer: Medicare Other | Admitting: Nurse Practitioner

## 2020-10-03 ENCOUNTER — Telehealth: Payer: Self-pay | Admitting: *Deleted

## 2020-10-03 ENCOUNTER — Encounter: Payer: Self-pay | Admitting: Nurse Practitioner

## 2020-10-03 ENCOUNTER — Other Ambulatory Visit: Payer: Self-pay

## 2020-10-03 VITALS — BP 126/75 | HR 62 | Temp 98.0°F | Ht 65.0 in | Wt 177.8 lb

## 2020-10-03 DIAGNOSIS — E1169 Type 2 diabetes mellitus with other specified complication: Secondary | ICD-10-CM

## 2020-10-03 DIAGNOSIS — Z87891 Personal history of nicotine dependence: Secondary | ICD-10-CM | POA: Diagnosis not present

## 2020-10-03 DIAGNOSIS — E1140 Type 2 diabetes mellitus with diabetic neuropathy, unspecified: Secondary | ICD-10-CM | POA: Diagnosis not present

## 2020-10-03 DIAGNOSIS — E785 Hyperlipidemia, unspecified: Secondary | ICD-10-CM

## 2020-10-03 DIAGNOSIS — Z6828 Body mass index (BMI) 28.0-28.9, adult: Secondary | ICD-10-CM

## 2020-10-03 DIAGNOSIS — K219 Gastro-esophageal reflux disease without esophagitis: Secondary | ICD-10-CM

## 2020-10-03 DIAGNOSIS — Z0000 Encounter for general adult medical examination without abnormal findings: Secondary | ICD-10-CM | POA: Diagnosis not present

## 2020-10-03 DIAGNOSIS — I152 Hypertension secondary to endocrine disorders: Secondary | ICD-10-CM | POA: Diagnosis not present

## 2020-10-03 DIAGNOSIS — E1159 Type 2 diabetes mellitus with other circulatory complications: Secondary | ICD-10-CM | POA: Diagnosis not present

## 2020-10-03 DIAGNOSIS — K222 Esophageal obstruction: Secondary | ICD-10-CM | POA: Diagnosis not present

## 2020-10-03 DIAGNOSIS — N5201 Erectile dysfunction due to arterial insufficiency: Secondary | ICD-10-CM

## 2020-10-03 LAB — BAYER DCA HB A1C WAIVED: HB A1C (BAYER DCA - WAIVED): 6.7 % (ref <7.0)

## 2020-10-03 MED ORDER — LISINOPRIL 20 MG PO TABS: 20.0000 mg | ORAL_TABLET | Freq: Every day | ORAL | 4 refills | Status: DC

## 2020-10-03 MED ORDER — AMLODIPINE BESYLATE 5 MG PO TABS: 5.0000 mg | ORAL_TABLET | Freq: Every day | ORAL | 4 refills | Status: DC

## 2020-10-03 MED ORDER — MELOXICAM 15 MG PO TABS: 15.0000 mg | ORAL_TABLET | Freq: Every day | ORAL | 4 refills | Status: DC

## 2020-10-03 MED ORDER — ROSUVASTATIN CALCIUM 20 MG PO TABS: 20.0000 mg | ORAL_TABLET | Freq: Every day | ORAL | 4 refills | Status: DC

## 2020-10-03 MED ORDER — METFORMIN HCL 500 MG PO TABS: 1000.0000 mg | ORAL_TABLET | Freq: Two times a day (BID) | ORAL | 4 refills | Status: DC

## 2020-10-03 NOTE — Assessment & Plan Note (Signed)
Chronic, stable without medication.  Uses Magnesium BID, check level today and adjust regimen as needed.

## 2020-10-03 NOTE — Assessment & Plan Note (Signed)
Chronic, ongoing with BP at goal in office and on home checks. Continue Lisinopril 20 MG and Amlodipine 5 MG daily -- refills sent in. Have advised he continue using Meloxicam minimally and discussed risks with consistent use.  Monitor BP at home daily and focus on DASH diet.  Labs today: CMP, TSH.   Return in 3 months for T2DM + HTN/HLD.

## 2020-10-03 NOTE — Progress Notes (Signed)
BP 126/75   Pulse 62   Temp 98 F (36.7 C) (Oral)   Ht 5' 5"  (1.651 m)   Wt 177 lb 12.8 oz (80.6 kg)   SpO2 95%   BMI 29.59 kg/m    Subjective:    Patient ID: Lawrence Jensen, male    DOB: 14-Feb-1951, 70 y.o.   MRN: [id number]  HPI: Lawrence Jensen is a 70 y.o. male presenting on 10/03/2020 for comprehensive medical examination. Current medical complaints include:none  He currently lives with: wife Interim Problems from his last visit: no   DIABETES Takes Metformin 1000 MG two times a day.  Last A1C March was 6.9%.  Eats occasional sweets at home.  Takes Magnesium 400 MG BID for GERD and occasional cramps. Hypoglycemic episodes:no Polydipsia/polyuria: no Visual disturbance: no Chest pain: no Paresthesias: no Glucose Monitoring: yes             Accucheck frequency: Daily             Fasting glucose: 140-160             Post prandial:             Evening:             Before meals: Taking Insulin?: no             Long acting insulin:             Short acting insulin: Blood Pressure Monitoring: daily Retinal Examination: Not Up to Date -- [facility] Exam: Up to Date Pneumovax: refused Influenza: refused Aspirin: yes    HYPERTENSION / HYPERLIPIDEMIA Continues on Lisinopril 20 MG and Amlodipine 5 MG + Crestor 20 MG daily. Is taking Meloxicam just occasionally for arthritis pain. Satisfied with current treatment? yes Duration of hypertension: chronic BP monitoring frequency: daily BP range: 120-130/80's at home BP medication side effects: no Duration of hyperlipidemia: chronic Cholesterol medication side effects: no Cholesterol supplements: fish oil Medication compliance: good compliance Aspirin: yes Recent stressors: no Recurrent headaches: no Visual changes: no Palpitations: no Dyspnea: no Chest pain: no Lower extremity edema: no Dizzy/lightheaded: no   Functional Status Survey: Is the patient deaf or have difficulty hearing?: No Does the  patient have difficulty seeing, even when wearing glasses/contacts?: No Does the patient have difficulty concentrating, remembering, or making decisions?: No Does the patient have difficulty walking or climbing stairs?: No Does the patient have difficulty dressing or bathing?: No Does the patient have difficulty doing errands alone such as visiting a doctor's office or shopping?: No  FALL RISK: Fall Risk  12/04/2019 09/06/2019 11/28/2018 11/17/2017 11/09/2017  Falls in the past year? 0 0 0 No No  Number falls in past yr: - 0 - - -  Injury with Fall? - 0 - - -  Risk for fall due to : Medication side effect - - - -  Follow up Falls evaluation completed;Education provided;Falls prevention discussed Falls evaluation completed - - -    Depression Screen Depression screen [facility] 2/9 10/03/2020 12/04/2019 09/06/2019 11/28/2018 11/17/2017  Decreased Interest 0 0 0 0 0  Down, Depressed, Hopeless 0 0 0 0 0  PHQ - 2 Score 0 0 0 0 0  Altered sleeping - - - - 1  Tired, decreased energy - - - - 2  Change in appetite - - - - 0  Feeling bad or failure about yourself  - - - - 0  Trouble concentrating - - - - 0  Moving slowly or fidgety/restless - - - - 0  Suicidal thoughts - - - - 0  PHQ-9 Score - - - - 3    Advanced Directives <no information>  Past Medical History:  Past Medical History:  Diagnosis Date   Diabetes mellitus without complication ([place])    ED (erectile dysfunction)    Hyperlipidemia    Hypertension     Surgical History:  Past Surgical History:  Procedure Laterality Date   APPENDECTOMY  1999    Medications:  Current Outpatient Medications on File Prior to Visit  Medication Sig   ACCU-CHEK AVIVA PLUS test strip USE AS INSTRUCTED   aspirin EC 81 MG tablet Take 81 mg by mouth daily.   Blood Glucose Calibration (OT ULTRA/FASTTK CNTRL SOLN) SOLN    Blood Glucose Monitoring Suppl (ONE TOUCH ULTRA 2) w/Device KIT Inject 1 kit into the skin 3 (three) times daily.   COD LIVER OIL PO Take  3 tablets by mouth daily.   Magnesium 400 MG TABS Take 400 mg by mouth 2 (two) times daily.   No current facility-administered medications on file prior to visit.    Allergies:  No Known Allergies  Social History:  Social History   Socioeconomic History   Marital status: Married    Spouse name: Not on file   Number of children: Not on file   Years of education: Not on file   Highest education level: Associate degree: academic program  Occupational History   Occupation: retired  Tobacco Use   Smoking status: Former    Packs/day: 1.00    Years: 35.00    Pack years: 35.00    Types: Cigarettes    Quit date: 04/03/1995    Years since quitting: 25.5   Smokeless tobacco: Never  Vaping Use   Vaping Use: Never used  Substance and Sexual Activity   Alcohol use: No   Drug use: No   Sexual activity: Not Currently  Other Topics Concern   Not on file  Social History Narrative   Not on file   Social Determinants of Health   Financial Resource Strain: Low Risk    Difficulty of Paying Living Expenses: Not hard at all  Food Insecurity: No Food Insecurity   Worried About Charity fundraiser in the Last Year: Never true   [place] in the Last Year: Never true  Transportation Needs: No Transportation Needs   Lack of Transportation (Medical): No   Lack of Transportation (Non-Medical): No  Physical Activity: Sufficiently Active   Days of Exercise per Week: 5 days   Minutes of Exercise per Session: 40 min  Stress: No Stress Concern Present   Feeling of Stress : Not at all  Social Connections: Socially Integrated   Frequency of Communication with Friends and Family: More than three times a week   Frequency of Social Gatherings with Friends and Family: More than three times a week   Attends Religious Services: 1 to 4 times per year   Active Member of [employer] or Organizations: Yes   Attends Archivist Meetings: 1 to 4 times per year   Marital Status: Married   Human resources officer Violence: Not At Risk   Fear of Current or Ex-Partner: No   Emotionally Abused: No   Physically Abused: No   Sexually Abused: No   Social History   Tobacco Use  Smoking Status Former   Packs/day: 1.00   Years: 35.00   Pack years: 35.00   Types:  Cigarettes   Quit date: 04/03/1995   Years since quitting: 25.5  Smokeless Tobacco Never   Social History   Substance and Sexual Activity  Alcohol Use No    Family History:  Family History  Problem Relation Age of Onset   Heart disease Mother    Heart disease Father    Congestive Heart Failure Father    Cancer Father        prostate   Diabetes Father    Stroke Father    Hypertension Father    Cancer Brother    Stroke Maternal Grandmother    Heart disease Brother    COPD Neg Hx     Past medical history, surgical history, medications, allergies, family history and social history reviewed with patient today and changes made to appropriate areas of the chart.   Review of Systems - negative All other ROS negative except what is listed above and in the HPI.      Objective:    BP 126/75   Pulse 62   Temp 98 F (36.7 C) (Oral)   Ht 5' 5"  (1.651 m)   Wt 177 lb 12.8 oz (80.6 kg)   SpO2 95%   BMI 29.59 kg/m   Wt Readings from Last 3 Encounters:  10/03/20 177 lb 12.8 oz (80.6 kg)  06/27/20 177 lb 12.8 oz (80.6 kg)  03/28/20 178 lb 9.6 oz (81 kg)    Physical Exam Vitals and nursing note reviewed.  Constitutional:      General: He is awake. He is not in acute distress.    Appearance: He is well-developed, well-groomed and overweight. He is not ill-appearing.  HENT:     Head: Normocephalic and atraumatic.     Right Ear: Hearing, tympanic membrane, ear canal and external ear normal. No drainage.     Left Ear: Hearing, tympanic membrane, ear canal and external ear normal. No drainage.     Nose: Nose normal.     Mouth/Throat:     Pharynx: Uvula midline.  Eyes:     General: Lids are normal.         Right eye: No discharge.        Left eye: No discharge.     Extraocular Movements: Extraocular movements intact.     Conjunctiva/sclera: Conjunctivae normal.     Pupils: Pupils are equal, round, and reactive to light.     Visual Fields: Right eye visual fields normal and left eye visual fields normal.  Neck:     Thyroid: No thyromegaly.     Vascular: No carotid bruit or JVD.     Trachea: Trachea normal.  Cardiovascular:     Rate and Rhythm: Normal rate and regular rhythm.     Heart sounds: Normal heart sounds, S1 normal and S2 normal. No murmur heard.   No gallop.  Pulmonary:     Effort: Pulmonary effort is normal. No accessory muscle usage or respiratory distress.     Breath sounds: Normal breath sounds.  Abdominal:     General: Bowel sounds are normal.     Palpations: Abdomen is soft. There is no hepatomegaly or splenomegaly.     Tenderness: There is no abdominal tenderness.  Musculoskeletal:        General: Normal range of motion.     Cervical back: Normal range of motion and neck supple.     Right lower leg: No edema.     Left lower leg: No edema.  Lymphadenopathy:     Head:  Right side of head: No submental, submandibular, tonsillar, preauricular or posterior auricular adenopathy.     Left side of head: No submental, submandibular, tonsillar, preauricular or posterior auricular adenopathy.     Cervical: No cervical adenopathy.  Skin:    General: Skin is warm and dry.     Capillary Refill: Capillary refill takes less than 2 seconds.     Findings: No rash.  Neurological:     Mental Status: He is alert and oriented to person, place, and time.     Cranial Nerves: Cranial nerves are intact.     Gait: Gait is intact.     Deep Tendon Reflexes: Reflexes are normal and symmetric.     Reflex Scores:      Brachioradialis reflexes are 2+ on the right side and 2+ on the left side.      Patellar reflexes are 2+ on the right side and 2+ on the left side. Psychiatric:         Attention and Perception: Attention normal.        Mood and Affect: Mood normal.        Speech: Speech normal.        Behavior: Behavior normal. Behavior is cooperative.        Thought Content: Thought content normal.        Cognition and Memory: Cognition normal.        Judgment: Judgment normal.   6CIT Screen 10/03/2020 12/04/2019 09/06/2019 11/17/2017  What Year? 0 points 0 points 0 points 0 points  What month? 0 points 0 points 0 points 0 points  What time? 0 points 0 points 0 points 0 points  Count back from 20 0 points 0 points 0 points 0 points  Months in reverse 0 points 0 points 0 points 0 points  Repeat phrase 0 points 0 points 0 points 0 points  Total Score 0 0 0 0    Results for orders placed or performed in visit on 06/27/20  Bayer DCA Hb A1c Waived  Result Value Ref Range   HB A1C (BAYER DCA - WAIVED) 6.9 <7.0 %  Microalbumin, Urine Waived  Result Value Ref Range   Microalb, Ur Waived 30 (H) 0 - 19 mg/L   Creatinine, Urine Waived 100 10 - 300 mg/dL   Microalb/Creat Ratio 30-300 (H) <30 mg/g  Comprehensive metabolic panel  Result Value Ref Range   Glucose 159 (H) 65 - 99 mg/dL   BUN 20 8 - 27 mg/dL   Creatinine, Ser 1.02 0.76 - 1.27 mg/dL   eGFR 80 >59 mL/min/1.73   BUN/Creatinine Ratio 20 10 - 24   Sodium 140 134 - 144 mmol/L   Potassium 4.5 3.5 - 5.2 mmol/L   Chloride 100 96 - 106 mmol/L   CO2 22 20 - 29 mmol/L   Calcium 10.0 8.6 - 10.2 mg/dL   Total Protein 7.3 6.0 - 8.5 g/dL   Albumin 5.0 (H) 3.8 - 4.8 g/dL   Globulin, Total 2.3 1.5 - 4.5 g/dL   Albumin/Globulin Ratio 2.2 1.2 - 2.2   Bilirubin Total 0.5 0.0 - 1.2 mg/dL   Alkaline Phosphatase 94 44 - 121 IU/L   AST 22 0 - 40 IU/L   ALT 19 0 - 44 IU/L  Lipid Panel w/o Chol/HDL Ratio  Result Value Ref Range   Cholesterol, Total 113 100 - 199 mg/dL   Triglycerides 100 0 - 149 mg/dL   HDL 43 >39 mg/dL   VLDL Cholesterol Cal 19 5 -  40 mg/dL   LDL Chol Calc (NIH) 51 0 - 99 mg/dL      Assessment & Plan:    Problem List Items Addressed This Visit       Cardiovascular and Mediastinum   Hypertension associated with diabetes ([place])    Chronic, ongoing with BP at goal in office and on home checks. Continue Lisinopril 20 MG and Amlodipine 5 MG daily -- refills sent in. Have advised he continue using Meloxicam minimally and discussed risks with consistent use.  Monitor BP at home daily and focus on DASH diet.  Labs today: CMP, TSH.   Return in 3 months for T2DM + HTN/HLD.         Relevant Medications   amLODipine (NORVASC) 5 MG tablet   lisinopril (ZESTRIL) 20 MG tablet   metFORMIN (GLUCOPHAGE) 500 MG tablet   rosuvastatin (CRESTOR) 20 MG tablet   Other Relevant Orders   Bayer DCA Hb A1c Waived   CBC with Differential/Platelet   TSH   Erectile dysfunction due to arterial insufficiency    Stable without medication.  Continue good control T2DM and HTN.  Check PSA today.  Consider testosterone check next visit as does endorse years of fatigue.       Relevant Medications   amLODipine (NORVASC) 5 MG tablet   lisinopril (ZESTRIL) 20 MG tablet   rosuvastatin (CRESTOR) 20 MG tablet   Other Relevant Orders   PSA     Digestive   GERD with stricture    Chronic, stable without medication.  Uses Magnesium BID, check level today and adjust regimen as needed.       Relevant Orders   Magnesium     Endocrine   Type 2 diabetes mellitus with diabetic neuropathy, without long-term current use of insulin ([facility]) - Primary    Chronic, ongoing.  A1C 6.7% today, stable, trend down.  Urine ALB 30 and A:C 30-300 in March 2022, will continue Lisinopril for kidney protection.  Continue current medication regimen and adjust as needed.  Recommend continued focus on daily exercise, which he has started again, and healthy diet.  Return in 3 months for T2DM.  Could consider addition of Farxiga or Jardiance if elevations in future.       Relevant Medications   lisinopril (ZESTRIL) 20 MG tablet   metFORMIN  (GLUCOPHAGE) 500 MG tablet   rosuvastatin (CRESTOR) 20 MG tablet   Other Relevant Orders   Bayer DCA Hb A1c Waived   Hyperlipidemia associated with type 2 diabetes mellitus (HCC)    Chronic, ongoing.  Continue current medication regimen and adjust as needed.  Lipid panel today, recent LDL below goal.       Relevant Medications   lisinopril (ZESTRIL) 20 MG tablet   metFORMIN (GLUCOPHAGE) 500 MG tablet   rosuvastatin (CRESTOR) 20 MG tablet   Other Relevant Orders   Bayer DCA Hb A1c Waived   Comprehensive metabolic panel   Lipid Panel w/o Chol/HDL Ratio     Other   History of smoking    Quit 23 years ago and has continued to not return to smoking.  Praised for continued cessation. Has had AA screening in past.       BMI 28.0-28.9,adult    Recommended eating smaller high protein, low fat meals more frequently and exercising 30 mins a day 5 times a week with a goal of 10-15lb weight loss in the next 3 months. Patient voiced their understanding and motivation to adhere to these recommendations.  Other Visit Diagnoses     Annual physical exam       Annual physical exam today and health maintenance reviewed -- all vaccines are refused by this patient due to personal beliefs.        Discussed aspirin prophylaxis for myocardial infarction prevention and decision was made to continue ASA  LABORATORY TESTING:  Health maintenance labs ordered today as discussed above.   The natural history of prostate cancer and ongoing controversy regarding screening and potential treatment outcomes of prostate cancer has been discussed with the patient. The meaning of a false positive PSA and a false negative PSA has been discussed. He indicates understanding of the limitations of this screening test and wishes to proceed with screening PSA testing.   IMMUNIZATIONS:  -- refuses flu and all PNA shots, does not believe in these -- including Shingrix and Covid, have had multiple conversations  on this - Tdap: Tetanus vaccination status reviewed: last tetanus booster within 10 years. - Influenza: REFUSES - Pneumovax: REFUSES - Prevnar: Refused - Zostavax vaccine: Refused  SCREENING: - Colonoscopy: Up to date  Discussed with patient purpose of the colonoscopy is to detect colon cancer at curable precancerous or early stages   - AAA Screening: 02/02/2017 no aneurysm -- under media -Hearing Test: Not applicable  -Spirometry: Not applicable   PATIENT COUNSELING:    Sexuality: Discussed sexually transmitted diseases, partner selection, use of condoms, avoidance of unintended pregnancy  and contraceptive alternatives.   Advised to avoid cigarette smoking.  I discussed with the patient that most people either abstain from alcohol or drink within safe limits (<=14/week and <=4 drinks/occasion for males, <=7/weeks and <= 3 drinks/occasion for females) and that the risk for alcohol disorders and other health effects rises proportionally with the number of drinks per week and how often a drinker exceeds daily limits.  Discussed cessation/primary prevention of drug use and availability of treatment for abuse.   Diet: Encouraged to adjust caloric intake to maintain  or achieve ideal body weight, to reduce intake of dietary saturated fat and total fat, to limit sodium intake by avoiding high sodium foods and not adding table salt, and to maintain adequate dietary potassium and calcium preferably from fresh fruits, vegetables, and low-fat dairy products.    Stressed the importance of regular exercise  Injury prevention: Discussed safety belts, safety helmets, smoke detector, smoking near bedding or upholstery.   Dental health: Discussed importance of regular tooth brushing, flossing, and dental visits.   Follow up plan: NEXT PREVENTATIVE PHYSICAL DUE IN 1 YEAR. Return in about 3 months (around 01/03/2021) for T2DM, HTN/HLD.

## 2020-10-03 NOTE — Assessment & Plan Note (Signed)
Chronic, ongoing.  A1C 6.7% today, stable, trend down.  Urine ALB 30 and A:C 30-300 in March 2022, will continue Lisinopril for kidney protection.  Continue current medication regimen and adjust as needed.  Recommend continued focus on daily exercise, which he has started again, and healthy diet.  Return in 3 months for T2DM.  Could consider addition of Farxiga or Jardiance if elevations in future.

## 2020-10-03 NOTE — Telephone Encounter (Signed)
Form placed in providers bin

## 2020-10-03 NOTE — Assessment & Plan Note (Signed)
Quit 23 years ago and has continued to not return to smoking.  Praised for continued cessation. Has had AA screening in past.

## 2020-10-03 NOTE — Assessment & Plan Note (Signed)
Stable without medication.  Continue good control T2DM and HTN.  Check PSA today.  Consider testosterone check next visit as does endorse years of fatigue.

## 2020-10-03 NOTE — Patient Instructions (Signed)
Diabetes Mellitus and Nutrition, Adult When you have diabetes, or diabetes mellitus, it is very important to have healthy eating habits because your blood sugar (glucose) levels are greatly affected by what you eat and drink. Eating healthy foods in the right amounts, at about the same times every day, can help you:  Control your blood glucose.  Lower your risk of heart disease.  Improve your blood pressure.  Reach or maintain a healthy weight. What can affect my meal plan? Every person with diabetes is different, and each person has different needs for a meal plan. Your health care provider may recommend that you work with a dietitian to make a meal plan that is best for you. Your meal plan may vary depending on factors such as:  The calories you need.  The medicines you take.  Your weight.  Your blood glucose, blood pressure, and cholesterol levels.  Your activity level.  Other health conditions you have, such as heart or kidney disease. How do carbohydrates affect me? Carbohydrates, also called carbs, affect your blood glucose level more than any other type of food. Eating carbs naturally raises the amount of glucose in your blood. Carb counting is a method for keeping track of how many carbs you eat. Counting carbs is important to keep your blood glucose at a healthy level, especially if you use insulin or take certain oral diabetes medicines. It is important to know how many carbs you can safely have in each meal. This is different for every person. Your dietitian can help you calculate how many carbs you should have at each meal and for each snack. How does alcohol affect me? Alcohol can cause a sudden decrease in blood glucose (hypoglycemia), especially if you use insulin or take certain oral diabetes medicines. Hypoglycemia can be a life-threatening condition. Symptoms of hypoglycemia, such as sleepiness, dizziness, and confusion, are similar to symptoms of having too much  alcohol.  Do not drink alcohol if: ? Your health care provider tells you not to drink. ? You are pregnant, may be pregnant, or are planning to become pregnant.  If you drink alcohol: ? Do not drink on an empty stomach. ? Limit how much you use to:  0-1 drink a day for women.  0-2 drinks a day for men. ? Be aware of how much alcohol is in your drink. In the [place] one drink equals one 12 oz bottle of beer (355 mL), one 5 oz glass of wine (148 mL), or one 1 oz glass of hard liquor (44 mL). ? Keep yourself hydrated with water, diet soda, or unsweetened iced tea.  Keep in mind that regular soda, juice, and other mixers may contain a lot of sugar and must be counted as carbs. What are tips for following this plan? Reading food labels  Start by checking the serving size on the "Nutrition Facts" label of packaged foods and drinks. The amount of calories, carbs, fats, and other nutrients listed on the label is based on one serving of the item. Many items contain more than one serving per package.  Check the total grams (g) of carbs in one serving. You can calculate the number of servings of carbs in one serving by dividing the total carbs by 15. For example, if a food has 30 g of total carbs per serving, it would be equal to 2 servings of carbs.  Check the number of grams (g) of saturated fats and trans fats in one serving. Choose foods that have   a low amount or none of these fats.  Check the number of milligrams (mg) of salt (sodium) in one serving. Most people should limit total sodium intake to less than 2,300 mg per day.  Always check the nutrition information of foods labeled as "low-fat" or "nonfat." These foods may be higher in added sugar or refined carbs and should be avoided.  Talk to your dietitian to identify your daily goals for nutrients listed on the label. Shopping  Avoid buying canned, pre-made, or processed foods. These foods tend to be high in fat, sodium, and added  sugar.  Shop around the outside edge of the grocery store. This is where you will most often find fresh fruits and vegetables, bulk grains, fresh meats, and fresh dairy. Cooking  Use low-heat cooking methods, such as baking, instead of high-heat cooking methods like deep frying.  Cook using healthy oils, such as olive, canola, or sunflower oil.  Avoid cooking with butter, cream, or high-fat meats. Meal planning  Eat meals and snacks regularly, preferably at the same times every day. Avoid going long periods of time without eating.  Eat foods that are high in fiber, such as fresh fruits, vegetables, beans, and whole grains. Talk with your dietitian about how many servings of carbs you can eat at each meal.  Eat 4-6 oz (112-168 g) of lean protein each day, such as lean meat, chicken, fish, eggs, or tofu. One ounce (oz) of lean protein is equal to: ? 1 oz (28 g) of meat, chicken, or fish. ? 1 egg. ?  cup (62 g) of tofu.  Eat some foods each day that contain healthy fats, such as avocado, nuts, seeds, and fish.   What foods should I eat? Fruits Berries. Apples. Oranges. Peaches. Apricots. Plums. Grapes. Mango. Papaya. Pomegranate. Kiwi. Cherries. Vegetables Lettuce. Spinach. Leafy greens, including kale, chard, collard greens, and mustard greens. Beets. Cauliflower. Cabbage. Broccoli. Carrots. Green beans. Tomatoes. Peppers. Onions. Cucumbers. Brussels sprouts. Grains Whole grains, such as whole-wheat or whole-grain bread, crackers, tortillas, cereal, and pasta. Unsweetened oatmeal. Quinoa. Brown or wild rice. Meats and other proteins Seafood. Poultry without skin. Lean cuts of poultry and beef. Tofu. Nuts. Seeds. Dairy Low-fat or fat-free dairy products such as milk, yogurt, and cheese. The items listed above may not be a complete list of foods and beverages you can eat. Contact a dietitian for more information. What foods should I avoid? Fruits Fruits canned with  syrup. Vegetables Canned vegetables. Frozen vegetables with butter or cream sauce. Grains Refined white flour and flour products such as bread, pasta, snack foods, and cereals. Avoid all processed foods. Meats and other proteins Fatty cuts of meat. Poultry with skin. Breaded or fried meats. Processed meat. Avoid saturated fats. Dairy Full-fat yogurt, cheese, or milk. Beverages Sweetened drinks, such as soda or iced tea. The items listed above may not be a complete list of foods and beverages you should avoid. Contact a dietitian for more information. Questions to ask a health care provider  Do I need to meet with a diabetes educator?  Do I need to meet with a dietitian?  What number can I call if I have questions?  When are the best times to check my blood glucose? Where to find more information:  American Diabetes Association: diabetes.org  Academy of Nutrition and Dietetics: www.eatright.org  National Institute of Diabetes and Digestive and Kidney Diseases: www.niddk.nih.gov  Association of Diabetes Care and Education Specialists: www.diabeteseducator.org Summary  It is important to have healthy eating   habits because your blood sugar (glucose) levels are greatly affected by what you eat and drink.  A healthy meal plan will help you control your blood glucose and maintain a healthy lifestyle.  Your health care provider may recommend that you work with a dietitian to make a meal plan that is best for you.  Keep in mind that carbohydrates (carbs) and alcohol have immediate effects on your blood glucose levels. It is important to count carbs and to use alcohol carefully. This information is not intended to replace advice given to you by your health care provider. Make sure you discuss any questions you have with your health care provider. Document Revised: 03/07/2019 Document Reviewed: 03/07/2019 Elsevier Patient Education  2021 [employer]  

## 2020-10-03 NOTE — Telephone Encounter (Signed)
Pt. Brought in form for Shoes to be filled out by [employer]. Placed in BIN for review Thx

## 2020-10-03 NOTE — Assessment & Plan Note (Signed)
Recommended eating smaller high protein, low fat meals more frequently and exercising 30 mins a day 5 times a week with a goal of 10-15lb weight loss in the next 3 months. Patient voiced their understanding and motivation to adhere to these recommendations.  

## 2020-10-03 NOTE — Assessment & Plan Note (Signed)
Chronic, ongoing.  Continue current medication regimen and adjust as needed.  Lipid panel today, recent LDL below goal. 

## 2020-10-04 LAB — TSH: TSH: 1.38 u[IU]/mL (ref 0.450–4.500)

## 2020-10-04 LAB — COMPREHENSIVE METABOLIC PANEL
ALT: 19 IU/L (ref 0–44)
AST: 18 IU/L (ref 0–40)
Albumin/Globulin Ratio: 2.4 — ABNORMAL HIGH (ref 1.2–2.2)
Albumin: 4.6 g/dL (ref 3.8–4.8)
Alkaline Phosphatase: 83 IU/L (ref 44–121)
BUN/Creatinine Ratio: 15 (ref 10–24)
BUN: 14 mg/dL (ref 8–27)
Bilirubin Total: 0.7 mg/dL (ref 0.0–1.2)
CO2: 23 mmol/L (ref 20–29)
Calcium: 9.8 mg/dL (ref 8.6–10.2)
Chloride: 100 mmol/L (ref 96–106)
Creatinine, Ser: 0.95 mg/dL (ref 0.76–1.27)
Globulin, Total: 1.9 g/dL (ref 1.5–4.5)
Glucose: 158 mg/dL — ABNORMAL HIGH (ref 65–99)
Potassium: 4.6 mmol/L (ref 3.5–5.2)
Sodium: 144 mmol/L (ref 134–144)
Total Protein: 6.5 g/dL (ref 6.0–8.5)
eGFR: 87 mL/min/{1.73_m2} (ref >59)

## 2020-10-04 LAB — CBC WITH DIFFERENTIAL/PLATELET
Basophils Absolute: 0.1 10*3/uL (ref 0.0–0.2)
Basos: 1 %
EOS (ABSOLUTE): 0.2 10*3/uL (ref 0.0–0.4)
Eos: 4 %
Hematocrit: 43.8 % (ref 37.5–51.0)
Hemoglobin: 15.1 g/dL (ref 13.0–17.7)
Immature Grans (Abs): 0 10*3/uL (ref 0.0–0.1)
Immature Granulocytes: 0 %
Lymphocytes Absolute: 1.2 10*3/uL (ref 0.7–3.1)
Lymphs: 26 %
MCH: 31.6 pg (ref 26.6–33.0)
MCHC: 34.5 g/dL (ref 31.5–35.7)
MCV: 92 fL (ref 79–97)
Monocytes Absolute: 0.3 10*3/uL (ref 0.1–0.9)
Monocytes: 6 %
Neutrophils Absolute: 2.9 10*3/uL (ref 1.4–7.0)
Neutrophils: 63 %
Platelets: 205 10*3/uL (ref 150–450)
RBC: 4.78 x10E6/uL (ref 4.14–5.80)
RDW: 12.9 % (ref 11.6–15.4)
WBC: 4.6 10*3/uL (ref 3.4–10.8)

## 2020-10-04 LAB — LIPID PANEL W/O CHOL/HDL RATIO
Cholesterol, Total: 97 mg/dL — ABNORMAL LOW (ref 100–199)
HDL: 41 mg/dL (ref >39)
LDL Chol Calc (NIH): 38 mg/dL (ref 0–99)
Triglycerides: 96 mg/dL (ref 0–149)
VLDL Cholesterol Cal: 18 mg/dL (ref 5–40)

## 2020-10-04 LAB — PSA: Prostate Specific Ag, Serum: 0.3 ng/mL (ref 0.0–4.0)

## 2020-10-04 LAB — MAGNESIUM: Magnesium: 2.1 mg/dL (ref 1.6–2.3)

## 2020-10-04 NOTE — Telephone Encounter (Signed)
Noted  

## 2020-10-04 NOTE — Progress Notes (Signed)
Please let Lawrence Jensen know his labs continue to be stable, no medication changes needed.  Continue all current regimen.  He will need to make an appointment with Dr. Laural Benes to sign shoe form, as this requires and MD or DO signature unfortunately, can not be signed by an NP or PA.  Have a great day!! Keep being awesome!!  Thank you for allowing me to participate in your care.  I appreciate you. Kindest regards, Pansey Pinheiro

## 2020-10-04 NOTE — Telephone Encounter (Signed)
No worries for me to see him- I can see him whenever, OK to double book with a virtual, but I think he needs to be in person

## 2020-10-04 NOTE — Telephone Encounter (Signed)
Called pt to schedule appt. Pt was not happy about needing an appt and making a special trip advised to pt that in order to get what he needs he needs to have an appt with a Doctor. Pt states that he has brought in prescriptions before and if the provider wasn't there someone else was able to sign. Pt states to forget it.

## 2020-11-02 IMAGING — CR DG KNEE COMPLETE 4+V*R*
1 series · 4 of 4 positions shown · non-contrast
Comparison: None

CLINICAL DATA: Acute right knee pain for 3 months, suspected
osteoarthritis, intermittent pain for 6 months, anterior pain below
patella worse in last 2-3 weeks, no prior injury or surgery

EXAM:
RIGHT KNEE - COMPLETE 4+ VIEW

[Series 1: dg knee complete 4 views right · 0.14mm/px · 4 of 4 slices shown]
[im 1/4]
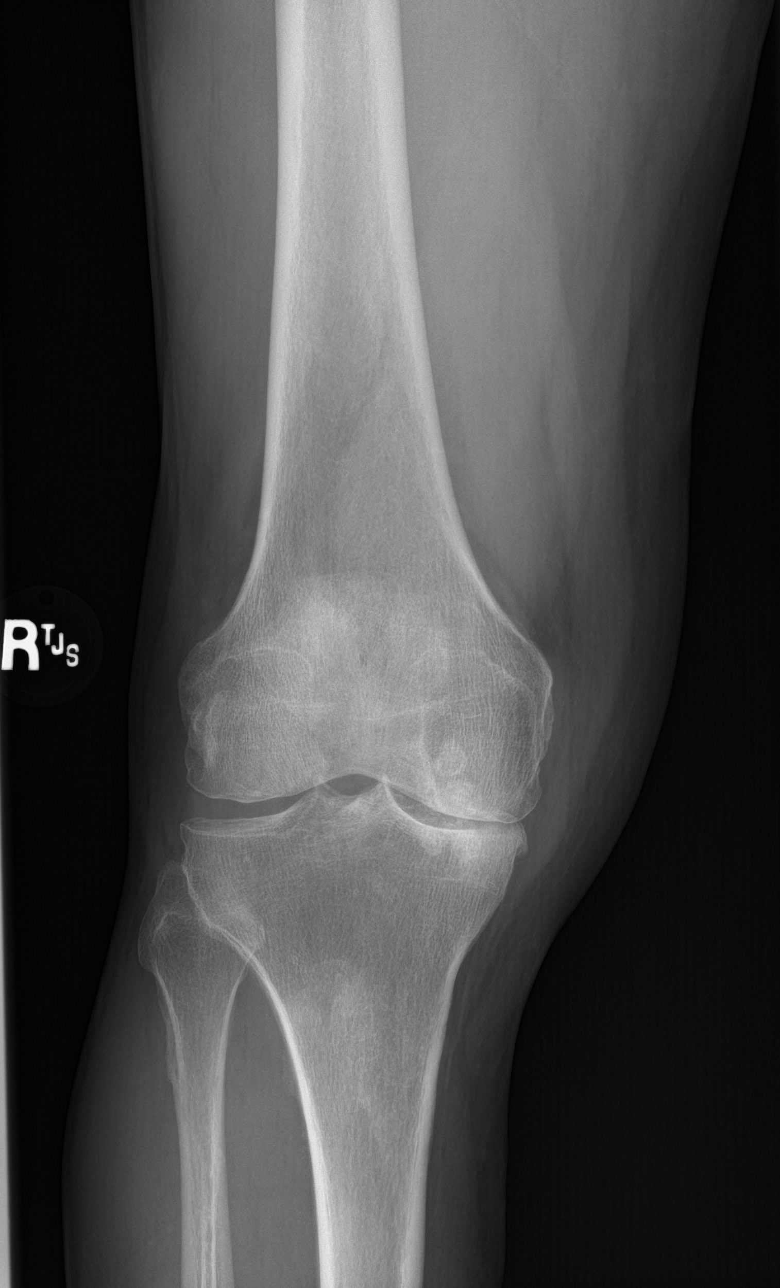
[im 2/4]
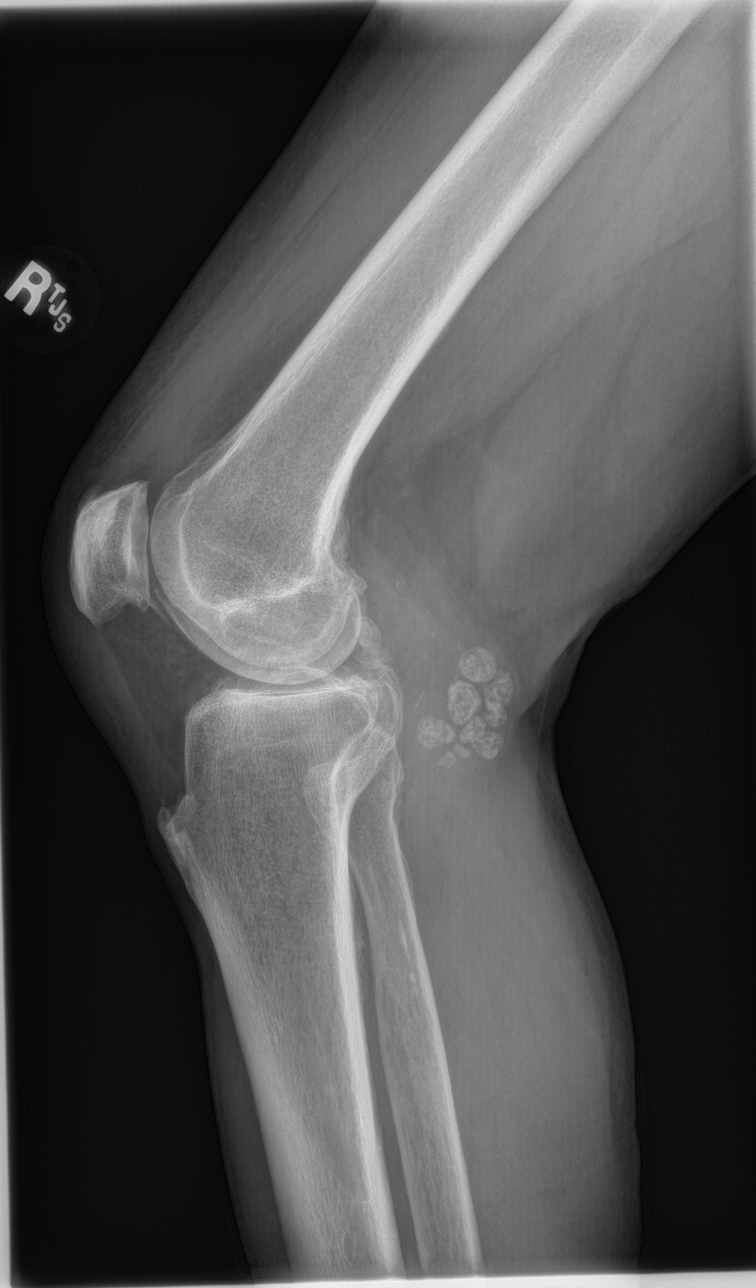
[im 3/4]
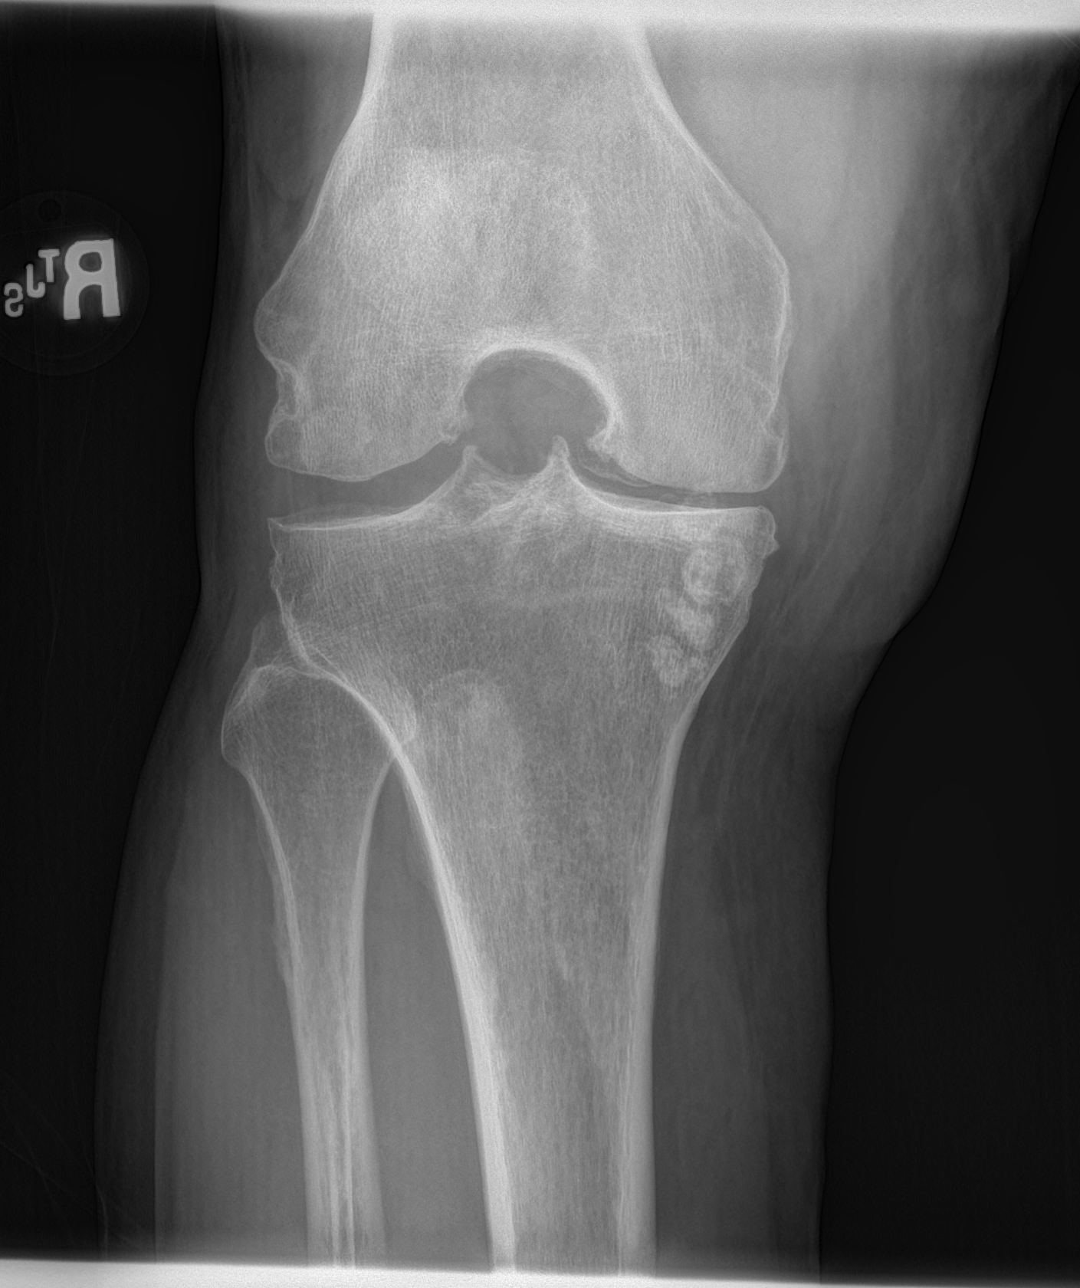
[im 4/4]
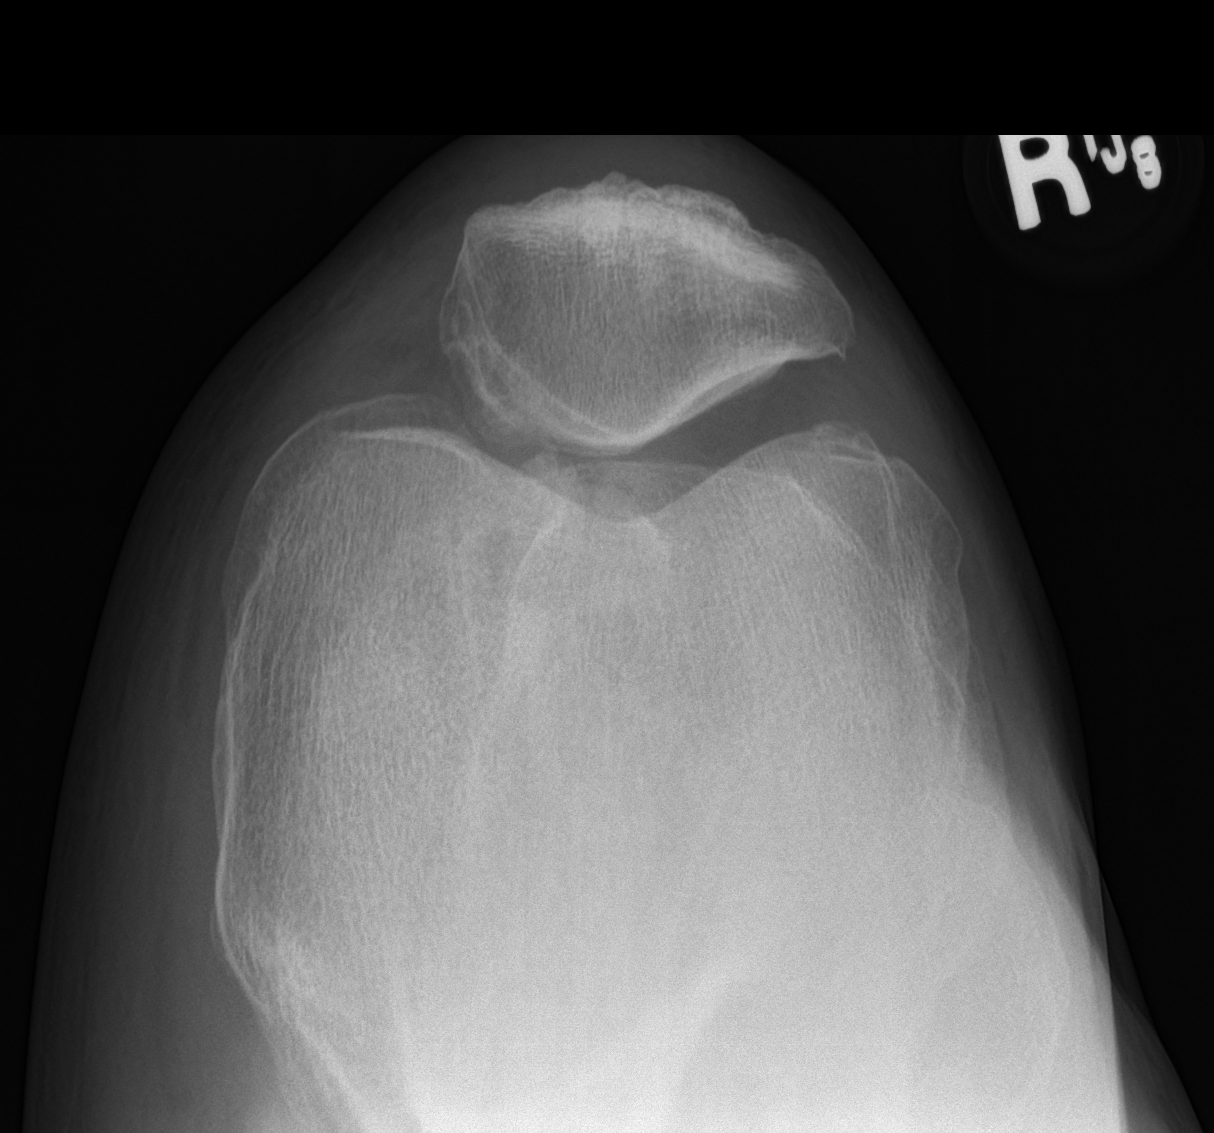

[4 of 4 positions shown; findings below may reference images not displayed]

FINDINGS: Osseous demineralization.

Joint space narrowing and minimal spur formation greatest at medial
compartment.

No acute fracture, dislocation, or bone destruction.

Multiple rounded calcified loose bodies are seen within the medial
aspect of the RIGHT popliteal fossa likely within a complex Baker
cyst.

Several additional calcified loose bodies are seen at the posterior
joint line on lateral view.

Minimal joint fluid.
IMPRESSION: Degenerative changes RIGHT knee with suspect multiple calcified
loose bodies primarily within a Baker cyst posterior to the medial
RIGHT knee.

## 2020-12-04 ENCOUNTER — Ambulatory Visit: Payer: Medicare Other

## 2020-12-06 ENCOUNTER — Ambulatory Visit (INDEPENDENT_AMBULATORY_CARE_PROVIDER_SITE_OTHER): Payer: Medicare Other

## 2020-12-06 VITALS — Ht 66.0 in | Wt 175.0 lb

## 2020-12-06 DIAGNOSIS — Z0000 Encounter for general adult medical examination without abnormal findings: Secondary | ICD-10-CM | POA: Diagnosis not present

## 2020-12-06 NOTE — Patient Instructions (Signed)
Lawrence Jensen , Thank you for taking time to come for your Medicare Wellness Visit. I appreciate your ongoing commitment to your health goals. Please review the following plan we discussed and let me know if I can assist you in the future.   Screening recommendations/referrals: Colonoscopy: cologuard 12/12/2018, due 12/11/2021 Recommended yearly ophthalmology/optometry visit for glaucoma screening and checkup Recommended yearly dental visit for hygiene and checkup  Vaccinations: Influenza vaccine: decline Pneumococcal vaccine: decline Tdap vaccine: completed 01/02/2014, due 01/03/2024 Shingles vaccine: decline   Covid-19: decline  Advanced directives: Advance directive discussed with you today.   Conditions/risks identified: none  Next appointment: Follow up in one year for your annual wellness visit.   Preventive Care 67 Years and Older, Male Preventive care refers to lifestyle choices and visits with your health care provider that can promote health and wellness. What does preventive care include? A yearly physical exam. This is also called an annual well check. Dental exams once or twice a year. Routine eye exams. Ask your health care provider how often you should have your eyes checked. Personal lifestyle choices, including: Daily care of your teeth and gums. Regular physical activity. Eating a healthy diet. Avoiding tobacco and drug use. Limiting alcohol use. Practicing safe sex. Taking low doses of aspirin every day. Taking vitamin and mineral supplements as recommended by your health care provider. What happens during an annual well check? The services and screenings done by your health care provider during your annual well check will depend on your age, overall health, lifestyle risk factors, and family history of disease. Counseling  Your health care provider may ask you questions about your: Alcohol use. Tobacco use. Drug use. Emotional well-being. Home and  relationship well-being. Sexual activity. Eating habits. History of falls. Memory and ability to understand (cognition). Work and work Astronomer. Screening  You may have the following tests or measurements: Height, weight, and BMI. Blood pressure. Lipid and cholesterol levels. These may be checked every 5 years, or more frequently if you are over 14 years old. Skin check. Lung cancer screening. You may have this screening every year starting at age 62 if you have a 30-pack-year history of smoking and currently smoke or have quit within the past 15 years. Fecal occult blood test (FOBT) of the stool. You may have this test every year starting at age 69. Flexible sigmoidoscopy or colonoscopy. You may have a sigmoidoscopy every 5 years or a colonoscopy every 10 years starting at age 70. Prostate cancer screening. Recommendations will vary depending on your family history and other risks. Hepatitis C blood test. Hepatitis B blood test. Sexually transmitted disease (STD) testing. Diabetes screening. This is done by checking your blood sugar (glucose) after you have not eaten for a while (fasting). You may have this done every 1-3 years. Abdominal aortic aneurysm (AAA) screening. You may need this if you are a current or former smoker. Osteoporosis. You may be screened starting at age 54 if you are at high risk. Talk with your health care provider about your test results, treatment options, and if necessary, the need for more tests. Vaccines  Your health care provider may recommend certain vaccines, such as: Influenza vaccine. This is recommended every year. Tetanus, diphtheria, and acellular pertussis (Tdap, Td) vaccine. You may need a Td booster every 10 years. Zoster vaccine. You may need this after age 58. Pneumococcal 13-valent conjugate (PCV13) vaccine. One dose is recommended after age 71. Pneumococcal polysaccharide (PPSV23) vaccine. One dose is recommended after age 62.  Talk to your  health care provider about which screenings and vaccines you need and how often you need them. This information is not intended to replace advice given to you by your health care provider. Make sure you discuss any questions you have with your health care provider. Document Released: 04/26/2015 Document Revised: 12/18/2015 Document Reviewed: 01/29/2015 Elsevier Interactive Patient Education  2017 [employer] Prevention in the Home Falls can cause injuries. They can happen to people of all ages. There are many things you can do to make your home safe and to help prevent falls. What can I do on the outside of my home? Regularly fix the edges of walkways and driveways and fix any cracks. Remove anything that might make you trip as you walk through a door, such as a raised step or threshold. Trim any bushes or trees on the path to your home. Use bright outdoor lighting. Clear any walking paths of anything that might make someone trip, such as rocks or tools. Regularly check to see if handrails are loose or broken. Make sure that both sides of any steps have handrails. Any raised decks and porches should have guardrails on the edges. Have any leaves, snow, or ice cleared regularly. Use sand or salt on walking paths during winter. Clean up any spills in your garage right away. This includes oil or grease spills. What can I do in the bathroom? Use night lights. Install grab bars by the toilet and in the tub and shower. Do not use towel bars as grab bars. Use non-skid mats or decals in the tub or shower. If you need to sit down in the shower, use a plastic, non-slip stool. Keep the floor dry. Clean up any water that spills on the floor as soon as it happens. Remove soap buildup in the tub or shower regularly. Attach bath mats securely with double-sided non-slip rug tape. Do not have throw rugs and other things on the floor that can make you trip. What can I do in the bedroom? Use night  lights. Make sure that you have a light by your bed that is easy to reach. Do not use any sheets or blankets that are too big for your bed. They should not hang down onto the floor. Have a firm chair that has side arms. You can use this for support while you get dressed. Do not have throw rugs and other things on the floor that can make you trip. What can I do in the kitchen? Clean up any spills right away. Avoid walking on wet floors. Keep items that you use a lot in easy-to-reach places. If you need to reach something above you, use a strong step stool that has a grab bar. Keep electrical cords out of the way. Do not use floor polish or wax that makes floors slippery. If you must use wax, use non-skid floor wax. Do not have throw rugs and other things on the floor that can make you trip. What can I do with my stairs? Do not leave any items on the stairs. Make sure that there are handrails on both sides of the stairs and use them. Fix handrails that are broken or loose. Make sure that handrails are as long as the stairways. Check any carpeting to make sure that it is firmly attached to the stairs. Fix any carpet that is loose or worn. Avoid having throw rugs at the top or bottom of the stairs. If you do have throw  rugs, attach them to the floor with carpet tape. Make sure that you have a light switch at the top of the stairs and the bottom of the stairs. If you do not have them, ask someone to add them for you. What else can I do to help prevent falls? Wear shoes that: Do not have high heels. Have rubber bottoms. Are comfortable and fit you well. Are closed at the toe. Do not wear sandals. If you use a stepladder: Make sure that it is fully opened. Do not climb a closed stepladder. Make sure that both sides of the stepladder are locked into place. Ask someone to hold it for you, if possible. Clearly mark and make sure that you can see: Any grab bars or handrails. First and last  steps. Where the edge of each step is. Use tools that help you move around (mobility aids) if they are needed. These include: Canes. Walkers. Scooters. Crutches. Turn on the lights when you go into a dark area. Replace any light bulbs as soon as they burn out. Set up your furniture so you have a clear path. Avoid moving your furniture around. If any of your floors are uneven, fix them. If there are any pets around you, be aware of where they are. Review your medicines with your doctor. Some medicines can make you feel dizzy. This can increase your chance of falling. Ask your doctor what other things that you can do to help prevent falls. This information is not intended to replace advice given to you by your health care provider. Make sure you discuss any questions you have with your health care provider. Document Released: 01/24/2009 Document Revised: 09/05/2015 Document Reviewed: 05/04/2014 Elsevier Interactive Patient Education  2017 [employer].

## 2020-12-06 NOTE — Progress Notes (Signed)
I connected with Lawrence Jensen today by telephone and verified that I am speaking with the correct person using two identifiers. Location patient: home Location provider: work Persons participating in the virtual visit: Braydyn Schultes, [name].   I discussed the limitations, risks, security and privacy concerns of performing an evaluation and management service by telephone and the availability of in person appointments. I also discussed with the patient that there may be a patient responsible charge related to this service. The patient expressed understanding and verbally consented to this telephonic visit.    Interactive audio and video telecommunications were attempted between this provider and patient, however failed, due to patient having technical difficulties OR patient did not have access to video capability.  We continued and completed visit with audio only.     Vital signs may be patient reported or missing.  Subjective:   Lawrence Jensen is a 70 y.o. male who presents for Medicare Annual/Subsequent preventive examination.  Review of Systems     Cardiac Risk Factors include: advanced age (>93mn, >>63women);diabetes mellitus;dyslipidemia;hypertension;male gender     Objective:    Today's Vitals   12/06/20 1257  Weight: 175 lb (79.4 kg)  Height: 5' 6"  (1.676 m)   Body mass index is 28.25 kg/m.  Advanced Directives 12/06/2020 12/04/2019 11/28/2018 02/04/2018 11/17/2017 02/02/2017 11/02/2016  Does Patient Have a Medical Advance Directive? No No Yes No No No No  Would patient like information on creating a medical advance directive? - - - No - Patient declined Yes (MAU/Ambulatory/Procedural Areas - Information given) - No - Patient declined    Current Medications (verified) Outpatient Encounter Medications as of 12/06/2020  Medication Sig   ACCU-CHEK AVIVA PLUS test strip USE AS INSTRUCTED   amLODipine (NORVASC) 5 MG tablet Take 1 tablet (5 mg total) by mouth  daily.   aspirin EC 81 MG tablet Take 81 mg by mouth daily.   Blood Glucose Calibration (OT ULTRA/FASTTK CNTRL SOLN) SOLN    Blood Glucose Monitoring Suppl (ONE TOUCH ULTRA 2) w/Device KIT Inject 1 kit into the skin 3 (three) times daily.   COD LIVER OIL PO Take 3 tablets by mouth daily.   lisinopril (ZESTRIL) 20 MG tablet Take 1 tablet (20 mg total) by mouth daily.   Magnesium 400 MG TABS Take 400 mg by mouth 2 (two) times daily.   meloxicam (MOBIC) 15 MG tablet Take 1 tablet (15 mg total) by mouth daily.   metFORMIN (GLUCOPHAGE) 500 MG tablet Take 2 tablets (1,000 mg total) by mouth 2 (two) times daily with a meal.   rosuvastatin (CRESTOR) 20 MG tablet Take 1 tablet (20 mg total) by mouth daily.   No facility-administered encounter medications on file as of 12/06/2020.    Allergies (verified) Patient has no known allergies.   History: Past Medical History:  Diagnosis Date   Diabetes mellitus without complication ([facility]    ED (erectile dysfunction)    Hyperlipidemia    Hypertension    Past Surgical History:  Procedure Laterality Date   APPENDECTOMY  1999   Family History  Problem Relation Age of Onset   Heart disease Mother    Heart disease Father    Congestive Heart Failure Father    Cancer Father        prostate   Diabetes Father    Stroke Father    Hypertension Father    Cancer Brother    Stroke Maternal Grandmother    Heart disease Brother    COPD  Neg Hx    Social History   Socioeconomic History   Marital status: Married    Spouse name: Not on file   Number of children: Not on file   Years of education: Not on file   Highest education level: Associate degree: academic program  Occupational History   Occupation: retired  Tobacco Use   Smoking status: Former    Packs/day: 1.00    Years: 35.00    Pack years: 35.00    Types: Cigarettes    Quit date: 04/03/1995    Years since quitting: 25.6   Smokeless tobacco: Never  Vaping Use   Vaping Use: Never used   Substance and Sexual Activity   Alcohol use: No   Drug use: No   Sexual activity: Not Currently  Other Topics Concern   Not on file  Social History Narrative   Not on file   Social Determinants of Health   Financial Resource Strain: Low Risk    Difficulty of Paying Living Expenses: Not hard at all  Food Insecurity: No Food Insecurity   Worried About Charity fundraiser in the Last Year: Never true   [place] in the Last Year: Never true  Transportation Needs: No Transportation Needs   Lack of Transportation (Medical): No   Lack of Transportation (Non-Medical): No  Physical Activity: Insufficiently Active   Days of Exercise per Week: 3 days   Minutes of Exercise per Session: 30 min  Stress: No Stress Concern Present   Feeling of Stress : Not at all  Social Connections: Socially Integrated   Frequency of Communication with Friends and Family: More than three times a week   Frequency of Social Gatherings with Friends and Family: More than three times a week   Attends Religious Services: 1 to 4 times per year   Active Member of [employer] or Organizations: Yes   Attends Archivist Meetings: 1 to 4 times per year   Marital Status: Married    Tobacco Counseling Counseling given: Not Answered   Clinical Intake:  Pre-visit preparation completed: Yes  Pain : No/denies pain     Nutritional Status: BMI 25 -29 Overweight Nutritional Risks: None Diabetes: Yes  How often do you need to have someone help you when you read instructions, pamphlets, or other written materials from your doctor or pharmacy?: 1 - Never What is the last grade level you completed in school?: 64yrs college  Diabetic? Yes Nutrition Risk Assessment:  Has the patient had any N/V/D within the last 2 months?  No  Does the patient have any non-healing wounds?  No  Has the patient had any unintentional weight loss or weight gain?  No   Diabetes:  Is the patient diabetic?  Yes  If diabetic,  was a CBG obtained today?  No  Did the patient bring in their glucometer from home?  No  How often do you monitor your CBG's? daily.   Financial Strains and Diabetes Management:  Are you having any financial strains with the device, your supplies or your medication? No .  Does the patient want to be seen by Chronic Care Management for management of their diabetes?  No  Would the patient like to be referred to a Nutritionist or for Diabetic Management?  No   Diabetic Exams:  Diabetic Eye Exam: Overdue for diabetic eye exam. Pt has been advised about the importance in completing this exam. Patient advised to call and schedule an eye exam. Diabetic Foot  Exam: Completed 03/28/2020   Interpreter Needed?: No  Information entered by :: NAllen LPN   Activities of Daily Living In your present state of health, do you have any difficulty performing the following activities: 12/06/2020 10/03/2020  Hearing? N N  Vision? N N  Difficulty concentrating or making decisions? N N  Walking or climbing stairs? N N  Dressing or bathing? N N  Doing errands, shopping? N N  Preparing Food and eating ? N -  Using the Toilet? N -  In the past six months, have you accidently leaked urine? N -  Do you have problems with loss of bowel control? N -  Managing your Medications? N -  Managing your Finances? N -  Housekeeping or managing your Housekeeping? N -  Some recent data might be hidden    Patient Care Team: Venita Lick, NP as PCP - General (Nurse Practitioner)  Indicate any recent Medical Services you may have received from other than [facility] providers in the past year (date may be approximate).     Assessment:   This is a routine wellness examination for [place].  Hearing/Vision screen Vision Screening - Comments:: No regular eye exams, [facility]  Dietary issues and exercise activities discussed: Current Exercise Habits: Home exercise routine, Type of exercise: walking, Time  (Minutes): 30, Frequency (Times/Week): 3, Weekly Exercise (Minutes/Week): 90   Goals Addressed             This Visit's Progress    Patient Stated       12/06/2020, stay healthy       Depression Screen PHQ 2/9 Scores 12/06/2020 10/03/2020 12/04/2019 09/06/2019 11/28/2018 11/17/2017 11/09/2017  PHQ - 2 Score 0 0 0 0 0 0 0  PHQ- 9 Score - - - - - 3 1    Fall Risk Fall Risk  12/06/2020 12/04/2019 09/06/2019 11/28/2018 11/17/2017  Falls in the past year? 0 0 0 0 No  Number falls in past yr: - - 0 - -  Injury with Fall? - - 0 - -  Risk for fall due to : Medication side effect Medication side effect - - -  Follow up Falls evaluation completed;Education provided;Falls prevention discussed Falls evaluation completed;Education provided;Falls prevention discussed Falls evaluation completed - -    FALL RISK PREVENTION PERTAINING TO THE HOME:  Any stairs in or around the home? Yes  If so, are there any without handrails? No  Home free of loose throw rugs in walkways, pet beds, electrical cords, etc? Yes  Adequate lighting in your home to reduce risk of falls? Yes   ASSISTIVE DEVICES UTILIZED TO PREVENT FALLS:  Life alert? No  Use of a cane, walker or w/c? No  Grab bars in the bathroom? No  Shower chair or bench in shower? No  Elevated toilet seat or a handicapped toilet? No   TIMED UP AND GO:  Was the test performed? No .      Cognitive Function:     6CIT Screen 12/06/2020 10/03/2020 12/04/2019 09/06/2019 11/17/2017  What Year? 0 points 0 points 0 points 0 points 0 points  What month? 0 points 0 points 0 points 0 points 0 points  What time? 0 points 0 points 0 points 0 points 0 points  Count back from 20 0 points 0 points 0 points 0 points 0 points  Months in reverse 0 points 0 points 0 points 0 points 0 points  Repeat phrase 4 points 0 points 0 points 0 points 0  points  Total Score 4 0 0 0 0    Immunizations Immunization History  Administered Date(s) Administered   Tdap 01/02/2014     TDAP status: Up to date  Flu Vaccine status: Declined, Education has been provided regarding the importance of this vaccine but patient still declined. Advised may receive this vaccine at local pharmacy or Health Dept. Aware to provide a copy of the vaccination record if obtained from local pharmacy or Health Dept. Verbalized acceptance and understanding.  Pneumococcal vaccine status: Declined,  Education has been provided regarding the importance of this vaccine but patient still declined. Advised may receive this vaccine at local pharmacy or Health Dept. Aware to provide a copy of the vaccination record if obtained from local pharmacy or Health Dept. Verbalized acceptance and understanding.   Covid-19 vaccine status: Declined, Education has been provided regarding the importance of this vaccine but patient still declined. Advised may receive this vaccine at local pharmacy or Health Dept.or vaccine clinic. Aware to provide a copy of the vaccination record if obtained from local pharmacy or Health Dept. Verbalized acceptance and understanding.  Qualifies for Shingles Vaccine? Yes   Zostavax completed No   Shingrix Completed?: No.    Education has been provided regarding the importance of this vaccine. Patient has been advised to call insurance company to determine out of pocket expense if they have not yet received this vaccine. Advised may also receive vaccine at local pharmacy or Health Dept. Verbalized acceptance and understanding.  Screening Tests Health Maintenance  Topic Date Due   OPHTHALMOLOGY EXAM  04/16/2019   COVID-19 Vaccine (1) 12/22/2020 (Originally 07/21/1955)   Zoster Vaccines- Shingrix (1 of 2) 01/03/2021 (Originally 07/21/2001)   INFLUENZA VACCINE  07/11/2021 (Originally 11/11/2020)   PNA vac Low Risk Adult (1 of 2 - PCV13) 12/06/2021 (Originally 07/21/2016)   FOOT EXAM  03/28/2021   HEMOGLOBIN A1C  04/04/2021   Fecal DNA (Cologuard)  12/05/2021   TETANUS/TDAP  01/03/2024    Hepatitis C Screening  Completed   HPV VACCINES  Aged Out    Health Maintenance  Health Maintenance Due  Topic Date Due   OPHTHALMOLOGY EXAM  04/16/2019    Colorectal cancer screening: Type of screening: Cologuard. Completed 12/12/2018. Repeat every 3 years  Lung Cancer Screening: (Low Dose CT Chest recommended if Age 12-80 years, 30 pack-year currently smoking OR have quit w/in 15years.) does not qualify.   Lung Cancer Screening Referral: no  Additional Screening:  Hepatitis C Screening: does qualify; Completed 08/24/2018  Vision Screening: Recommended annual ophthalmology exams for early detection of glaucoma and other disorders of the eye. Is the patient up to date with their annual eye exam?  No  Who is the provider or what is the name of the office in which the patient attends annual eye exams? [facility] If pt is not established with a provider, would they like to be referred to a provider to establish care? No .   Dental Screening: Recommended annual dental exams for proper oral hygiene  Community Resource Referral / Chronic Care Management: CRR required this visit?  No   CCM required this visit?  No      Plan:     I have personally reviewed and noted the following in the patient's chart:   Medical and social history Use of alcohol, tobacco or illicit drugs  Current medications and supplements including opioid prescriptions. Patient is not currently taking opioid prescriptions. Functional ability and status Nutritional status Physical activity Advanced directives  List of other physicians Hospitalizations, surgeries, and ER visits in previous 12 months Vitals Screenings to include cognitive, depression, and falls Referrals and appointments  In addition, I have reviewed and discussed with patient certain preventive protocols, quality metrics, and best practice recommendations. A written personalized care plan for preventive services as well as general  preventive health recommendations were provided to patient.     Kellie Simmering, LPN   [id number]   Nurse Notes:

## 2021-01-02 ENCOUNTER — Ambulatory Visit (INDEPENDENT_AMBULATORY_CARE_PROVIDER_SITE_OTHER): Payer: Medicare Other | Admitting: Nurse Practitioner

## 2021-01-02 ENCOUNTER — Other Ambulatory Visit: Payer: Self-pay

## 2021-01-02 ENCOUNTER — Encounter: Payer: Self-pay | Admitting: Nurse Practitioner

## 2021-01-02 VITALS — BP 109/66 | HR 60 | Temp 98.4°F | Wt 173.6 lb

## 2021-01-02 DIAGNOSIS — Z6828 Body mass index (BMI) 28.0-28.9, adult: Secondary | ICD-10-CM

## 2021-01-02 DIAGNOSIS — E1169 Type 2 diabetes mellitus with other specified complication: Secondary | ICD-10-CM

## 2021-01-02 DIAGNOSIS — E1159 Type 2 diabetes mellitus with other circulatory complications: Secondary | ICD-10-CM | POA: Diagnosis not present

## 2021-01-02 DIAGNOSIS — I152 Hypertension secondary to endocrine disorders: Secondary | ICD-10-CM | POA: Diagnosis not present

## 2021-01-02 DIAGNOSIS — E785 Hyperlipidemia, unspecified: Secondary | ICD-10-CM | POA: Diagnosis not present

## 2021-01-02 DIAGNOSIS — E1140 Type 2 diabetes mellitus with diabetic neuropathy, unspecified: Secondary | ICD-10-CM | POA: Diagnosis not present

## 2021-01-02 LAB — BAYER DCA HB A1C WAIVED: HB A1C (BAYER DCA - WAIVED): 6.6 % — ABNORMAL HIGH (ref 4.8–5.6)

## 2021-01-02 NOTE — Assessment & Plan Note (Signed)
Chronic, ongoing.  A1C 6.7% last visit, recheck today -- often is in 6 range and stable.  Urine ALB 30 and A:C 30-300 in March 2022, will continue Lisinopril for kidney protection.  Continue current medication regimen and adjust as needed.  Recommend continued focus on daily exercise, which he has started again, and healthy diet.  Return in 3 months for T2DM.  Could consider addition of Farxiga or Jardiance if elevations in future.

## 2021-01-02 NOTE — Assessment & Plan Note (Signed)
Chronic, ongoing.  Continue current medication regimen and adjust as needed.  Lipid panel up to date. 

## 2021-01-02 NOTE — Progress Notes (Signed)
BP 109/66   Pulse 60 Comment: apical  Temp 98.4 F (36.9 C) (Oral)   Wt 173 lb 9.6 oz (78.7 kg)   SpO2 94%   BMI 28.02 kg/m    Subjective:    Patient ID: Lawrence Jensen, male    DOB: July 26, 1950, 70 y.o.   MRN: [id number]  HPI: Lawrence Jensen is a 70 y.o. male  Chief Complaint  Patient presents with   Diabetes    Patient denies having a recent Diabetic Eye Exam. Patient denies having any concerns at today's visit.   Hyperlipidemia   Hypertension   DIABETES Takes Metformin 1000 MG two times a day.  Last A1C 6.7% in June.   Hypoglycemic episodes:no Polydipsia/polyuria: no Visual disturbance: no Chest pain: no Paresthesias: no Glucose Monitoring: yes             Accucheck frequency: Daily             Fasting glucose: 130-150 range on average             Post prandial:             Evening:             Before meals: Taking Insulin?: no             Long acting insulin:             Short acting insulin: Blood Pressure Monitoring: daily Retinal Examination: Not Up to Date -- [place] Exam: Up to Date Pneumovax: refused Influenza: refused Aspirin: yes    HYPERTENSION / HYPERLIPIDEMIA Continues on Lisinopril 20 MG and Amlodipine 5 MG + Crestor 20 MG daily. Is taking Meloxicam just occasionally for arthritis pain -- has not needed in 2 weeks. Satisfied with current treatment? yes Duration of hypertension: chronic BP monitoring frequency: occasionally BP range: 120-130/70's at home BP medication side effects: no Duration of hyperlipidemia: chronic Cholesterol medication side effects: no Cholesterol supplements: fish oil Medication compliance: good compliance Aspirin: yes Recent stressors: no Recurrent headaches: no Visual changes: no Palpitations: no Dyspnea: no Chest pain: no Lower extremity edema: no Dizzy/lightheaded: no   Relevant past medical, surgical, family and social history reviewed and updated as indicated. Interim medical  history since our last visit reviewed. Allergies and medications reviewed and updated.  Review of Systems  Constitutional:  Negative for activity change, diaphoresis, fatigue and fever.  Respiratory:  Negative for cough, chest tightness, shortness of breath and wheezing.   Cardiovascular:  Negative for chest pain, palpitations and leg swelling.  Gastrointestinal: Negative.   Endocrine: Negative for cold intolerance, heat intolerance, polydipsia, polyphagia and polyuria.  Neurological: Negative.   Psychiatric/Behavioral: Negative.     Per HPI unless specifically indicated above     Objective:    BP 109/66   Pulse 60 Comment: apical  Temp 98.4 F (36.9 C) (Oral)   Wt 173 lb 9.6 oz (78.7 kg)   SpO2 94%   BMI 28.02 kg/m   Wt Readings from Last 3 Encounters:  01/02/21 173 lb 9.6 oz (78.7 kg)  12/06/20 175 lb (79.4 kg)  10/03/20 177 lb 12.8 oz (80.6 kg)    Physical Exam Vitals and nursing note reviewed.  Constitutional:      General: He is awake. He is not in acute distress.    Appearance: He is well-developed. He is not ill-appearing.  HENT:     Head: Normocephalic and atraumatic.     Right Ear: Hearing normal. No drainage.  Left Ear: Hearing normal. No drainage.  Eyes:     General: Lids are normal.        Right eye: No discharge.        Left eye: No discharge.     Conjunctiva/sclera: Conjunctivae normal.     Pupils: Pupils are equal, round, and reactive to light.  Neck:     Thyroid: No thyromegaly.     Vascular: No carotid bruit.  Cardiovascular:     Rate and Rhythm: Normal rate and regular rhythm.     Heart sounds: Normal heart sounds, S1 normal and S2 normal. No murmur heard.   No gallop.  Pulmonary:     Effort: Pulmonary effort is normal. No accessory muscle usage or respiratory distress.     Breath sounds: Normal breath sounds.  Abdominal:     General: Bowel sounds are normal.     Palpations: Abdomen is soft.  Musculoskeletal:        General: Normal range  of motion.     Cervical back: Normal range of motion and neck supple.     Right lower leg: No edema.     Left lower leg: No edema.  Skin:    General: Skin is warm and dry.     Capillary Refill: Capillary refill takes less than 2 seconds.  Neurological:     Mental Status: He is alert and oriented to person, place, and time.     Deep Tendon Reflexes: Reflexes are normal and symmetric.  Psychiatric:        Attention and Perception: Attention normal.        Mood and Affect: Mood normal.        Behavior: Behavior normal. Behavior is cooperative.        Thought Content: Thought content normal.   Results for orders placed or performed in visit on 10/03/20  Bayer DCA Hb A1c Waived  Result Value Ref Range   HB A1C (BAYER DCA - WAIVED) 6.7 <7.0 %  CBC with Differential/Platelet  Result Value Ref Range   WBC 4.6 3.4 - 10.8 x10E3/uL   RBC 4.78 4.14 - 5.80 x10E6/uL   Hemoglobin 15.1 13.0 - 17.7 g/dL   Hematocrit 43.8 37.5 - 51.0 %   MCV 92 79 - 97 fL   MCH 31.6 26.6 - 33.0 pg   MCHC 34.5 31.5 - 35.7 g/dL   RDW 12.9 11.6 - 15.4 %   Platelets 205 150 - 450 x10E3/uL   Neutrophils 63 Not Estab. %   Lymphs 26 Not Estab. %   Monocytes 6 Not Estab. %   Eos 4 Not Estab. %   Basos 1 Not Estab. %   Neutrophils Absolute 2.9 1.4 - 7.0 x10E3/uL   Lymphocytes Absolute 1.2 0.7 - 3.1 x10E3/uL   Monocytes Absolute 0.3 0.1 - 0.9 x10E3/uL   EOS (ABSOLUTE) 0.2 0.0 - 0.4 x10E3/uL   Basophils Absolute 0.1 0.0 - 0.2 x10E3/uL   Immature Granulocytes 0 Not Estab. %   Immature Grans (Abs) 0.0 0.0 - 0.1 x10E3/uL  Comprehensive metabolic panel  Result Value Ref Range   Glucose 158 (H) 65 - 99 mg/dL   BUN 14 8 - 27 mg/dL   Creatinine, Ser 0.95 0.76 - 1.27 mg/dL   eGFR 87 >59 mL/min/1.73   BUN/Creatinine Ratio 15 10 - 24   Sodium 144 134 - 144 mmol/L   Potassium 4.6 3.5 - 5.2 mmol/L   Chloride 100 96 - 106 mmol/L   CO2 23 20 - 29  mmol/L   Calcium 9.8 8.6 - 10.2 mg/dL   Total Protein 6.5 6.0 - 8.5 g/dL    Albumin 4.6 3.8 - 4.8 g/dL   Globulin, Total 1.9 1.5 - 4.5 g/dL   Albumin/Globulin Ratio 2.4 (H) 1.2 - 2.2   Bilirubin Total 0.7 0.0 - 1.2 mg/dL   Alkaline Phosphatase 83 44 - 121 IU/L   AST 18 0 - 40 IU/L   ALT 19 0 - 44 IU/L  Lipid Panel w/o Chol/HDL Ratio  Result Value Ref Range   Cholesterol, Total 97 (L) 100 - 199 mg/dL   Triglycerides 96 0 - 149 mg/dL   HDL 41 >39 mg/dL   VLDL Cholesterol Cal 18 5 - 40 mg/dL   LDL Chol Calc (NIH) 38 0 - 99 mg/dL  TSH  Result Value Ref Range   TSH 1.380 0.450 - 4.500 uIU/mL  PSA  Result Value Ref Range   Prostate Specific Ag, Serum 0.3 0.0 - 4.0 ng/mL  Magnesium  Result Value Ref Range   Magnesium 2.1 1.6 - 2.3 mg/dL      Assessment & Plan:   Problem List Items Addressed This Visit       Cardiovascular and Mediastinum   Hypertension associated with diabetes ([place])    Chronic, ongoing with BP at goal in office and on home checks. Continue Lisinopril 20 MG and Amlodipine 5 MG daily -- refills up to date. Have advised he continue using Meloxicam minimally and discussed risks with consistent use.  Monitor BP at home daily and focus on DASH diet.  Labs up to date.   Return in 3 months for T2DM + HTN/HLD.          Endocrine   Type 2 diabetes mellitus with diabetic neuropathy, without long-term current use of insulin (HCC) - Primary    Chronic, ongoing.  A1C 6.7% last visit, recheck today -- often is in 6 range and stable.  Urine ALB 30 and A:C 30-300 in March 2022, will continue Lisinopril for kidney protection.  Continue current medication regimen and adjust as needed.  Recommend continued focus on daily exercise, which he has started again, and healthy diet.  Return in 3 months for T2DM.  Could consider addition of Farxiga or Jardiance if elevations in future.      Relevant Orders   Bayer DCA Hb A1c Waived   Hyperlipidemia associated with type 2 diabetes mellitus (HCC)    Chronic, ongoing.  Continue current medication regimen and adjust as  needed.  Lipid panel up to date.        Other   BMI 28.0-28.9,adult    BMI 28.02.  Recommended eating smaller high protein, low fat meals more frequently and exercising 30 mins a day 5 times a week with a goal of 10-15lb weight loss in the next 3 months. Patient voiced their understanding and motivation to adhere to these recommendations.         Follow up plan: Return in about 3 months (around 04/03/2021) for T2DM, HTN/HLD.

## 2021-01-02 NOTE — Assessment & Plan Note (Signed)
Chronic, ongoing with BP at goal in office and on home checks. Continue Lisinopril 20 MG and Amlodipine 5 MG daily -- refills up to date. Have advised he continue using Meloxicam minimally and discussed risks with consistent use.  Monitor BP at home daily and focus on DASH diet.  Labs up to date.   Return in 3 months for T2DM + HTN/HLD.

## 2021-01-02 NOTE — Patient Instructions (Signed)
Diabetes Mellitus and Nutrition, Adult When you have diabetes, or diabetes mellitus, it is very important to have healthy eating habits because your blood sugar (glucose) levels are greatly affected by what you eat and drink. Eating healthy foods in the right amounts, at about the same times every day, can help you:  Control your blood glucose.  Lower your risk of heart disease.  Improve your blood pressure.  Reach or maintain a healthy weight. What can affect my meal plan? Every person with diabetes is different, and each person has different needs for a meal plan. Your health care provider may recommend that you work with a dietitian to make a meal plan that is best for you. Your meal plan may vary depending on factors such as:  The calories you need.  The medicines you take.  Your weight.  Your blood glucose, blood pressure, and cholesterol levels.  Your activity level.  Other health conditions you have, such as heart or kidney disease. How do carbohydrates affect me? Carbohydrates, also called carbs, affect your blood glucose level more than any other type of food. Eating carbs naturally raises the amount of glucose in your blood. Carb counting is a method for keeping track of how many carbs you eat. Counting carbs is important to keep your blood glucose at a healthy level, especially if you use insulin or take certain oral diabetes medicines. It is important to know how many carbs you can safely have in each meal. This is different for every person. Your dietitian can help you calculate how many carbs you should have at each meal and for each snack. How does alcohol affect me? Alcohol can cause a sudden decrease in blood glucose (hypoglycemia), especially if you use insulin or take certain oral diabetes medicines. Hypoglycemia can be a life-threatening condition. Symptoms of hypoglycemia, such as sleepiness, dizziness, and confusion, are similar to symptoms of having too much  alcohol.  Do not drink alcohol if: ? Your health care provider tells you not to drink. ? You are pregnant, may be pregnant, or are planning to become pregnant.  If you drink alcohol: ? Do not drink on an empty stomach. ? Limit how much you use to:  0-1 drink a day for women.  0-2 drinks a day for men. ? Be aware of how much alcohol is in your drink. In the [place] one drink equals one 12 oz bottle of beer (355 mL), one 5 oz glass of wine (148 mL), or one 1 oz glass of hard liquor (44 mL). ? Keep yourself hydrated with water, diet soda, or unsweetened iced tea.  Keep in mind that regular soda, juice, and other mixers may contain a lot of sugar and must be counted as carbs. What are tips for following this plan? Reading food labels  Start by checking the serving size on the "Nutrition Facts" label of packaged foods and drinks. The amount of calories, carbs, fats, and other nutrients listed on the label is based on one serving of the item. Many items contain more than one serving per package.  Check the total grams (g) of carbs in one serving. You can calculate the number of servings of carbs in one serving by dividing the total carbs by 15. For example, if a food has 30 g of total carbs per serving, it would be equal to 2 servings of carbs.  Check the number of grams (g) of saturated fats and trans fats in one serving. Choose foods that have   a low amount or none of these fats.  Check the number of milligrams (mg) of salt (sodium) in one serving. Most people should limit total sodium intake to less than 2,300 mg per day.  Always check the nutrition information of foods labeled as "low-fat" or "nonfat." These foods may be higher in added sugar or refined carbs and should be avoided.  Talk to your dietitian to identify your daily goals for nutrients listed on the label. Shopping  Avoid buying canned, pre-made, or processed foods. These foods tend to be high in fat, sodium, and added  sugar.  Shop around the outside edge of the grocery store. This is where you will most often find fresh fruits and vegetables, bulk grains, fresh meats, and fresh dairy. Cooking  Use low-heat cooking methods, such as baking, instead of high-heat cooking methods like deep frying.  Cook using healthy oils, such as olive, canola, or sunflower oil.  Avoid cooking with butter, cream, or high-fat meats. Meal planning  Eat meals and snacks regularly, preferably at the same times every day. Avoid going long periods of time without eating.  Eat foods that are high in fiber, such as fresh fruits, vegetables, beans, and whole grains. Talk with your dietitian about how many servings of carbs you can eat at each meal.  Eat 4-6 oz (112-168 g) of lean protein each day, such as lean meat, chicken, fish, eggs, or tofu. One ounce (oz) of lean protein is equal to: ? 1 oz (28 g) of meat, chicken, or fish. ? 1 egg. ?  cup (62 g) of tofu.  Eat some foods each day that contain healthy fats, such as avocado, nuts, seeds, and fish.   What foods should I eat? Fruits Berries. Apples. Oranges. Peaches. Apricots. Plums. Grapes. Mango. Papaya. Pomegranate. Kiwi. Cherries. Vegetables Lettuce. Spinach. Leafy greens, including kale, chard, collard greens, and mustard greens. Beets. Cauliflower. Cabbage. Broccoli. Carrots. Green beans. Tomatoes. Peppers. Onions. Cucumbers. Brussels sprouts. Grains Whole grains, such as whole-wheat or whole-grain bread, crackers, tortillas, cereal, and pasta. Unsweetened oatmeal. Quinoa. Brown or wild rice. Meats and other proteins Seafood. Poultry without skin. Lean cuts of poultry and beef. Tofu. Nuts. Seeds. Dairy Low-fat or fat-free dairy products such as milk, yogurt, and cheese. The items listed above may not be a complete list of foods and beverages you can eat. Contact a dietitian for more information. What foods should I avoid? Fruits Fruits canned with  syrup. Vegetables Canned vegetables. Frozen vegetables with butter or cream sauce. Grains Refined white flour and flour products such as bread, pasta, snack foods, and cereals. Avoid all processed foods. Meats and other proteins Fatty cuts of meat. Poultry with skin. Breaded or fried meats. Processed meat. Avoid saturated fats. Dairy Full-fat yogurt, cheese, or milk. Beverages Sweetened drinks, such as soda or iced tea. The items listed above may not be a complete list of foods and beverages you should avoid. Contact a dietitian for more information. Questions to ask a health care provider  Do I need to meet with a diabetes educator?  Do I need to meet with a dietitian?  What number can I call if I have questions?  When are the best times to check my blood glucose? Where to find more information:  American Diabetes Association: diabetes.org  Academy of Nutrition and Dietetics: www.eatright.org  National Institute of Diabetes and Digestive and Kidney Diseases: www.niddk.nih.gov  Association of Diabetes Care and Education Specialists: www.diabeteseducator.org Summary  It is important to have healthy eating   habits because your blood sugar (glucose) levels are greatly affected by what you eat and drink.  A healthy meal plan will help you control your blood glucose and maintain a healthy lifestyle.  Your health care provider may recommend that you work with a dietitian to make a meal plan that is best for you.  Keep in mind that carbohydrates (carbs) and alcohol have immediate effects on your blood glucose levels. It is important to count carbs and to use alcohol carefully. This information is not intended to replace advice given to you by your health care provider. Make sure you discuss any questions you have with your health care provider. Document Revised: 03/07/2019 Document Reviewed: 03/07/2019 Elsevier Patient Education  2021 [employer]  

## 2021-01-02 NOTE — Assessment & Plan Note (Signed)
BMI 28.02.  Recommended eating smaller high protein, low fat meals more frequently and exercising 30 mins a day 5 times a week with a goal of 10-15lb weight loss in the next 3 months. Patient voiced their understanding and motivation to adhere to these recommendations.

## 2021-04-03 ENCOUNTER — Other Ambulatory Visit: Payer: Self-pay

## 2021-04-03 ENCOUNTER — Encounter: Payer: Self-pay | Admitting: Nurse Practitioner

## 2021-04-03 ENCOUNTER — Ambulatory Visit (INDEPENDENT_AMBULATORY_CARE_PROVIDER_SITE_OTHER): Payer: Medicare Other | Admitting: Nurse Practitioner

## 2021-04-03 VITALS — BP 121/70 | HR 66 | Ht 66.0 in | Wt 171.2 lb

## 2021-04-03 DIAGNOSIS — E1140 Type 2 diabetes mellitus with diabetic neuropathy, unspecified: Secondary | ICD-10-CM

## 2021-04-03 DIAGNOSIS — E1169 Type 2 diabetes mellitus with other specified complication: Secondary | ICD-10-CM

## 2021-04-03 DIAGNOSIS — I152 Hypertension secondary to endocrine disorders: Secondary | ICD-10-CM | POA: Diagnosis not present

## 2021-04-03 DIAGNOSIS — Z6828 Body mass index (BMI) 28.0-28.9, adult: Secondary | ICD-10-CM

## 2021-04-03 DIAGNOSIS — E1159 Type 2 diabetes mellitus with other circulatory complications: Secondary | ICD-10-CM | POA: Diagnosis not present

## 2021-04-03 DIAGNOSIS — R7989 Other specified abnormal findings of blood chemistry: Secondary | ICD-10-CM

## 2021-04-03 DIAGNOSIS — E785 Hyperlipidemia, unspecified: Secondary | ICD-10-CM

## 2021-04-03 LAB — BAYER DCA HB A1C WAIVED: HB A1C (BAYER DCA - WAIVED): 7.1 % — ABNORMAL HIGH (ref 4.8–5.6)

## 2021-04-03 NOTE — Assessment & Plan Note (Addendum)
Chronic, ongoing with BP at goal in office and on home checks. Continue Lisinopril 20 MG and Amlodipine 5 MG daily -- refills up to date. Have advised he continue using Meloxicam minimally and discussed risks with consistent use.  Monitor BP at home daily and focus on DASH diet.  Labs CMP today.   Return in 3 months for T2DM + HTN/HLD.

## 2021-04-03 NOTE — Progress Notes (Signed)
BP 121/70    Pulse 66    Ht 5\' 6"  (1.676 m)    Wt 171 lb 3.2 oz (77.7 kg)    SpO2 98%    BMI 27.63 kg/m    Subjective:    Patient ID: , male    DOB: 05-17-1950, 70 y.o.   MRN: [id number]  HPI: Lawrence Jensen is a 70 y.o. male  Chief Complaint  Patient presents with   Diabetes   Hyperlipidemia   Hypertension   Testosterone    Patient states he would like to have his testosterone levels check at today's visit.    Would like testosterone level checked today, when he exerts energy he feels very fatigue and has increase in fatigue.  He remains to have difficulty attaining erection.   DIABETES Takes Metformin 1000 MG two times a day.  Last A1c 6.6% in at last visit.   Hypoglycemic episodes:no Polydipsia/polyuria: no Visual disturbance: no Chest pain: no Paresthesias: no Glucose Monitoring: yes             Accucheck frequency: Daily             Fasting glucose: 130-150 average -- 141 this morning             Post prandial:             Evening:             Before meals: Taking Insulin?: no             Long acting insulin:             Short acting insulin: Blood Pressure Monitoring: daily Retinal Examination: Not Up to Date -- [facility] -- April or May before he can get in Foot Exam: Up to Date Pneumovax: refused Influenza: refused Aspirin: yes    HYPERTENSION / HYPERLIPIDEMIA Continues on Lisinopril 20 MG and Amlodipine 5 MG + Crestor 20 MG daily. Takes Meloxicam just occasionally for arthritis pain -- knows to take minimally. Satisfied with current treatment? yes Duration of hypertension: chronic BP monitoring frequency: occasionally BP range: 120-130/70's at home on average BP medication side effects: no Duration of hyperlipidemia: chronic Cholesterol medication side effects: no Cholesterol supplements: fish oil Medication compliance: good compliance Aspirin: yes Recent stressors: no Recurrent headaches: no Visual changes:  no Palpitations: no Dyspnea: no Chest pain: no Lower extremity edema: no Dizzy/lightheaded: no   Depression screen [facility] 2/9 04/03/2021 12/06/2020 10/03/2020 12/04/2019 09/06/2019  Decreased Interest 0 0 0 0 0  Down, Depressed, Hopeless 0 0 0 0 0  PHQ - 2 Score 0 0 0 0 0  Altered sleeping 0 - - - -  Tired, decreased energy 3 - - - -  Change in appetite 0 - - - -  Feeling bad or failure about yourself  0 - - - -  Trouble concentrating 0 - - - -  Moving slowly or fidgety/restless 2 - - - -  Suicidal thoughts 0 - - - -  PHQ-9 Score 5 - - - -  Difficult doing work/chores Not difficult at all - - - -    GAD 7 : Generalized Anxiety Score 04/03/2021  Nervous, Anxious, on Edge 0  Control/stop worrying 0  Worry too much - different things 0  Trouble relaxing 0  Restless 0  Easily annoyed or irritable 2  Afraid - awful might happen 0  Total GAD 7 Score 2  Anxiety Difficulty Not difficult at all  Relevant past medical, surgical, family and social history reviewed and updated as indicated. Interim medical history since our last visit reviewed. Allergies and medications reviewed and updated.  Review of Systems  Constitutional:  Negative for activity change, diaphoresis, fatigue and fever.  Respiratory:  Negative for cough, chest tightness, shortness of breath and wheezing.   Cardiovascular:  Negative for chest pain, palpitations and leg swelling.  Gastrointestinal: Negative.   Endocrine: Negative for cold intolerance, heat intolerance, polydipsia, polyphagia and polyuria.  Neurological: Negative.   Psychiatric/Behavioral: Negative.     Per HPI unless specifically indicated above     Objective:    BP 121/70    Pulse 66    Ht 5\' 6"  (1.676 m)    Wt 171 lb 3.2 oz (77.7 kg)    SpO2 98%    BMI 27.63 kg/m   Wt Readings from Last 3 Encounters:  04/03/21 171 lb 3.2 oz (77.7 kg)  01/02/21 173 lb 9.6 oz (78.7 kg)  12/06/20 175 lb (79.4 kg)    Physical Exam Vitals and nursing note  reviewed.  Constitutional:      General: He is awake. He is not in acute distress.    Appearance: He is well-developed. He is not ill-appearing.  HENT:     Head: Normocephalic and atraumatic.     Right Ear: Hearing normal. No drainage.     Left Ear: Hearing normal. No drainage.  Eyes:     General: Lids are normal.        Right eye: No discharge.        Left eye: No discharge.     Conjunctiva/sclera: Conjunctivae normal.     Pupils: Pupils are equal, round, and reactive to light.  Neck:     Thyroid: No thyromegaly.     Vascular: No carotid bruit.  Cardiovascular:     Rate and Rhythm: Normal rate and regular rhythm.     Heart sounds: Normal heart sounds, S1 normal and S2 normal. No murmur heard.   No gallop.  Pulmonary:     Effort: Pulmonary effort is normal. No accessory muscle usage or respiratory distress.     Breath sounds: Normal breath sounds.  Abdominal:     General: Bowel sounds are normal.     Palpations: Abdomen is soft.  Musculoskeletal:        General: Normal range of motion.     Cervical back: Normal range of motion and neck supple.     Right lower leg: No edema.     Left lower leg: No edema.  Skin:    General: Skin is warm and dry.     Capillary Refill: Capillary refill takes less than 2 seconds.  Neurological:     Mental Status: He is alert and oriented to person, place, and time.     Deep Tendon Reflexes: Reflexes are normal and symmetric.  Psychiatric:        Attention and Perception: Attention normal.        Mood and Affect: Mood normal.        Behavior: Behavior normal. Behavior is cooperative.        Thought Content: Thought content normal.   Diabetic Foot Exam - Simple   Simple Foot Form Visual Inspection See comments: Yes Sensation Testing Intact to touch and monofilament testing bilaterally: Yes Pulse Check See comments: Yes Comments 1+ pulses DP ant PT bilaterally, diminished hair pattern, no swelling.  Onychomycosis.    Results for orders  placed or performed in  visit on 01/02/21  Bayer DCA Hb A1c Waived  Result Value Ref Range   HB A1C (BAYER DCA - WAIVED) 6.6 (H) 4.8 - 5.6 %      Assessment & Plan:   Problem List Items Addressed This Visit       Cardiovascular and Mediastinum   Hypertension associated with diabetes (HCC)    Chronic, ongoing with BP at goal in office and on home checks. Continue Lisinopril 20 MG and Amlodipine 5 MG daily -- refills up to date. Have advised he continue using Meloxicam minimally and discussed risks with consistent use.  Monitor BP at home daily and focus on DASH diet.  Labs CMP today.   Return in 3 months for T2DM + HTN/HLD.        Relevant Orders   Bayer DCA Hb A1c Waived   Comprehensive metabolic panel     Endocrine   Hyperlipidemia associated with type 2 diabetes mellitus (HCC)    Chronic, ongoing.  Continue current medication regimen and adjust as needed.  Lipid panel today.      Relevant Orders   Bayer DCA Hb A1c Waived   Lipid Panel w/o Chol/HDL Ratio   Type 2 diabetes mellitus with diabetic neuropathy, without long-term current use of insulin (HCC) - Primary    Chronic, ongoing.  A1C 7.1% today, trend up for him -- often is in 6 range and stable.  Urine ALB 30 and A:C 30-300 in March 2022, will continue Lisinopril for kidney protection.  Continue current medication regimen and adjust as needed -- discussed need for heavy diet focus.  Recommend continued focus on daily exercise, which he has started again, and healthy diet.  Return in 3 months for T2DM.  Could consider addition of Farxiga or Jardiance if elevations in future.      Relevant Orders   Bayer DCA Hb A1c Waived     Other   BMI 28.0-28.9,adult    Recommended eating smaller high protein, low fat meals more frequently and exercising 30 mins a day 5 times a week with a goal of 10-15lb weight loss in the next 3 months. Patient voiced their understanding and motivation to adhere to these recommendations.       Other  Visit Diagnoses     Low testosterone in male       Check testosterone level today.   Relevant Orders   Testosterone, free, total([facility])        Follow up plan: Return in about 3 months (around 07/02/2021) for T2DM, HTN/HLD.

## 2021-04-03 NOTE — Assessment & Plan Note (Signed)
Chronic, ongoing.  A1C 7.1% today, trend up for him -- often is in 6 range and stable.  Urine ALB 30 and A:C 30-300 in March 2022, will continue Lisinopril for kidney protection.  Continue current medication regimen and adjust as needed -- discussed need for heavy diet focus.  Recommend continued focus on daily exercise, which he has started again, and healthy diet.  Return in 3 months for T2DM.  Could consider addition of Farxiga or Jardiance if elevations in future.

## 2021-04-03 NOTE — Assessment & Plan Note (Signed)
Chronic, ongoing.  Continue current medication regimen and adjust as needed. Lipid panel today. 

## 2021-04-03 NOTE — Assessment & Plan Note (Signed)
Recommended eating smaller high protein, low fat meals more frequently and exercising 30 mins a day 5 times a week with a goal of 10-15lb weight loss in the next 3 months. Patient voiced their understanding and motivation to adhere to these recommendations.  

## 2021-04-03 NOTE — Patient Instructions (Signed)

## 2021-04-04 ENCOUNTER — Other Ambulatory Visit: Payer: Self-pay | Admitting: Nurse Practitioner

## 2021-04-04 DIAGNOSIS — E785 Hyperlipidemia, unspecified: Secondary | ICD-10-CM

## 2021-04-04 DIAGNOSIS — R7989 Other specified abnormal findings of blood chemistry: Secondary | ICD-10-CM

## 2021-04-04 NOTE — Progress Notes (Signed)
Good morning, please let Lawrence Jensen know his labs have returned: - Kidney and liver function remain stable. - Cholesterol levels show upward trend in LDL, bad cholesterol, last visit this was 38 and yesterday 129.  Were you fasting prior to labs?  Are you taking Rosuvastatin?  We will recheck this next visit fasting and please ensure you are taking your medication daily. - Testosterone level is on lower side of normal.  I would like you to return first thing in morning in 4 weeks for lab only visit to recheck.  If it remains lower side I may send you to urology for further evaluation.  Schedule lab only visit.  Come fasting and we will recheck cholesterol too.  Any questions?  Merry Christmas!! Keep being amazing!!  Thank you for allowing me to participate in your care.  I appreciate you. Kindest regards, Lilymarie Scroggins

## 2021-04-04 NOTE — Addendum Note (Signed)
Addended by: Aura Dials T on: 04/04/2021 03:43 PM   Modules accepted: Orders

## 2021-04-05 LAB — LIPID PANEL W/O CHOL/HDL RATIO
Cholesterol, Total: 194 mg/dL (ref 100–199)
HDL: 41 mg/dL (ref >39)
LDL Chol Calc (NIH): 129 mg/dL — ABNORMAL HIGH (ref 0–99)
Triglycerides: 133 mg/dL (ref 0–149)
VLDL Cholesterol Cal: 24 mg/dL (ref 5–40)

## 2021-04-05 LAB — COMPREHENSIVE METABOLIC PANEL
ALT: 14 IU/L (ref 0–44)
AST: 18 IU/L (ref 0–40)
Albumin/Globulin Ratio: 2 (ref 1.2–2.2)
Albumin: 4.7 g/dL (ref 3.8–4.8)
Alkaline Phosphatase: 108 IU/L (ref 44–121)
BUN/Creatinine Ratio: 14 (ref 10–24)
BUN: 14 mg/dL (ref 8–27)
Bilirubin Total: 0.5 mg/dL (ref 0.0–1.2)
CO2: 24 mmol/L (ref 20–29)
Calcium: 9.7 mg/dL (ref 8.6–10.2)
Chloride: 98 mmol/L (ref 96–106)
Creatinine, Ser: 1.01 mg/dL (ref 0.76–1.27)
Globulin, Total: 2.3 g/dL (ref 1.5–4.5)
Glucose: 146 mg/dL — ABNORMAL HIGH (ref 70–99)
Potassium: 4.6 mmol/L (ref 3.5–5.2)
Sodium: 138 mmol/L (ref 134–144)
Total Protein: 7 g/dL (ref 6.0–8.5)
eGFR: 81 mL/min/{1.73_m2} (ref >59)

## 2021-04-05 LAB — TESTOSTERONE, FREE, TOTAL, SHBG
Sex Hormone Binding: 27.6 nmol/L (ref 19.3–76.4)
Testosterone, Free: 4.6 pg/mL — ABNORMAL LOW (ref 6.6–18.1)
Testosterone: 282 ng/dL (ref 264–916)

## 2021-04-24 ENCOUNTER — Encounter: Payer: Self-pay | Admitting: Urology

## 2021-04-24 ENCOUNTER — Ambulatory Visit: Payer: Medicare Other | Admitting: Urology

## 2021-04-24 ENCOUNTER — Other Ambulatory Visit: Payer: Self-pay

## 2021-04-24 VITALS — BP 137/77 | HR 60 | Ht 66.0 in | Wt 165.0 lb

## 2021-04-24 DIAGNOSIS — R7989 Other specified abnormal findings of blood chemistry: Secondary | ICD-10-CM | POA: Diagnosis not present

## 2021-04-24 NOTE — Progress Notes (Signed)
04/24/2021 8:07 AM   Lawrence Jensen [birth year] [id number]  Referring provider: Venita Lick, NP [address] [place],  [place] [address]  Chief Complaint  Patient presents with   Hypogonadism    HPI: Lawrence Jensen is a 71 y.o. male referred for hypogonadism.  PCP visit September 2022 requesting testosterone level due to increasing fatigue and difficulty maintaining an erection Complains of a 7-year history of chronic fatigue.  Also notes loss of muscle mass and erectile dysfunction He failed PDE 5 inhibitors and was also on intracavernosal injections which were not effective.  He does have significant organic risk factors including hyperlipidemia, hypertension, antihypertensive medication, diabetes and significant tobacco history Libido is good Total testosterone level low normal at 282 with a low free Tof 4.6 PSA 09/2020 0.3 Some urinary frequency/urgency and has been on Myrbetriq in the past   PMH: Past Medical History:  Diagnosis Date   Diabetes mellitus without complication ([facility])    ED (erectile dysfunction)    Hyperlipidemia    Hypertension     Surgical History: Past Surgical History:  Procedure Laterality Date   APPENDECTOMY  1999    Home Medications:  Allergies as of 04/24/2021   No Known Allergies      Medication List        Accurate as of April 24, 2021  8:07 AM. If you have any questions, ask your nurse or doctor.          Accu-Chek Aviva Plus test strip Generic drug: glucose blood USE AS INSTRUCTED   amLODipine 5 MG tablet Commonly known as: NORVASC Take 1 tablet (5 mg total) by mouth daily.   aspirin EC 81 MG tablet Take 81 mg by mouth daily.   COD LIVER OIL PO Take 3 tablets by mouth daily.   lisinopril 20 MG tablet Commonly known as: ZESTRIL Take 1 tablet (20 mg total) by mouth daily.   Magnesium 400 MG Tabs Take 400 mg by mouth 2 (two) times daily.   meloxicam 15 MG tablet Commonly known as: MOBIC Take 1  tablet (15 mg total) by mouth daily.   metFORMIN 500 MG tablet Commonly known as: GLUCOPHAGE Take 2 tablets (1,000 mg total) by mouth 2 (two) times daily with a meal.   ONE TOUCH ULTRA 2 w/Device Kit Inject 1 kit into the skin 3 (three) times daily.   OT ULTRA/FASTTK CNTRL SOLN Soln   rosuvastatin 20 MG tablet Commonly known as: Crestor Take 1 tablet (20 mg total) by mouth daily.        Allergies: No Known Allergies  Family History: Family History  Problem Relation Age of Onset   Heart disease Mother    Heart disease Father    Congestive Heart Failure Father    Cancer Father        prostate   Diabetes Father    Stroke Father    Hypertension Father    Cancer Brother    Stroke Maternal Grandmother    Heart disease Brother    COPD Neg Hx     Social History:  reports that he quit smoking about 26 years ago. His smoking use included cigarettes. He has a 35.00 pack-year smoking history. He has never used smokeless tobacco. He reports that he does not drink alcohol and does not use drugs.   Physical Exam: BP 137/77    Pulse 60    Ht 5' 6"  (1.676 m)    Wt 165 lb (74.8 kg)  BMI 26.63 kg/m   Constitutional:  Alert and oriented, No acute distress. HEENT: [place] AT, moist mucus membranes.  Trachea midline, no masses. Cardiovascular: No clubbing, cyanosis, or edema. Respiratory: Normal respiratory effort, no increased work of breathing. GI: Abdomen is soft, nontender, nondistended, no abdominal masses GU: Phallus without lesions, testes descended bilaterally without masses or tenderness with volume ~ 20 cc.  Prostate 45 g, smooth without nodules Skin: No rashes, bruises or suspicious lesions. Neurologic: Grossly intact, no focal deficits, moving all 4 extremities. Psychiatric: Normal mood and affect.    Assessment & Plan:    1.  Hypogonadism Significant tiredness/fatigue He has ED that was refractory to intracavernosal injections and was informed it is unlikely  testosterone replacement will significantly improve his ED We discussed the diagnosis of testosterone is based on 2 abnormal total testosterone levels drawn in the a.m. admitted with signs and symptoms of low testosterone. We discussed various forms of testosterone placement including topical preparations, intramuscular injections, subcutaneous injections, subcutaneous pellet implantation and oral testosterone.  Pros and cons of each form were discussed.  The risk of transference of topical testosterone. I had an extensive discussion regarding testosterone replacement therapy including the following: Treatment may result in improvements in erectile function, low sex drive, anemia, bone mineral density, lean body mass, and depressive symptoms; evidence is inconclusive whether testosterone therapy improves cognitive function, measures of diabetes, energy, fatigue, lipid profiles, and quality of life measures; there is no conclusive evidence linking testosterone therapy to the development of prostate cancer; there is no definitive evidence linking testosterone therapy to a higher incidence of venothrombolic events; at the present time it cannot be stated definitively whether testosterone therapy increases or decreases the risk of cardiovascular events including myocardial infarction and stroke. Potential side effects were discussed including erythrocytosis, gynecomastia.  The need for regular monitoring of testosterone levels and hematocrit were discussed. Will check a repeat testosterone level this morning and an [place], [facility] [address], [place], [place] [address] [phone]

## 2021-04-25 LAB — PROLACTIN: Prolactin: 6 ng/mL (ref 4.0–15.2)

## 2021-04-25 LAB — TESTOSTERONE: Testosterone: 384 ng/dL (ref 264–916)

## 2021-04-25 LAB — LUTEINIZING HORMONE: LH: 3.8 m[IU]/mL (ref 1.7–8.6)

## 2021-04-26 ENCOUNTER — Encounter: Payer: Self-pay | Admitting: Urology

## 2021-04-27 ENCOUNTER — Telehealth: Payer: Self-pay | Admitting: Urology

## 2021-04-27 NOTE — Telephone Encounter (Signed)
Repeat free testosterone level was low.  We had discussed replacement options at prior office visit.  Is he interested in testosterone injections or testosterone gel?

## 2021-04-28 LAB — SPECIMEN STATUS REPORT

## 2021-04-28 LAB — TESTOSTERONE, FREE: Testosterone, Free: 6.3 pg/mL — ABNORMAL LOW (ref 6.6–18.1)

## 2021-04-28 NOTE — Telephone Encounter (Signed)
Patient would like to do the injection . He states he will give himself shots. I advised him that he would have to come in the office for a teaching. He understands.  [place].

## 2021-04-29 MED ORDER — TESTOSTERONE CYPIONATE 200 MG/ML IM SOLN: 200.0000 mg | INTRAMUSCULAR | 0 refills | Status: DC

## 2021-04-29 NOTE — Addendum Note (Signed)
Addended by: Abbie Sons on: 04/29/2021 07:40 PM   Modules accepted: Orders

## 2021-05-05 NOTE — Progress Notes (Signed)
Lawrence Jensen is a 71 y.o.  male with testosterone deficiency who presents today for instruction on delivering an IM injection of testosterone cypionate.    I instructed the patient to identify the concentration of his testosterone. Testosterone for injection is usually in the form of testosterone cypionate. These liquids come in multiple concentrations, so before giving an injection, its very important to make sure that his intended dosage takes into account the concentration of the testosterone serum. Usually, testosterone comes in a concentration of either 100 mg/ml or 200 mg/ml.  We typically use the 200 mg/mL in this office.    Using a sterile, 18 G needle and 3 cc syringe, the testosterone cypionate (lot # [id number] and exp. 10/2023) was drawn up for a 1 cc injection.  To draw up the dose, I demonstrated how to first draw air into the syringe equal to the volume of the dosage. Then, wipe the top of the medication bottle with an alcohol wipe, insert the needle through the lid and into the medication, and push the air from your syringe into the bottle. Turn the bottle upside down and draw out the exact dosage of testosterone.  I demonstrated how to aspirate the syringe by hinge the syringe with its needle uncapped and pointing up in front of him.  Looking for air bubbles in the syringe. Flick the side of the syringe to get these bubbles to rise to the top.    When the dosage is bubble-free, I slowly depressed the plunger to force the air at the top of the syringe out stopping when a tiny drop of medication comes out of the tip of the syringe.  I advised him to be certain no air remained in the syringe as injecting air is very dangerous.  Being careful not to squirt or spray a significant portion of the dosage onto the floor.  Preparing the injection site, outer middle third of the vastus lateralis muscle of the thigh, I took a sterile alcohol pad and wipe the immediate area around where I  intended him to inject.   I then demonstrated how to change the needle from the 18 G to the 21 G needle.  I then gave him the syringe for injecting.  He correctly identified the injection location.  He held the syringe like a dart at a 90-degree angle above the sterile injection site. Quickly plunged it into the flesh. Before depressing the plunger, he drew back on it slightly and no blood was seen.  I advised him that if blood flashed in the syringe, he would need to remove the needle and then select a different location as he was in the vein.  Inject the medication at a steady, controlled pace.  He fully depressed the plunger, slowly pull the needle out. Pressing around the injection site with a sterile cotton swab as he did so - this preventing the emerging needle from pulling on the skin and causing extra pain.  We assessed the needle entry point for bleeding, and applied a sterile Band-Aid and/or cotton swab if needed. Disposed of the used needle and syringe in a proper sharps container.  I advised him to acquire a sharps container for his personal use at home.  I advised him that If, after injection, he experienced redness, swelling, or discomfort beyond that of normal soreness at the site of injection, call our office for an appointment and instructions.  He is to always store his medication at the recommended temperature, and  always check the expiration date on the bottle. If its expired, don't use it.  Of course, keep all of med's out of reach of children.  Do not change his dose without consulting your provider.  His starting dose will be 1 cc every 2 weeks.  He will return 6 weeks for a testosterone level  Eugina Row, PA-C

## 2021-05-06 ENCOUNTER — Encounter: Payer: Self-pay | Admitting: Urology

## 2021-05-06 ENCOUNTER — Other Ambulatory Visit: Payer: Medicare Other

## 2021-05-06 ENCOUNTER — Other Ambulatory Visit: Payer: Self-pay

## 2021-05-06 ENCOUNTER — Ambulatory Visit: Payer: Medicare Other | Admitting: Urology

## 2021-05-06 VITALS — BP 128/66 | HR 67 | Ht 66.0 in | Wt 165.0 lb

## 2021-05-06 DIAGNOSIS — R7989 Other specified abnormal findings of blood chemistry: Secondary | ICD-10-CM | POA: Diagnosis not present

## 2021-05-06 DIAGNOSIS — E349 Endocrine disorder, unspecified: Secondary | ICD-10-CM

## 2021-05-06 DIAGNOSIS — E785 Hyperlipidemia, unspecified: Secondary | ICD-10-CM

## 2021-05-06 DIAGNOSIS — E1169 Type 2 diabetes mellitus with other specified complication: Secondary | ICD-10-CM | POA: Diagnosis not present

## 2021-05-06 MED ORDER — "BD DISP NEEDLES 18G X 1-1/2"" MISC": 1.0000 mg | 0 refills | Status: DC

## 2021-05-06 MED ORDER — SYRINGE 2-3 ML 3 ML MISC: 1.0000 mg | 3 refills | Status: DC

## 2021-05-06 MED ORDER — BD DISP NEEDLES 21G X 1-1/2" MISC
1.0000 mg | 0 refills | Status: DC
Start: 2021-05-06 — End: 2021-06-27

## 2021-05-07 NOTE — Progress Notes (Signed)
Good morning, please let Lawrence Jensen know his labs have returned.  Cholesterol levels are improved, continue statin daily.  Testosterone labs pending still, but I know he is seeing urology now and receiving treatment, will defer any changes in treatment to them.  Have a great day!! Keep being stellar!!  Thank you for allowing me to participate in your care.  I appreciate you. Kindest regards, Allan Minotti

## 2021-05-10 LAB — TESTOSTERONE, FREE, TOTAL, SHBG
Sex Hormone Binding: 30.4 nmol/L (ref 19.3–76.4)
Testosterone, Free: 3.6 pg/mL — ABNORMAL LOW (ref 6.6–18.1)
Testosterone: 343 ng/dL (ref 264–916)

## 2021-05-10 LAB — LIPID PANEL W/O CHOL/HDL RATIO
Cholesterol, Total: 98 mg/dL — ABNORMAL LOW (ref 100–199)
HDL: 43 mg/dL (ref >39)
LDL Chol Calc (NIH): 36 mg/dL (ref 0–99)
Triglycerides: 97 mg/dL (ref 0–149)
VLDL Cholesterol Cal: 19 mg/dL (ref 5–40)

## 2021-06-19 ENCOUNTER — Other Ambulatory Visit: Payer: Self-pay

## 2021-06-19 DIAGNOSIS — E349 Endocrine disorder, unspecified: Secondary | ICD-10-CM

## 2021-06-24 ENCOUNTER — Other Ambulatory Visit: Payer: Medicare Other

## 2021-06-24 ENCOUNTER — Other Ambulatory Visit: Payer: Self-pay

## 2021-06-24 DIAGNOSIS — E349 Endocrine disorder, unspecified: Secondary | ICD-10-CM

## 2021-06-25 LAB — TESTOSTERONE: Testosterone: 251 ng/dL — ABNORMAL LOW (ref 264–916)

## 2021-06-27 ENCOUNTER — Other Ambulatory Visit: Payer: Self-pay | Admitting: *Deleted

## 2021-06-27 DIAGNOSIS — E349 Endocrine disorder, unspecified: Secondary | ICD-10-CM

## 2021-06-27 NOTE — Telephone Encounter (Signed)
Spoke with patient about lab results and pt asking for refill for testosterone, syringes, and needles. ?

## 2021-06-29 DIAGNOSIS — R7989 Other specified abnormal findings of blood chemistry: Secondary | ICD-10-CM | POA: Insufficient documentation

## 2021-06-29 NOTE — Patient Instructions (Signed)

## 2021-06-30 MED ORDER — TESTOSTERONE CYPIONATE 200 MG/ML IM SOLN: 200.0000 mg | INTRAMUSCULAR | 0 refills | Status: DC

## 2021-06-30 MED ORDER — "BD DISP NEEDLES 18G X 1-1/2"" MISC": 1.0000 mg | 0 refills | Status: DC

## 2021-06-30 MED ORDER — "BD DISP NEEDLES 21G X 1-1/2"" MISC": 1.0000 mg | 0 refills | Status: DC

## 2021-06-30 MED ORDER — SYRINGE 2-3 ML 3 ML MISC: 1.0000 mg | 3 refills | Status: DC

## 2021-07-03 ENCOUNTER — Other Ambulatory Visit: Payer: Self-pay

## 2021-07-03 ENCOUNTER — Encounter: Payer: Self-pay | Admitting: Nurse Practitioner

## 2021-07-03 ENCOUNTER — Ambulatory Visit (INDEPENDENT_AMBULATORY_CARE_PROVIDER_SITE_OTHER): Payer: Medicare Other | Admitting: Nurse Practitioner

## 2021-07-03 VITALS — BP 119/71 | HR 58 | Temp 97.7°F | Ht 66.0 in | Wt 180.4 lb

## 2021-07-03 DIAGNOSIS — E1140 Type 2 diabetes mellitus with diabetic neuropathy, unspecified: Secondary | ICD-10-CM | POA: Diagnosis not present

## 2021-07-03 DIAGNOSIS — E785 Hyperlipidemia, unspecified: Secondary | ICD-10-CM | POA: Diagnosis not present

## 2021-07-03 DIAGNOSIS — I152 Hypertension secondary to endocrine disorders: Secondary | ICD-10-CM

## 2021-07-03 DIAGNOSIS — E1159 Type 2 diabetes mellitus with other circulatory complications: Secondary | ICD-10-CM

## 2021-07-03 DIAGNOSIS — E1169 Type 2 diabetes mellitus with other specified complication: Secondary | ICD-10-CM | POA: Diagnosis not present

## 2021-07-03 DIAGNOSIS — Z6828 Body mass index (BMI) 28.0-28.9, adult: Secondary | ICD-10-CM

## 2021-07-03 DIAGNOSIS — R7989 Other specified abnormal findings of blood chemistry: Secondary | ICD-10-CM | POA: Diagnosis not present

## 2021-07-03 LAB — MICROALBUMIN, URINE WAIVED
Creatinine, Urine Waived: 100 mg/dL (ref 10–300)
Microalb, Ur Waived: 30 mg/L — ABNORMAL HIGH (ref 0–19)
Microalb/Creat Ratio: <30 mg/g (ref <30)

## 2021-07-03 LAB — BAYER DCA HB A1C WAIVED: HB A1C (BAYER DCA - WAIVED): 6.3 % — ABNORMAL HIGH (ref 4.8–5.6)

## 2021-07-03 NOTE — Assessment & Plan Note (Signed)
Chronic, ongoing with BP at goal in office and on home checks. Continue Lisinopril 20 MG and Amlodipine 5 MG daily -- refills up to date. Have advised he continue using Meloxicam minimally and discussed risks with consistent use.  Monitor BP at home daily and focus on DASH diet.  Labs CMP and urine ALB today.   Return in 3 months for T2DM + HTN/HLD.   ?

## 2021-07-03 NOTE — Assessment & Plan Note (Signed)
Chronic, ongoing.  A1C 6.3% today, downward from previous 7.1% -- often is in 6 range and stable.  Urine ALB 30 and A:C 30-300 in March 2023, will continue Lisinopril for kidney protection.  Continue current medication regimen and adjust as needed.  Recommend continued focus on daily exercise, which he has started again, and healthy diet.  Return in 3 months for T2DM.  Could consider addition of Farxiga or Jardiance if elevations in future. ?

## 2021-07-03 NOTE — Assessment & Plan Note (Signed)
Continue collaboration with urology and current treatment as ordered by them. 

## 2021-07-03 NOTE — Assessment & Plan Note (Signed)
Chronic, ongoing.  Continue current medication regimen and adjust as needed. Lipid panel today. 

## 2021-07-03 NOTE — Assessment & Plan Note (Signed)
BMI 29.12, some gain present with starting testosterone.  Recommended eating smaller high protein, low fat meals more frequently and exercising 30 mins a day 5 times a week with a goal of 10-15lb weight loss in the next 3 months. Patient voiced their understanding and motivation to adhere to these recommendations. ?

## 2021-07-03 NOTE — Progress Notes (Signed)
? ?BP 119/71   Pulse (!) 58   Temp 97.7 ?F (36.5 ?C) (Oral)   Ht 5\' 6"  (1.676 m)   Wt 180 lb 6.4 oz (81.8 kg)   SpO2 97%   BMI 29.12 kg/m?   ? ?Subjective:  ? ? Patient ID: Lawrence Jensen, male    DOB: 20-Dec-1950, 71 y.o.   MRN: [id number] ? ?HPI: ?Dragon Thrush is a 71 y.o. male ? ?Chief Complaint  ?Patient presents with  ? Diabetes  ?  Patient declines having a recent Diabetic Eye Exam. Patient scheduled for upcoming visit.   ? Hypertension  ? Hyperlipidemia  ? ?Currently being followed by urology, Dr. 78, for low testosterone and is getting replacement -- reports this is starting to help with energy levels and overall feeling better.  Has not had to take Meloxicam since starting replacement, reports less aches and pains. ? ?DIABETES ?Takes Metformin 1000 MG two times a day.  Last A1c 7.1%. ?Hypoglycemic episodes:no ?Polydipsia/polyuria: no ?Visual disturbance: no ?Chest pain: no ?Paresthesias: no ?Glucose Monitoring: yes ?            Accucheck frequency: Daily ?            Fasting glucose: 120-130 average -- 122 this morning, lower range since starting testosterone ?            Post prandial: ?            Evening: ?            Before meals: ?Taking Insulin?: no ?            Long acting insulin: ?            Short acting insulin: ?Blood Pressure Monitoring: daily ?Retinal Examination: Not Up to Date -- [facility] --  scheduled at end of May ?Foot Exam: Up to Date ?Pneumovax: refused ?Influenza: refused ?Aspirin: yes  ?  ?HYPERTENSION / HYPERLIPIDEMIA ?Continues on Lisinopril 20 MG and Amlodipine 5 MG + Crestor 20 MG daily.  ?Satisfied with current treatment? yes ?Duration of hypertension: chronic ?BP monitoring frequency: occasionally ?BP range: 120-130/70's at home on average ?BP medication side effects: no ?Duration of hyperlipidemia: chronic ?Cholesterol medication side effects: no ?Cholesterol supplements: fish oil ?Medication compliance: good compliance ?Aspirin: yes ?Recent  stressors: no ?Recurrent headaches: no ?Visual changes: no ?Palpitations: no ?Dyspnea: no ?Chest pain: no ?Lower extremity edema: no ?Dizzy/lightheaded: no  ? ? ?  07/03/2021  ?  8:08 AM 04/03/2021  ?  8:29 AM 12/06/2020  ?  1:02 PM 10/03/2020  ?  8:55 AM 12/04/2019  ? 10:35 AM  ?Depression screen PHQ 2/9  ?Decreased Interest 0 0 0 0 0  ?Down, Depressed, Hopeless 0 0 0 0 0  ?PHQ - 2 Score 0 0 0 0 0  ?Altered sleeping 0 0     ?Tired, decreased energy 3 3     ?Change in appetite 0 0     ?Feeling bad or failure about yourself  0 0     ?Trouble concentrating 0 0     ?Moving slowly or fidgety/restless 0 2     ?Suicidal thoughts 0 0     ?PHQ-9 Score 3 5     ?Difficult doing work/chores  Not difficult at all     ?  ? ?  07/03/2021  ?  8:09 AM 04/03/2021  ?  8:29 AM  ?GAD 7 : Generalized Anxiety Score  ?Nervous, Anxious, on Edge 0 0  ?  Control/stop worrying 0 0  ?Worry too much - different things 0 0  ?Trouble relaxing 0 0  ?Restless 0 0  ?Easily annoyed or irritable 1 2  ?Afraid - awful might happen 0 0  ?Total GAD 7 Score 1 2  ?Anxiety Difficulty Not difficult at all Not difficult at all  ? ?Relevant past medical, surgical, family and social history reviewed and updated as indicated. Interim medical history since our last visit reviewed. ?Allergies and medications reviewed and updated. ? ?Review of Systems  ?Constitutional:  Negative for activity change, diaphoresis, fatigue and fever.  ?Respiratory:  Negative for cough, chest tightness, shortness of breath and wheezing.   ?Cardiovascular:  Negative for chest pain, palpitations and leg swelling.  ?Gastrointestinal: Negative.   ?Endocrine: Negative for cold intolerance, heat intolerance, polydipsia, polyphagia and polyuria.  ?Neurological: Negative.   ?Psychiatric/Behavioral: Negative.    ? ?Per HPI unless specifically indicated above ? ?   ?Objective:  ?  ?BP 119/71   Pulse (!) 58   Temp 97.7 ?F (36.5 ?C) (Oral)   Ht 5\' 6"  (1.676 m)   Wt 180 lb 6.4 oz (81.8 kg)   SpO2 97%    BMI 29.12 kg/m?   ?Wt Readings from Last 3 Encounters:  ?07/03/21 180 lb 6.4 oz (81.8 kg)  ?05/06/21 165 lb (74.8 kg)  ?04/24/21 165 lb (74.8 kg)  ?  ?Physical Exam ?Vitals and nursing note reviewed.  ?Constitutional:   ?   General: He is awake. He is not in acute distress. ?   Appearance: He is well-developed. He is not ill-appearing.  ?HENT:  ?   Head: Normocephalic and atraumatic.  ?   Right Ear: Hearing normal. No drainage.  ?   Left Ear: Hearing normal. No drainage.  ?Eyes:  ?   General: Lids are normal.     ?   Right eye: No discharge.     ?   Left eye: No discharge.  ?   Conjunctiva/sclera: Conjunctivae normal.  ?   Pupils: Pupils are equal, round, and reactive to light.  ?Neck:  ?   Thyroid: No thyromegaly.  ?   Vascular: No carotid bruit.  ?Cardiovascular:  ?   Rate and Rhythm: Normal rate and regular rhythm.  ?   Heart sounds: Normal heart sounds, S1 normal and S2 normal. No murmur heard. ?  No gallop.  ?Pulmonary:  ?   Effort: Pulmonary effort is normal. No accessory muscle usage or respiratory distress.  ?   Breath sounds: Normal breath sounds.  ?Abdominal:  ?   General: Bowel sounds are normal.  ?   Palpations: Abdomen is soft.  ?Musculoskeletal:     ?   General: Normal range of motion.  ?   Cervical back: Normal range of motion and neck supple.  ?   Right lower leg: No edema.  ?   Left lower leg: No edema.  ?Skin: ?   General: Skin is warm and dry.  ?   Capillary Refill: Capillary refill takes less than 2 seconds.  ?Neurological:  ?   Mental Status: He is alert and oriented to person, place, and time.  ?   Deep Tendon Reflexes: Reflexes are normal and symmetric.  ?Psychiatric:     ?   Attention and Perception: Attention normal.     ?   Mood and Affect: Mood normal.     ?   Behavior: Behavior normal. Behavior is cooperative.     ?   Thought Content: Thought  content normal.  ? ?Results for orders placed or performed in visit on 06/24/21  ?Testosterone  ?Result Value Ref Range  ? Testosterone 251 (L)  264 - 916 ng/dL  ? ?   ?Assessment & Plan:  ? ?Problem List Items Addressed This Visit   ? ?  ? Cardiovascular and Mediastinum  ? Hypertension associated with diabetes (HCC)  ?  Chronic, ongoing with BP at goal in office and on home checks. Continue Lisinopril 20 MG and Amlodipine 5 MG daily -- refills up to date. Have advised he continue using Meloxicam minimally and discussed risks with consistent use.  Monitor BP at home daily and focus on DASH diet.  Labs CMP and urine ALB today.   Return in 3 months for T2DM + HTN/HLD.   ?  ?  ? Relevant Orders  ? Bayer DCA Hb A1c Waived  ? Microalbumin, Urine Waived  ? Comprehensive metabolic panel  ?  ? Endocrine  ? Hyperlipidemia associated with type 2 diabetes mellitus (HCC)  ?  Chronic, ongoing.  Continue current medication regimen and adjust as needed.  Lipid panel today. ?  ?  ? Relevant Orders  ? Bayer DCA Hb A1c Waived  ? Comprehensive metabolic panel  ? Lipid Panel w/o Chol/HDL Ratio  ? Type 2 diabetes mellitus with diabetic neuropathy, without long-term current use of insulin (HCC) - Primary  ?  Chronic, ongoing.  A1C 6.3% today, downward from previous 7.1% -- often is in 6 range and stable.  Urine ALB 30 and A:C 30-300 in March 2023, will continue Lisinopril for kidney protection.  Continue current medication regimen and adjust as needed.  Recommend continued focus on daily exercise, which he has started again, and healthy diet.  Return in 3 months for T2DM.  Could consider addition of Farxiga or Jardiance if elevations in future. ?  ?  ? Relevant Orders  ? Bayer DCA Hb A1c Waived  ? Microalbumin, Urine Waived  ? Comprehensive metabolic panel  ?  ? Other  ? BMI 28.0-28.9,adult  ?  BMI 29.12, some gain present with starting testosterone.  Recommended eating smaller high protein, low fat meals more frequently and exercising 30 mins a day 5 times a week with a goal of 10-15lb weight loss in the next 3 months. Patient voiced their understanding and motivation to adhere  to these recommendations. ?  ?  ? Low testosterone in male  ?  Continue collaboration with urology and current treatment as ordered by them. ?  ?  ?  ? ?Follow up plan: ?Return in about 3 months (around 10/06/2021) for

## 2021-07-04 LAB — COMPREHENSIVE METABOLIC PANEL
ALT: 21 IU/L (ref 0–44)
AST: 23 IU/L (ref 0–40)
Albumin/Globulin Ratio: 2.1 (ref 1.2–2.2)
Albumin: 4.8 g/dL (ref 3.8–4.8)
Alkaline Phosphatase: 84 IU/L (ref 44–121)
BUN/Creatinine Ratio: 11 (ref 10–24)
BUN: 11 mg/dL (ref 8–27)
Bilirubin Total: 0.5 mg/dL (ref 0.0–1.2)
CO2: 26 mmol/L (ref 20–29)
Calcium: 9.7 mg/dL (ref 8.6–10.2)
Chloride: 100 mmol/L (ref 96–106)
Creatinine, Ser: 1.03 mg/dL (ref 0.76–1.27)
Globulin, Total: 2.3 g/dL (ref 1.5–4.5)
Glucose: 130 mg/dL — ABNORMAL HIGH (ref 70–99)
Potassium: 4.6 mmol/L (ref 3.5–5.2)
Sodium: 140 mmol/L (ref 134–144)
Total Protein: 7.1 g/dL (ref 6.0–8.5)
eGFR: 79 mL/min/{1.73_m2} (ref >59)

## 2021-07-04 LAB — LIPID PANEL W/O CHOL/HDL RATIO
Cholesterol, Total: 90 mg/dL — ABNORMAL LOW (ref 100–199)
HDL: 38 mg/dL — ABNORMAL LOW (ref >39)
LDL Chol Calc (NIH): 32 mg/dL (ref 0–99)
Triglycerides: 108 mg/dL (ref 0–149)
VLDL Cholesterol Cal: 20 mg/dL (ref 5–40)

## 2021-07-04 NOTE — Progress Notes (Signed)
Good morning, please let Rafi know his labs have returned and everything remains nice and stable.  No changes to medications needed.  Keep up the good work!! ?Keep being amazing!!  Thank you for allowing me to participate in your care.  I appreciate you. ?Kindest regards, ?Deny Chevez ?

## 2021-07-24 ENCOUNTER — Other Ambulatory Visit: Payer: Medicare Other

## 2021-07-24 DIAGNOSIS — E349 Endocrine disorder, unspecified: Secondary | ICD-10-CM | POA: Diagnosis not present

## 2021-07-25 ENCOUNTER — Other Ambulatory Visit: Payer: Self-pay | Admitting: *Deleted

## 2021-07-25 DIAGNOSIS — E349 Endocrine disorder, unspecified: Secondary | ICD-10-CM

## 2021-07-25 LAB — TESTOSTERONE: Testosterone: 1427 ng/dL — ABNORMAL HIGH (ref 264–916)

## 2021-07-28 ENCOUNTER — Telehealth: Payer: Self-pay

## 2021-07-28 ENCOUNTER — Other Ambulatory Visit
Admission: RE | Admit: 2021-07-28 | Discharge: 2021-07-28 | Disposition: A | Discharge status: Home / Self Care | Payer: Medicare Other | Attending: Urology | Admitting: Urology

## 2021-07-28 ENCOUNTER — Other Ambulatory Visit: Payer: Self-pay | Admitting: Urology

## 2021-07-28 DIAGNOSIS — E349 Endocrine disorder, unspecified: Secondary | ICD-10-CM | POA: Diagnosis not present

## 2021-07-28 NOTE — Telephone Encounter (Signed)
Pt called triage line states that he is at the pharmacy now and would like to know when his RX for Testosterone will be filled. Advised pt that he had labs drawn today and RX will likely be sent pending results. Please advise on refill.  ?

## 2021-07-29 LAB — TESTOSTERONE: Testosterone: 884 ng/dL (ref 264–916)

## 2021-08-04 ENCOUNTER — Other Ambulatory Visit: Payer: Self-pay | Admitting: Urology

## 2021-08-04 DIAGNOSIS — E349 Endocrine disorder, unspecified: Secondary | ICD-10-CM

## 2021-08-04 MED ORDER — TESTOSTERONE CYPIONATE 200 MG/ML IM SOLN: 100.0000 mg | INTRAMUSCULAR | 0 refills | Status: DC

## 2021-08-04 NOTE — Progress Notes (Signed)
Prescription sent in for testosterone cypionate to be taken 0.5 cc every 14 days. ?

## 2021-08-05 ENCOUNTER — Telehealth: Payer: Self-pay

## 2021-08-05 NOTE — Telephone Encounter (Signed)
-----   Message from Harle Battiest, PA-C sent at 08/04/2021 10:41 AM EDT ----- ?His testosterone is still on the high side, so we will need to reduce his injections to 0.5 cc every 14 days.  And can we ask more questions regarding his aches and pains?  What is he referring to? ? ? ?----- Message ----- ?From: Sueanne Margarita, CMA ?Sent: 08/01/2021   1:23 PM EDT ?To: Harle Battiest, PA-C ? ?Spoke with patient and lab/OV scheduled. Pt states his aches and pains are back with him not having his shot in over a week, pt requesting refill. ? ?

## 2021-08-05 NOTE — Telephone Encounter (Signed)
Patient notified to reduce injections. He thinks the pain is coming from the Neuropathy in his feet. He gave himself a shot this morning and he states his feet feel a lot better right now. He states he just wanted you to know.  ?

## 2021-08-05 NOTE — Telephone Encounter (Signed)
LMOM for pt to return call. 

## 2021-08-14 ENCOUNTER — Other Ambulatory Visit: Payer: Medicare Other

## 2021-09-09 DIAGNOSIS — E119 Type 2 diabetes mellitus without complications: Secondary | ICD-10-CM | POA: Diagnosis not present

## 2021-09-09 DIAGNOSIS — H2513 Age-related nuclear cataract, bilateral: Secondary | ICD-10-CM | POA: Diagnosis not present

## 2021-09-09 DIAGNOSIS — I10 Essential (primary) hypertension: Secondary | ICD-10-CM | POA: Diagnosis not present

## 2021-09-09 DIAGNOSIS — H524 Presbyopia: Secondary | ICD-10-CM | POA: Diagnosis not present

## 2021-09-09 LAB — HM DIABETES EYE EXAM

## 2021-10-05 NOTE — Patient Instructions (Signed)

## 2021-10-08 ENCOUNTER — Ambulatory Visit (INDEPENDENT_AMBULATORY_CARE_PROVIDER_SITE_OTHER): Payer: Medicare Other | Admitting: Nurse Practitioner

## 2021-10-08 ENCOUNTER — Encounter: Payer: Self-pay | Admitting: Nurse Practitioner

## 2021-10-08 VITALS — BP 115/65 | HR 62 | Temp 98.2°F | Ht 66.5 in | Wt 175.0 lb

## 2021-10-08 DIAGNOSIS — Z0000 Encounter for general adult medical examination without abnormal findings: Secondary | ICD-10-CM | POA: Diagnosis not present

## 2021-10-08 DIAGNOSIS — K219 Gastro-esophageal reflux disease without esophagitis: Secondary | ICD-10-CM

## 2021-10-08 DIAGNOSIS — N5201 Erectile dysfunction due to arterial insufficiency: Secondary | ICD-10-CM

## 2021-10-08 DIAGNOSIS — Z87891 Personal history of nicotine dependence: Secondary | ICD-10-CM | POA: Diagnosis not present

## 2021-10-08 DIAGNOSIS — E1159 Type 2 diabetes mellitus with other circulatory complications: Secondary | ICD-10-CM | POA: Diagnosis not present

## 2021-10-08 DIAGNOSIS — Z6828 Body mass index (BMI) 28.0-28.9, adult: Secondary | ICD-10-CM

## 2021-10-08 DIAGNOSIS — E1140 Type 2 diabetes mellitus with diabetic neuropathy, unspecified: Secondary | ICD-10-CM

## 2021-10-08 DIAGNOSIS — E785 Hyperlipidemia, unspecified: Secondary | ICD-10-CM | POA: Diagnosis not present

## 2021-10-08 DIAGNOSIS — E1169 Type 2 diabetes mellitus with other specified complication: Secondary | ICD-10-CM

## 2021-10-08 DIAGNOSIS — I152 Hypertension secondary to endocrine disorders: Secondary | ICD-10-CM

## 2021-10-08 DIAGNOSIS — R7989 Other specified abnormal findings of blood chemistry: Secondary | ICD-10-CM

## 2021-10-08 DIAGNOSIS — K222 Esophageal obstruction: Secondary | ICD-10-CM

## 2021-10-08 LAB — BAYER DCA HB A1C WAIVED: HB A1C (BAYER DCA - WAIVED): 6.6 % — ABNORMAL HIGH (ref 4.8–5.6)

## 2021-10-08 NOTE — Assessment & Plan Note (Signed)
Chronic, ongoing with BP at goal in office and on home checks. Continue current medication regimen and adjust as needed. Have advised he continue using Meloxicam minimally and discussed risks with consistent use.  Monitor BP at home daily and focus on DASH diet.  Labs CMP today.   Return in 6 months.

## 2021-10-08 NOTE — Assessment & Plan Note (Signed)
Chronic, stable without medication. Uses Magnesium BID. Check Mag level today and adjust as needed.

## 2021-10-08 NOTE — Progress Notes (Addendum)
BP 115/65   Pulse 62   Temp 98.2 F (36.8 C) (Oral)   Ht 5' 6.5" (1.689 m)   Wt 175 lb (79.4 kg)   SpO2 96%   BMI 27.82 kg/m    Subjective:    Patient ID: Lawrence Jensen, male    DOB: [birth year], 71 y.o.   MRN: [id number]  NOTE WRITTEN BY FNP STUDENT.  ASSESSMENT AND PLAN OF CARE REVIEWED WITH STUDENT, AGREE WITH ABOVE FINDINGS AND PLAN.   HPI: Lawrence Jensen is a 71 y.o. male presenting on 10/08/2021 for comprehensive medical examination. Current medical complaints include: numbness  He currently lives with: wife Interim Problems from his last visit: no  DIABETES Continues on Metformin twice a day.  Last A1c 6.3%.  Hypoglycemic episodes:no Polydipsia/polyuria: no Visual disturbance: no Chest pain: no Paresthesias: yes Glucose Monitoring: yes  Accucheck frequency: Daily  Fasting glucose: 130-140  Post prandial:  Evening:  Before meals: Taking Insulin?: no  Long acting insulin:  Short acting insulin: Blood Pressure Monitoring: a few times a week Retinal Examination: Up to Date Foot Exam: Up to date Diabetic Education: Completed Pneumovax:  refused Influenza: Up to Date Aspirin: no   HYPERTENSION / HYPERLIPIDEMIA Continues on Amlodipine and Lisinopril + Crestor. Satisfied with current treatment? yes Duration of hypertension: chronic BP monitoring frequency: a few times a week BP range: 120-130 BP medication side effects: no Duration of hyperlipidemia: years Cholesterol medication side effects: no Cholesterol supplements: none Medication compliance: good compliance Aspirin: no Recent stressors: no Recurrent headaches: no Visual changes: no Palpitations: no Dyspnea: no Chest pain: no Lower extremity edema: no Dizzy/lightheaded: no   Functional Status Survey: Is the patient deaf or have difficulty hearing?: No Does the patient have difficulty seeing, even when wearing glasses/contacts?: No Does the patient have difficulty concentrating,  remembering, or making decisions?: No Does the patient have difficulty walking or climbing stairs?: No Does the patient have difficulty dressing or bathing?: No Does the patient have difficulty doing errands alone such as visiting a doctor's office or shopping?: No  FALL RISK:    12/06/2020    1:02 PM 12/04/2019   10:34 AM 09/06/2019    7:54 AM 11/28/2018    3:59 PM 11/17/2017    3:55 PM  [place] in the past year? 0 0 0 0 No  Number falls in past yr:   0    Injury with Fall?   0    Risk for fall due to : Medication side effect Medication side effect     Follow up Falls evaluation completed;Education provided;Falls prevention discussed Falls evaluation completed;Education provided;Falls prevention discussed Falls evaluation completed      Depression Screen    10/08/2021    8:29 AM 07/03/2021    8:08 AM 04/03/2021    8:29 AM 12/06/2020    1:02 PM 10/03/2020    8:55 AM  Depression screen PHQ 2/9  Decreased Interest 1 0 0 0 0  Down, Depressed, Hopeless 0 0 0 0 0  PHQ - 2 Score 1 0 0 0 0  Altered sleeping 0 0 0    Tired, decreased energy _0 Change in appetite 0 0 0    Feeling bad or failure about yourself  0 0 0    Trouble concentrating 0 0 0    Moving slowly or fidgety/restless 0 0 2    Suicidal thoughts 0 0 0    PHQ-9 Score 4 3  5    Difficult doing work/chores Extremely dIfficult  Not difficult at all      Advanced Directives <no information>  Past Medical History:  Past Medical History:  Diagnosis Date   Diabetes mellitus without complication ([place])    ED (erectile dysfunction)    Hyperlipidemia    Hypertension     Surgical History:  Past Surgical History:  Procedure Laterality Date   APPENDECTOMY  04/13/1997   TOOTH EXTRACTION      Medications:  Current Outpatient Medications on File Prior to Visit  Medication Sig   ACCU-CHEK AVIVA PLUS test strip USE AS INSTRUCTED   amLODipine (NORVASC) 5 MG tablet Take 1 tablet (5 mg total) by mouth daily.    aspirin EC 81 MG tablet Take 81 mg by mouth daily.   Blood Glucose Calibration (OT ULTRA/FASTTK CNTRL SOLN) SOLN    Blood Glucose Monitoring Suppl (ONE TOUCH ULTRA 2) w/Device KIT Inject 1 kit into the skin 3 (three) times daily.   COD LIVER OIL PO Take 3 tablets by mouth daily.   lisinopril (ZESTRIL) 20 MG tablet Take 1 tablet (20 mg total) by mouth daily.   Magnesium 400 MG TABS Take 400 mg by mouth 2 (two) times daily.   meloxicam (MOBIC) 15 MG tablet Take 1 tablet (15 mg total) by mouth daily.   metFORMIN (GLUCOPHAGE) 500 MG tablet Take 2 tablets (1,000 mg total) by mouth 2 (two) times daily with a meal.   NEEDLE, DISP, 18 G (BD DISP NEEDLES) 18G X 1-1/2" MISC 1 mg by Does not apply route once a week.   NEEDLE, DISP, 21 G (BD DISP NEEDLES) 21G X 1-1/2" MISC 1 mg by Does not apply route once a week.   rosuvastatin (CRESTOR) 20 MG tablet Take 1 tablet (20 mg total) by mouth daily.   Syringe, Disposable, (2-3CC SYRINGE) 3 ML MISC 1 mg by Does not apply route once a week.   testosterone cypionate (DEPOTESTOSTERONE CYPIONATE) 200 MG/ML injection Inject 0.5 mLs (100 mg total) into the muscle every 14 (fourteen) days.   No current facility-administered medications on file prior to visit.    Allergies:  No Known Allergies  Social History:  Social History   Socioeconomic History   Marital status: Married    Spouse name: Not on file   Number of children: Not on file   Years of education: Not on file   Highest education level: Associate degree: academic program  Occupational History   Occupation: retired  Tobacco Use   Smoking status: Former    Packs/day: 1.00    Years: 35.00    Total pack years: 35.00    Types: Cigarettes    Quit date: 04/03/1995    Years since quitting: 26.5   Smokeless tobacco: Never  Vaping Use   Vaping Use: Never used  Substance and Sexual Activity   Alcohol use: No   Drug use: No   Sexual activity: Not Currently  Other Topics Concern   Not on file   Social History Narrative   Not on file   Social Determinants of Health   Financial Resource Strain: Low Risk  (12/06/2020)   Overall Financial Resource Strain (CARDIA)    Difficulty of Paying Living Expenses: Not hard at all  Food Insecurity: No [place] (12/06/2020)   Hunger Vital Sign    Worried About Running Out of Food in the Last Year: Never true    [place] in the Last Year: Never true  Transportation  Needs: No Transportation Needs (12/06/2020)   PRAPARE - Hydrologist (Medical): No    Lack of Transportation (Non-Medical): No  Physical Activity: Insufficiently Active (12/06/2020)   Exercise Vital Sign    Days of Exercise per Week: 3 days    Minutes of Exercise per Session: 30 min  Stress: No Stress Concern Present (12/06/2020)   [place]    Feeling of Stress : Not at all  Social Connections: Socially Integrated (10/03/2020)   Social Connection and Isolation Panel [NHANES]    Frequency of Communication with Friends and Family: More than three times a week    Frequency of Social Gatherings with Friends and Family: More than three times a week    Attends Religious Services: 1 to 4 times per year    Active Member of [employer] or Organizations: Yes    Attends Archivist Meetings: 1 to 4 times per year    Marital Status: Married  Human resources officer Violence: Not At Risk (10/03/2020)   Humiliation, Afraid, Rape, and Kick questionnaire    Fear of Current or Ex-Partner: No    Emotionally Abused: No    Physically Abused: No    Sexually Abused: No   Social History   Tobacco Use  Smoking Status Former   Packs/day: 1.00   Years: 35.00   Total pack years: 35.00   Types: Cigarettes   Quit date: 04/03/1995   Years since quitting: 26.5  Smokeless Tobacco Never   Social History   Substance and Sexual Activity  Alcohol Use No    Family History:  Family History   Problem Relation Age of Onset   Heart disease Mother    Heart disease Father    Congestive Heart Failure Father    Cancer Father        prostate   Diabetes Father    Stroke Father    Hypertension Father    Cancer Brother    Stroke Maternal Grandmother    Heart disease Brother    COPD Neg Hx     Past medical history, surgical history, medications, allergies, family history and social history reviewed with patient today and changes made to appropriate areas of the chart.   ROS All other ROS negative except what is listed above and in the HPI.      Objective:    BP 115/65   Pulse 62   Temp 98.2 F (36.8 C) (Oral)   Ht 5' 6.5" (1.689 m)   Wt 175 lb (79.4 kg)   SpO2 96%   BMI 27.82 kg/m   Wt Readings from Last 3 Encounters:  10/08/21 175 lb (79.4 kg)  07/03/21 180 lb 6.4 oz (81.8 kg)  05/06/21 165 lb (74.8 kg)    Physical Exam Vitals and nursing note reviewed.  Constitutional:      General: He is awake. He is not in acute distress.    Appearance: Normal appearance. He is well-developed. He is not ill-appearing or toxic-appearing.  HENT:     Head: Normocephalic and atraumatic.     Right Ear: Hearing, tympanic membrane, ear canal and external ear normal. No drainage.     Left Ear: Hearing, tympanic membrane, ear canal and external ear normal. No drainage.     Nose: Nose normal.  Eyes:     General: Lids are normal.        Right eye: No discharge.        Left  eye: No discharge.     Conjunctiva/sclera: Conjunctivae normal.     Pupils: Pupils are equal, round, and reactive to light.  Neck:     Thyroid: No thyromegaly or thyroid tenderness.     Vascular: No carotid bruit.  Cardiovascular:     Rate and Rhythm: Normal rate and regular rhythm.     Pulses: Normal pulses.     Heart sounds: Normal heart sounds, S1 normal and S2 normal. No murmur heard.    No gallop.  Pulmonary:     Effort: Pulmonary effort is normal. No accessory muscle usage or respiratory distress.      Breath sounds: Normal breath sounds.  Abdominal:     General: Bowel sounds are normal. There is no distension.     Palpations: Abdomen is soft.     Tenderness: There is no abdominal tenderness. There is no right CVA tenderness or left CVA tenderness.  Musculoskeletal:        General: Normal range of motion.     Cervical back: Normal range of motion and neck supple.     Right lower leg: No edema.     Left lower leg: No edema.  Feet:     Right foot:     Skin integrity: Skin integrity normal. No ulcer or blister.     Left foot:     Skin integrity: Skin integrity normal. No ulcer or blister.  Lymphadenopathy:     Head:     Right side of head: No submental, submandibular, tonsillar, preauricular or posterior auricular adenopathy.     Left side of head: No submental, submandibular, tonsillar, preauricular or posterior auricular adenopathy.     Cervical: No cervical adenopathy.  Skin:    General: Skin is warm and dry.     Capillary Refill: Capillary refill takes less than 2 seconds.  Neurological:     General: No focal deficit present.     Mental Status: He is alert and oriented to person, place, and time.     Motor: Motor function is intact.     Coordination: Coordination is intact.     Gait: Gait is intact.     Deep Tendon Reflexes: Reflexes are normal and symmetric.  Psychiatric:        Attention and Perception: Attention and perception normal.        Mood and Affect: Mood and affect normal.        Speech: Speech normal.        Behavior: Behavior normal. Behavior is cooperative.        Thought Content: Thought content normal.        Cognition and Memory: Cognition normal.        Judgment: Judgment normal.        12/06/2020    1:04 PM 10/03/2020    9:24 AM 12/04/2019   10:36 AM 09/06/2019    8:19 AM 11/17/2017    3:55 PM  6CIT Screen  What Year? 0 points 0 points 0 points 0 points 0 points  What month? 0 points 0 points 0 points 0 points 0 points  What time? 0 points 0 points  0 points 0 points 0 points  Count back from 20 0 points 0 points 0 points 0 points 0 points  Months in reverse 0 points 0 points 0 points 0 points 0 points  Repeat phrase 4 points 0 points 0 points 0 points 0 points  Total Score 4 points 0 points 0 points 0 points 0 points  Results for orders placed or performed in visit on 10/08/21  Bayer DCA Hb A1c Waived  Result Value Ref Range   HB A1C (BAYER DCA - WAIVED) 6.6 (H) 4.8 - 5.6 %      Assessment & Plan:   Problem List Items Addressed This Visit       Cardiovascular and Mediastinum   Erectile dysfunction due to arterial insufficiency    Chronic, stable. Continue good control of T2DM and HTN. Check PSA and today.      Hypertension associated with diabetes ([place])    Chronic, ongoing with BP at goal in office and on home checks. Continue current medication regimen and adjust as needed. Have advised he continue using Meloxicam minimally and discussed risks with consistent use.  Monitor BP at home daily and focus on DASH diet.  Labs CMP today.   Return in 6 months.      Relevant Orders   Bayer DCA Hb A1c Waived (Completed)   CBC with Differential/Platelet   Comprehensive metabolic panel   TSH     Digestive   GERD with stricture    Chronic, stable without medication. Uses Magnesium BID. Check Mag level today and adjust as needed.      Relevant Orders   Magnesium     Endocrine   Hyperlipidemia associated with type 2 diabetes mellitus (HCC)    Chronic, ongoing. Continue current medication medication regimen and adjust as needed. Lipid panel today.       Relevant Orders   Bayer DCA Hb A1c Waived (Completed)   Comprehensive metabolic panel   Lipid Panel w/o Chol/HDL Ratio   Type 2 diabetes mellitus with diabetic neuropathy, without long-term current use of insulin (HCC) - Primary    Chronic, ongoing.  A1C 6.3% last visit, recheck today. Continue Lisinopril for kidney protection.  Continue current medication regimen and adjust  as needed.  Recommend continued focus on daily exercise, which he has started again, and healthy diet.  Return in 6 months for T2DM.       Relevant Orders   Bayer DCA Hb A1c Waived (Completed)     Other   BMI 28.0-28.9,adult    BMI 27.82. Recommended eating smaller high protein, low fat meals more frequently and exercising 30 mins a day 5 times a week with a goal of 10-15lb weight loss in the next 3 months. Patient voiced their understanding and motivation to adhere to these recommendations.       History of smoking    Recommend to continue cessation.       Low testosterone in male    Continue collaboration with urology and current treatment per urology.       Relevant Orders   PSA   Other Visit Diagnoses     Encounter for annual physical exam       Annual physical with labs today and health maintenance reviewed with patient.       Discussed aspirin prophylaxis for myocardial infarction prevention and decision was made to continue ASA  LABORATORY TESTING:  Health maintenance labs ordered today as discussed above.   The natural history of prostate cancer and ongoing controversy regarding screening and potential treatment outcomes of prostate cancer has been discussed with the patient. The meaning of a false positive PSA and a false negative PSA has been discussed. He indicates understanding of the limitations of this screening test and wishes to proceed with screening PSA testing.   IMMUNIZATIONS:   - Tdap: Tetanus vaccination status reviewed: last  tetanus booster within 10 years. - Influenza: Refused - Pneumovax: Refused - Prevnar: Refused - Zostavax vaccine: Refused  SCREENING: - Colonoscopy: Refused  Discussed with patient purpose of the colonoscopy is to detect colon cancer at curable precancerous or early stages   - AAA Screening: Up to date -Hearing Test: Not applicable  -Spirometry: Not applicable   PATIENT COUNSELING:    Sexuality: Discussed sexually  transmitted diseases, partner selection, use of condoms, avoidance of unintended pregnancy  and contraceptive alternatives.   Advised to avoid cigarette smoking.  I discussed with the patient that most people either abstain from alcohol or drink within safe limits (<=14/week and <=4 drinks/occasion for males, <=7/weeks and <= 3 drinks/occasion for females) and that the risk for alcohol disorders and other health effects rises proportionally with the number of drinks per week and how often a drinker exceeds daily limits.  Discussed cessation/primary prevention of drug use and availability of treatment for abuse.   Diet: Encouraged to adjust caloric intake to maintain  or achieve ideal body weight, to reduce intake of dietary saturated fat and total fat, to limit sodium intake by avoiding high sodium foods and not adding table salt, and to maintain adequate dietary potassium and calcium preferably from fresh fruits, vegetables, and low-fat dairy products.    Stressed the importance of regular exercise  Injury prevention: Discussed safety belts, safety helmets, smoke detector, smoking near bedding or upholstery.   Dental health: Discussed importance of regular tooth brushing, flossing, and dental visits.   Follow up plan: NEXT PREVENTATIVE PHYSICAL DUE IN 1 YEAR. Return in about 6 months (around 04/09/2022) for T2DM, HTN/HLD.

## 2021-10-08 NOTE — Assessment & Plan Note (Signed)
Recommend to continue cessation.

## 2021-10-08 NOTE — Assessment & Plan Note (Signed)
Chronic, stable. Continue good control of T2DM and HTN. Check PSA and today.

## 2021-10-08 NOTE — Assessment & Plan Note (Signed)
BMI 27.82. Recommended eating smaller high protein, low fat meals more frequently and exercising 30 mins a day 5 times a week with a goal of 10-15lb weight loss in the next 3 months. Patient voiced their understanding and motivation to adhere to these recommendations.

## 2021-10-08 NOTE — Assessment & Plan Note (Signed)
Chronic, ongoing.  A1C 6.3% last visit, recheck today. Continue Lisinopril for kidney protection.  Continue current medication regimen and adjust as needed.  Recommend continued focus on daily exercise, which he has started again, and healthy diet.  Return in 6 months for T2DM.

## 2021-10-08 NOTE — Assessment & Plan Note (Signed)
Continue collaboration with urology and current treatment per urology.

## 2021-10-08 NOTE — Progress Notes (Deleted)
BP 115/65   Pulse 62   Temp 98.2 F (36.8 C) (Oral)   Ht 5' 6.5" (1.689 m)   Wt 175 lb (79.4 kg)   SpO2 96%   BMI 27.82 kg/m    Subjective:    Patient ID: Lawrence Jensen, male    DOB: Jun 06, 1950, 71 y.o.   MRN: [id number]  HPI: Lawrence Jensen is a 71 y.o. male presenting on 10/08/2021 for comprehensive medical examination. Current medical complaints include:{Blank single:19197::"none","***"}  He currently lives with: wife Interim Problems from his last visit: {Blank single:19197::"yes","no"}  DIABETES Continues on Metformin twice a day.  Last A1c 6.3%. Hypoglycemic episodes:{Blank single:19197::"yes","no"} Polydipsia/polyuria: {Blank single:19197::"yes","no"} Visual disturbance: {Blank single:19197::"yes","no"} Chest pain: {Blank single:19197::"yes","no"} Paresthesias: {Blank single:19197::"yes","no"} Glucose Monitoring: {Blank single:19197::"yes","no"}  Accucheck frequency: {Blank single:19197::"Not Checking","Daily","BID","TID"}  Fasting glucose:  Post prandial:  Evening:  Before meals: Taking Insulin?: {Blank single:19197::"yes","no"}  Long acting insulin:  Short acting insulin: Blood Pressure Monitoring: {Blank single:19197::"not checking","rarely","daily","weekly","monthly","a few times a day","a few times a week","a few times a month"} Retinal Examination: {Blank single:19197::"Up to Date","Not up to Date"} Foot Exam: {Blank single:19197::"Up to Date","Not up to Date"} Diabetic Education: {Blank single:19197::"Completed","Not Completed"} Pneumovax: {Blank single:19197::"Up to Date","Not up to Date","unknown"} Influenza: {Blank single:19197::"Up to Date","Not up to Date","unknown"} Aspirin: {Blank single:19197::"yes","no"}   HYPERTENSION / HYPERLIPIDEMIA Continues on Amlodipine and Lisinopril + Crestor. Satisfied with current treatment? {Blank single:19197::"yes","no"} Duration of hypertension: {Blank single:19197::"chronic","months","years"} BP  monitoring frequency: {Blank single:19197::"not checking","rarely","daily","weekly","monthly","a few times a day","a few times a week","a few times a month"} BP range:  BP medication side effects: {Blank single:19197::"yes","no"} Duration of hyperlipidemia: {Blank single:19197::"chronic","months","years"} Cholesterol medication side effects: {Blank single:19197::"yes","no"} Cholesterol supplements: {Blank multiple:19196::"none","fish oil","niacin","red yeast rice"} Medication compliance: {Blank single:19197::"excellent compliance","good compliance","fair compliance","poor compliance"} Aspirin: {Blank single:19197::"yes","no"} Recent stressors: {Blank single:19197::"yes","no"} Recurrent headaches: {Blank single:19197::"yes","no"} Visual changes: {Blank single:19197::"yes","no"} Palpitations: {Blank single:19197::"yes","no"} Dyspnea: {Blank single:19197::"yes","no"} Chest pain: {Blank single:19197::"yes","no"} Lower extremity edema: {Blank single:19197::"yes","no"} Dizzy/lightheaded: {Blank single:19197::"yes","no"}   Functional Status Survey: Is the patient deaf or have difficulty hearing?: No Does the patient have difficulty seeing, even when wearing glasses/contacts?: No Does the patient have difficulty concentrating, remembering, or making decisions?: No Does the patient have difficulty walking or climbing stairs?: No Does the patient have difficulty dressing or bathing?: No Does the patient have difficulty doing errands alone such as visiting a doctor's office or shopping?: No  FALL RISK:    12/06/2020    1:02 PM 12/04/2019   10:34 AM 09/06/2019    7:54 AM 11/28/2018    3:59 PM 11/17/2017    3:55 PM  [place] in the past year? 0 0 0 0 No  Number falls in past yr:   0    Injury with Fall?   0    Risk for fall due to : Medication side effect Medication side effect     Follow up Falls evaluation completed;Education provided;Falls prevention discussed Falls evaluation  completed;Education provided;Falls prevention discussed Falls evaluation completed      Depression Screen    07/03/2021    8:08 AM 04/03/2021    8:29 AM 12/06/2020    1:02 PM 10/03/2020    8:55 AM 12/04/2019   10:35 AM  Depression screen PHQ 2/9  Decreased Interest 0 0 0 0 0  Down, Depressed, Hopeless 0 0 0 0 0  PHQ - 2 Score 0 0 0 0 0  Altered sleeping 0 0     Tired, decreased energy 3 3     Change in appetite 0  0     Feeling bad or failure about yourself  0 0     Trouble concentrating 0 0     Moving slowly or fidgety/restless 0 2     Suicidal thoughts 0 0     PHQ-9 Score 3 5     Difficult doing work/chores  Not difficult at all       Advanced Directives <no information>  Past Medical History:  Past Medical History:  Diagnosis Date   Diabetes mellitus without complication ([place])    ED (erectile dysfunction)    Hyperlipidemia    Hypertension     Surgical History:  Past Surgical History:  Procedure Laterality Date   APPENDECTOMY  04/13/1997   TOOTH EXTRACTION      Medications:  Current Outpatient Medications on File Prior to Visit  Medication Sig   ACCU-CHEK AVIVA PLUS test strip USE AS INSTRUCTED   amLODipine (NORVASC) 5 MG tablet Take 1 tablet (5 mg total) by mouth daily.   aspirin EC 81 MG tablet Take 81 mg by mouth daily.   Blood Glucose Calibration (OT ULTRA/FASTTK CNTRL SOLN) SOLN    Blood Glucose Monitoring Suppl (ONE TOUCH ULTRA 2) w/Device KIT Inject 1 kit into the skin 3 (three) times daily.   COD LIVER OIL PO Take 3 tablets by mouth daily.   lisinopril (ZESTRIL) 20 MG tablet Take 1 tablet (20 mg total) by mouth daily.   Magnesium 400 MG TABS Take 400 mg by mouth 2 (two) times daily.   meloxicam (MOBIC) 15 MG tablet Take 1 tablet (15 mg total) by mouth daily.   metFORMIN (GLUCOPHAGE) 500 MG tablet Take 2 tablets (1,000 mg total) by mouth 2 (two) times daily with a meal.   NEEDLE, DISP, 18 G (BD DISP NEEDLES) 18G X 1-1/2" MISC 1 mg by Does not apply route  once a week.   NEEDLE, DISP, 21 G (BD DISP NEEDLES) 21G X 1-1/2" MISC 1 mg by Does not apply route once a week.   rosuvastatin (CRESTOR) 20 MG tablet Take 1 tablet (20 mg total) by mouth daily.   Syringe, Disposable, (2-3CC SYRINGE) 3 ML MISC 1 mg by Does not apply route once a week.   testosterone cypionate (DEPOTESTOSTERONE CYPIONATE) 200 MG/ML injection Inject 0.5 mLs (100 mg total) into the muscle every 14 (fourteen) days.   No current facility-administered medications on file prior to visit.    Allergies:  No Known Allergies  Social History:  Social History   Socioeconomic History   Marital status: Married    Spouse name: Not on file   Number of children: Not on file   Years of education: Not on file   Highest education level: Associate degree: academic program  Occupational History   Occupation: retired  Tobacco Use   Smoking status: Former    Packs/day: 1.00    Years: 35.00    Total pack years: 35.00    Types: Cigarettes    Quit date: 04/03/1995    Years since quitting: 26.5   Smokeless tobacco: Never  Vaping Use   Vaping Use: Never used  Substance and Sexual Activity   Alcohol use: No   Drug use: No   Sexual activity: Not Currently  Other Topics Concern   Not on file  Social History Narrative   Not on file   Social Determinants of Health   Financial Resource Strain: [place]  (12/06/2020)   Overall Financial Resource Strain (CARDIA)    Difficulty of Paying Living Expenses:  Not hard at all  Food Insecurity: No Food Insecurity (12/06/2020)   Hunger Vital Sign    Worried About Running Out of Food in the Last Year: Never true    Ran Out of Food in the Last Year: Never true  Transportation Needs: No Transportation Needs (12/06/2020)   PRAPARE - Hydrologist (Medical): No    Lack of Transportation (Non-Medical): No  Physical Activity: Insufficiently Active (12/06/2020)   Exercise Vital Sign    Days of Exercise per Week: 3 days     Minutes of Exercise per Session: 30 min  Stress: No Stress Concern Present (12/06/2020)   [place]    Feeling of Stress : Not at all  Social Connections: Socially Integrated (10/03/2020)   Social Connection and Isolation Panel [NHANES]    Frequency of Communication with Friends and Family: More than three times a week    Frequency of Social Gatherings with Friends and Family: More than three times a week    Attends Religious Services: 1 to 4 times per year    Active Member of [employer] or Organizations: Yes    Attends Archivist Meetings: 1 to 4 times per year    Marital Status: Married  Human resources officer Violence: Not At Risk (10/03/2020)   Humiliation, Afraid, Rape, and Kick questionnaire    Fear of Current or Ex-Partner: No    Emotionally Abused: No    Physically Abused: No    Sexually Abused: No   Social History   Tobacco Use  Smoking Status Former   Packs/day: 1.00   Years: 35.00   Total pack years: 35.00   Types: Cigarettes   Quit date: 04/03/1995   Years since quitting: 26.5  Smokeless Tobacco Never   Social History   Substance and Sexual Activity  Alcohol Use No    Family History:  Family History  Problem Relation Age of Onset   Heart disease Mother    Heart disease Father    Congestive Heart Failure Father    Cancer Father        prostate   Diabetes Father    Stroke Father    Hypertension Father    Cancer Brother    Stroke Maternal Grandmother    Heart disease Brother    COPD Neg Hx     Past medical history, surgical history, medications, allergies, family history and social history reviewed with patient today and changes made to appropriate areas of the chart.   ROS All other ROS negative except what is listed above and in the HPI.      Objective:    BP 115/65   Pulse 62   Temp 98.2 F (36.8 C) (Oral)   Ht 5' 6.5" (1.689 m)   Wt 175 lb (79.4 kg)   SpO2 96%   BMI 27.82  kg/m   Wt Readings from Last 3 Encounters:  10/08/21 175 lb (79.4 kg)  07/03/21 180 lb 6.4 oz (81.8 kg)  05/06/21 165 lb (74.8 kg)    Physical Exam     12/06/2020    1:04 PM 10/03/2020    9:24 AM 12/04/2019   10:36 AM 09/06/2019    8:19 AM 11/17/2017    3:55 PM  6CIT Screen  What Year? 0 points 0 points 0 points 0 points 0 points  What month? 0 points 0 points 0 points 0 points 0 points  What time? 0 points 0 points 0  points 0 points 0 points  Count back from 20 0 points 0 points 0 points 0 points 0 points  Months in reverse 0 points 0 points 0 points 0 points 0 points  Repeat phrase 4 points 0 points 0 points 0 points 0 points  Total Score 4 points 0 points 0 points 0 points 0 points   Results for orders placed or performed in visit on 10/02/21  HM DIABETES EYE EXAM  Result Value Ref Range   HM Diabetic Eye Exam No Retinopathy No Retinopathy      Assessment & Plan:   Problem List Items Addressed This Visit       Cardiovascular and Mediastinum   Hypertension associated with diabetes ([place])   Relevant Orders   Bayer DCA Hb A1c Waived   CBC with Differential/Platelet   Comprehensive metabolic panel   TSH     Digestive   GERD with stricture   Relevant Orders   Magnesium     Endocrine   Hyperlipidemia associated with type 2 diabetes mellitus ([place])   Relevant Orders   Bayer DCA Hb A1c Waived   Comprehensive metabolic panel   Lipid Panel w/o Chol/HDL Ratio   Type 2 diabetes mellitus with diabetic neuropathy, without long-term current use of insulin (HCC) - Primary   Relevant Orders   Bayer DCA Hb A1c Waived     Other   BMI 28.0-28.9,adult   Low testosterone in male   Relevant Orders   PSA   Other Visit Diagnoses     Encounter for annual physical exam       Annual physical with labs today and health maintenance reviewed with patient.       Discussed aspirin prophylaxis for myocardial infarction prevention and decision was made to continue ASA  LABORATORY  TESTING:  Health maintenance labs ordered today as discussed above.   The natural history of prostate cancer and ongoing controversy regarding screening and potential treatment outcomes of prostate cancer has been discussed with the patient. The meaning of a false positive PSA and a false negative PSA has been discussed. He indicates understanding of the limitations of this screening test and wishes to proceed with screening PSA testing.   IMMUNIZATIONS:   - Tdap: Tetanus vaccination status reviewed: {tetanus status:315746}. - Influenza: {Blank single:19197::"Up to date","Administered today","Postponed to flu season","Refused","Given elsewhere"} - Pneumovax: {Blank single:19197::"Up to date","Administered today","Not applicable","Refused","Given elsewhere"} - Prevnar: {Blank single:19197::"Up to date","Administered today","Not applicable","Refused","Given elsewhere"} - Zostavax vaccine: {Blank single:19197::"Up to date","Administered today","Not applicable","Refused","Given elsewhere"}  SCREENING: - Colonoscopy: {Blank single:19197::"Up to date","Ordered today","Not applicable","Refused","Done elsewhere"}  Discussed with patient purpose of the colonoscopy is to detect colon cancer at curable precancerous or early stages   - AAA Screening: {Blank single:19197::"Up to date","Ordered today","Not applicable","Refused","Done elsewhere"}  -Hearing Test: {Blank single:19197::"Up to date","Ordered today","Not applicable","Refused","Done elsewhere"}  -Spirometry: {Blank single:19197::"Up to date","Ordered today","Not applicable","Refused","Done elsewhere"}   PATIENT COUNSELING:    Sexuality: Discussed sexually transmitted diseases, partner selection, use of condoms, avoidance of unintended pregnancy  and contraceptive alternatives.   Advised to avoid cigarette smoking.  I discussed with the patient that most people either abstain from alcohol or drink within safe limits (<=14/week and <=4  drinks/occasion for males, <=7/weeks and <= 3 drinks/occasion for females) and that the risk for alcohol disorders and other health effects rises proportionally with the number of drinks per week and how often a drinker exceeds daily limits.  Discussed cessation/primary prevention of drug use and availability of treatment for abuse.   Diet: Encouraged to  adjust caloric intake to maintain  or achieve ideal body weight, to reduce intake of dietary saturated fat and total fat, to limit sodium intake by avoiding high sodium foods and not adding table salt, and to maintain adequate dietary potassium and calcium preferably from fresh fruits, vegetables, and low-fat dairy products.    Stressed the importance of regular exercise  Injury prevention: Discussed safety belts, safety helmets, smoke detector, smoking near bedding or upholstery.   Dental health: Discussed importance of regular tooth brushing, flossing, and dental visits.   Follow up plan: NEXT PREVENTATIVE PHYSICAL DUE IN 1 YEAR. No follow-ups on file.

## 2021-10-08 NOTE — Assessment & Plan Note (Signed)
Chronic, ongoing. Continue current medication medication regimen and adjust as needed. Lipid panel today.

## 2021-10-09 LAB — CBC WITH DIFFERENTIAL/PLATELET
Basophils Absolute: 0 10*3/uL (ref 0.0–0.2)
Basos: 1 %
EOS (ABSOLUTE): 0.2 10*3/uL (ref 0.0–0.4)
Eos: 4 %
Hematocrit: 43.9 % (ref 37.5–51.0)
Hemoglobin: 15.4 g/dL (ref 13.0–17.7)
Immature Grans (Abs): 0 10*3/uL (ref 0.0–0.1)
Immature Granulocytes: 0 %
Lymphocytes Absolute: 1.1 10*3/uL (ref 0.7–3.1)
Lymphs: 24 %
MCH: 31.6 pg (ref 26.6–33.0)
MCHC: 35.1 g/dL (ref 31.5–35.7)
MCV: 90 fL (ref 79–97)
Monocytes Absolute: 0.4 10*3/uL (ref 0.1–0.9)
Monocytes: 8 %
Neutrophils Absolute: 2.9 10*3/uL (ref 1.4–7.0)
Neutrophils: 63 %
Platelets: 238 10*3/uL (ref 150–450)
RBC: 4.87 x10E6/uL (ref 4.14–5.80)
RDW: 13.3 % (ref 11.6–15.4)
WBC: 4.7 10*3/uL (ref 3.4–10.8)

## 2021-10-09 LAB — COMPREHENSIVE METABOLIC PANEL
ALT: 17 IU/L (ref 0–44)
AST: 16 IU/L (ref 0–40)
Albumin/Globulin Ratio: 2.2 (ref 1.2–2.2)
Albumin: 4.7 g/dL (ref 3.8–4.8)
Alkaline Phosphatase: 88 IU/L (ref 44–121)
BUN/Creatinine Ratio: 13 (ref 10–24)
BUN: 14 mg/dL (ref 8–27)
Bilirubin Total: 0.7 mg/dL (ref 0.0–1.2)
CO2: 24 mmol/L (ref 20–29)
Calcium: 9.7 mg/dL (ref 8.6–10.2)
Chloride: 100 mmol/L (ref 96–106)
Creatinine, Ser: 1.06 mg/dL (ref 0.76–1.27)
Globulin, Total: 2.1 g/dL (ref 1.5–4.5)
Glucose: 133 mg/dL — ABNORMAL HIGH (ref 70–99)
Potassium: 4.5 mmol/L (ref 3.5–5.2)
Sodium: 138 mmol/L (ref 134–144)
Total Protein: 6.8 g/dL (ref 6.0–8.5)
eGFR: 75 mL/min/{1.73_m2} (ref >59)

## 2021-10-09 LAB — MAGNESIUM: Magnesium: 1.9 mg/dL (ref 1.6–2.3)

## 2021-10-09 LAB — LIPID PANEL W/O CHOL/HDL RATIO
Cholesterol, Total: 89 mg/dL — ABNORMAL LOW (ref 100–199)
HDL: 35 mg/dL — ABNORMAL LOW (ref >39)
LDL Chol Calc (NIH): 36 mg/dL (ref 0–99)
Triglycerides: 93 mg/dL (ref 0–149)
VLDL Cholesterol Cal: 18 mg/dL (ref 5–40)

## 2021-10-09 LAB — PSA: Prostate Specific Ag, Serum: 0.5 ng/mL (ref 0.0–4.0)

## 2021-10-09 LAB — TSH: TSH: 1.84 u[IU]/mL (ref 0.450–4.500)

## 2021-10-09 NOTE — Progress Notes (Signed)
Good morning, please let Hemi know his labs have returned and everything remains nice and normal.  No changes needed.  Have a wonderful day!! Keep being awesome!!  Thank you for allowing me to participate in your care.  I appreciate you. Kindest regards, Selenne Coggin

## 2021-10-22 ENCOUNTER — Other Ambulatory Visit: Payer: Self-pay | Admitting: Nurse Practitioner

## 2021-10-22 ENCOUNTER — Other Ambulatory Visit: Payer: Self-pay

## 2021-10-22 MED ORDER — ACCU-CHEK AVIVA PLUS VI STRP: ORAL_STRIP | 3 refills | Status: DC

## 2021-10-22 NOTE — Telephone Encounter (Signed)
Per visit 2 weeks ago- patient to continue medications and return in 6 months- although Rx expired will RF per notes Requested Prescriptions  Pending Prescriptions Disp Refills  . rosuvastatin (CRESTOR) 20 MG tablet [Pharmacy Med Name: Rosuvastatin Calcium 20 MG Oral Tablet] 90 tablet 0    Sig: Take 1 tablet by mouth once daily     Cardiovascular:  Antilipid - Statins 2 Failed - 10/22/2021 11:03 AM      Failed - Lipid Panel in normal range within the last 12 months    Cholesterol, Total  Date Value Ref Range Status  10/08/2021 89 (L) 100 - 199 mg/dL Final   Cholesterol Piccolo, Waived  Date Value Ref Range Status  03/01/2018 99 <200 mg/dL Final    Comment:                            Desirable                <200                         Borderline High      200- 239                         High                     >239    LDL Chol Calc (NIH)  Date Value Ref Range Status  10/08/2021 36 0 - 99 mg/dL Final   HDL  Date Value Ref Range Status  10/08/2021 35 (L) >39 mg/dL Final   Triglycerides  Date Value Ref Range Status  10/08/2021 93 0 - 149 mg/dL Final   Triglycerides Piccolo,Waived  Date Value Ref Range Status  03/01/2018 84 <150 mg/dL Final    Comment:                            Normal                   <150                         Borderline High     150 - 199                         High                200 - 499                         Very High                >499          Passed - Cr in normal range and within 360 days    Creatinine, Ser  Date Value Ref Range Status  10/08/2021 1.06 0.76 - 1.27 mg/dL Final         Passed - Patient is not pregnant      Passed - Valid encounter within last 12 months    Recent Outpatient Visits          2 weeks ago Type 2 diabetes mellitus with diabetic neuropathy, without long-term current use of insulin ([place])   [place], Barbaraann Faster, NP  3 months ago Type 2 diabetes mellitus with diabetic neuropathy,  without long-term current use of insulin ([place])   [place], Jolene T, NP   6 months ago Type 2 diabetes mellitus with diabetic neuropathy, without long-term current use of insulin ([place])   [place], Jolene T, NP   9 months ago Type 2 diabetes mellitus with diabetic neuropathy, without long-term current use of insulin ([place])   [place], [place] T, NP   1 year ago Type 2 diabetes mellitus with diabetic neuropathy, without long-term current use of insulin ([place])   [place], [place] T, NP      Future Appointments            In 1 week [place], Hunt Oris, PA-C [place]   In 1 month  [place], [place]   In 5 months [place], [place] T, NP [facility]           . metFORMIN (GLUCOPHAGE) 500 MG tablet [employer] Med Name: metFORMIN HCl 500 MG Oral Tablet] 360 tablet 0    Sig: TAKE 2 TABLETS BY MOUTH TWICE DAILY WITH A MEAL     Endocrinology:  Diabetes - Biguanides Failed - 10/22/2021 11:03 AM      Failed - B12 Level in normal range and within 720 days    Vitamin B-12  Date Value Ref Range Status  06/09/2019 597 232 - 1,245 pg/mL Final         Passed - Cr in normal range and within 360 days    Creatinine, Ser  Date Value Ref Range Status  10/08/2021 1.06 0.76 - 1.27 mg/dL Final         Passed - HBA1C is between 0 and 7.9 and within 180 days    HB A1C (BAYER DCA - WAIVED)  Date Value Ref Range Status  10/08/2021 6.6 (H) 4.8 - 5.6 % Final    Comment:             Prediabetes: 5.7 - 6.4          Diabetes: >6.4          Glycemic control for adults with diabetes: <7.0          Passed - eGFR in normal range and within 360 days    GFR calc Af Amer  Date Value Ref Range Status  03/28/2020 72 >59 mL/min/1.73 Final    Comment:    **In accordance with recommendations from the NKF-ASN Task force,**   [facility] is in the process of updating its eGFR  calculation to the   2021 CKD-EPI creatinine equation that estimates kidney function   without a race variable.    GFR calc non Af Amer  Date Value Ref Range Status  03/28/2020 62 >59 mL/min/1.73 Final   eGFR  Date Value Ref Range Status  10/08/2021 75 >59 mL/min/1.73 Final         Passed - Valid encounter within last 6 months    Recent Outpatient Visits          2 weeks ago Type 2 diabetes mellitus with diabetic neuropathy, without long-term current use of insulin ([place])   [place], Jolene T, NP   3 months ago Type 2 diabetes mellitus with diabetic neuropathy, without long-term current use of insulin ([place])   [place], [place] T, NP   6 months ago Type 2 diabetes mellitus with diabetic neuropathy, without long-term  current use of insulin ([place])   [place], [place] T, NP   9 months ago Type 2 diabetes mellitus with diabetic neuropathy, without long-term current use of insulin ([place])   [place], [place] T, NP   1 year ago Type 2 diabetes mellitus with diabetic neuropathy, without long-term current use of insulin ([place])   [place], Barbaraann Faster, NP      Future Appointments            In 1 week [place], Hunt Oris, PA-C [place]   In 1 month  [facility], [place]   In 5 months [place], [place] T, NP [facility]           Passed - CBC within normal limits and completed in the last 12 months    WBC  Date Value Ref Range Status  10/08/2021 4.7 3.4 - 10.8 x10E3/uL Final   RBC  Date Value Ref Range Status  10/08/2021 4.87 4.14 - 5.80 x10E6/uL Final   Hemoglobin  Date Value Ref Range Status  10/08/2021 15.4 13.0 - 17.7 g/dL Final   Hematocrit  Date Value Ref Range Status  10/08/2021 43.9 37.5 - 51.0 % Final   MCHC  Date Value Ref Range Status  10/08/2021 35.1 31.5 - 35.7 g/dL Final   [facility]  Date Value Ref Range  Status  10/08/2021 31.6 26.6 - 33.0 pg Final   MCV  Date Value Ref Range Status  10/08/2021 90 79 - 97 fL Final   No results found for: "PLTCOUNTKUC", "LABPLAT", "POCPLA" RDW  Date Value Ref Range Status  10/08/2021 13.3 11.6 - 15.4 % Final

## 2021-10-22 NOTE — Telephone Encounter (Signed)
Received an fax requesting an new order be placed for patient's accu-check strips and lancets. I have reordered the accu-check strips, but do not see the lancets in patient's chart. Please advise?

## 2021-10-27 ENCOUNTER — Other Ambulatory Visit: Payer: Self-pay

## 2021-10-27 DIAGNOSIS — E349 Endocrine disorder, unspecified: Secondary | ICD-10-CM

## 2021-10-28 ENCOUNTER — Other Ambulatory Visit: Payer: Medicare Other

## 2021-10-28 DIAGNOSIS — E349 Endocrine disorder, unspecified: Secondary | ICD-10-CM | POA: Diagnosis not present

## 2021-10-29 LAB — HEMOGLOBIN AND HEMATOCRIT, BLOOD
Hematocrit: 46.4 % (ref 37.5–51.0)
Hemoglobin: 15.9 g/dL (ref 13.0–17.7)

## 2021-10-29 LAB — TESTOSTERONE: Testosterone: 229 ng/dL — ABNORMAL LOW (ref 264–916)

## 2021-10-29 NOTE — Progress Notes (Signed)
Error

## 2021-10-30 ENCOUNTER — Ambulatory Visit: Payer: Medicare Other | Admitting: Urology

## 2021-10-30 ENCOUNTER — Other Ambulatory Visit: Payer: Self-pay

## 2021-10-30 ENCOUNTER — Encounter: Payer: Self-pay | Admitting: Urology

## 2021-10-30 VITALS — BP 129/66 | HR 46 | Ht 66.0 in | Wt 172.0 lb

## 2021-10-30 DIAGNOSIS — N138 Other obstructive and reflux uropathy: Secondary | ICD-10-CM

## 2021-10-30 DIAGNOSIS — N401 Enlarged prostate with lower urinary tract symptoms: Secondary | ICD-10-CM

## 2021-10-30 DIAGNOSIS — E291 Testicular hypofunction: Secondary | ICD-10-CM | POA: Diagnosis not present

## 2021-10-30 DIAGNOSIS — N529 Male erectile dysfunction, unspecified: Secondary | ICD-10-CM | POA: Diagnosis not present

## 2021-10-30 DIAGNOSIS — E349 Endocrine disorder, unspecified: Secondary | ICD-10-CM

## 2021-10-30 MED ORDER — SYRINGE 2-3 ML 3 ML MISC: 1.0000 mg | 3 refills | Status: AC

## 2021-10-30 MED ORDER — TESTOSTERONE CYPIONATE 200 MG/ML IM SOLN: INTRAMUSCULAR | 0 refills | Status: DC

## 2021-10-30 MED ORDER — "BD DISP NEEDLES 21G X 1-1/2"" MISC": 1.0000 mg | 0 refills | Status: AC

## 2021-10-30 MED ORDER — "BD DISP NEEDLES 18G X 1-1/2"" MISC": 1.0000 mg | 0 refills | Status: DC

## 2021-10-30 NOTE — Progress Notes (Signed)
10/30/2021 9:12 AM   Lawrence Jensen [birth year] [id number]  Referring provider: Venita Lick, NP [address] [place],  [place] [address]  Urological history: 1.  Testosterone deficiency -Contributing factors of age and diabetes -Testosterone level, 10/2021 - 229 -Hemoglobin/hematocrit, 10/2021 - 15.9/46.4 -Testosterone cypionate 0.5 cc every 14 days   2. ED -Contributing factors of age, testosterone deficiency, diabetes, hypertension, hyperlipidemia and history of smoking -SHIM 21 -failed PDE5i's, ICI and VED  3. BPH with LU TS -PSA, 09/2021 - 0.5  -I PSS 4/0  4. Family history of prostate cancer -Father underwent a prostatectomy at age 67 for prostate cancer   Chief Complaint  Patient presents with   Erectile Dysfunction   Hypogonadism    HPI: Lawrence Jensen is a 71 y.o. male who presents today for three month follow up.  He reports today that he stopped taking meloxicam after starting testosterone. His neuropathy has improved and his diabetes is under control.He denies any spontaneous erections or curvature.   He has also seen a reduction in his nocturia after starting his testosterone.  Patient denies any modifying or aggravating factors. Patient denies any gross hematuria, dysuria or suprapubic/flank pain. Patient denies any fevers, chills, nausea or vomiting.     IPSS     Row Name 10/30/21 0800         International Prostate Symptom Score   How often have you had the sensation of not emptying your bladder? Not at All     How often have you had to urinate less than every two hours? Less than half the time     How often have you found you stopped and started again several times when you urinated? Not at All     How often have you found it difficult to postpone urination? Less than 1 in 5 times     How often have you had a weak urinary stream? Not at All     How often have you had to strain to start urination? Not at All     How many times did  you typically get up at night to urinate? 1 Time     Total IPSS Score 4       Quality of Life due to urinary symptoms   If you were to spend the rest of your life with your urinary condition just the way it is now how would you feel about that? Delighted              Score:  1-7 Mild 8-19 Moderate 20-35 Severe   He answers on the SHIM are erroneous as he states he is not having erections at all.    SHIM     Row Name 10/30/21 0835         SHIM: Over the last 6 months:   How do you rate your confidence that you could get and keep an erection? Very Low     When you had erections with sexual stimulation, how often were your erections hard enough for penetration (entering your partner)? Almost Always or Always     During sexual intercourse, how often were you able to maintain your erection after you had penetrated (entered) your partner? Almost Always or Always     During sexual intercourse, how difficult was it to maintain your erection to completion of intercourse? Not Difficult     When you attempted sexual intercourse, how often was it satisfactory for you? Almost Always or Always  SHIM Total Score   SHIM 21               PMH: Past Medical History:  Diagnosis Date   Diabetes mellitus without complication [facility])    ED (erectile dysfunction)    Hyperlipidemia    Hypertension     Surgical History: Past Surgical History:  Procedure Laterality Date   APPENDECTOMY  04/13/1997   TOOTH EXTRACTION      Home Medications:  Allergies as of 10/30/2021   No Known Allergies      Medication List        Accurate as of October 30, 2021  9:12 AM. If you have any questions, ask your nurse or doctor.          2-3CC SYRINGE 3 ML Misc 1 mg by Does not apply route once a week.   Accu-Chek Aviva Plus test strip Generic drug: glucose blood USE AS INSTRUCTED   amLODipine 5 MG tablet Commonly known as: NORVASC Take 1 tablet (5 mg total) by mouth daily.    aspirin EC 81 MG tablet Take 81 mg by mouth daily.   BD Disp Needles 18G X 1-1/2" Misc Generic drug: NEEDLE (DISP) 18 G 1 mg by Does not apply route once a week.   BD Disp Needles 21G X 1-1/2" Misc Generic drug: NEEDLE (DISP) 21 G 1 mg by Does not apply route once a week.   COD LIVER OIL PO Take 3 tablets by mouth daily.   lisinopril 20 MG tablet Commonly known as: ZESTRIL Take 1 tablet (20 mg total) by mouth daily.   Magnesium 400 MG Tabs Take 400 mg by mouth 2 (two) times daily.   meloxicam 15 MG tablet Commonly known as: MOBIC Take 1 tablet (15 mg total) by mouth daily.   metFORMIN 500 MG tablet Commonly known as: GLUCOPHAGE TAKE 2 TABLETS BY MOUTH TWICE DAILY WITH A MEAL   ONE TOUCH ULTRA 2 w/Device Kit Inject 1 kit into the skin 3 (three) times daily.   OT ULTRA/FASTTK CNTRL SOLN Soln   rosuvastatin 20 MG tablet Commonly known as: CRESTOR Take 1 tablet by mouth once daily   testosterone cypionate 200 MG/ML injection Commonly known as: DEPOTESTOSTERONE CYPIONATE Inject 0.5 cc (100 mg) every 7 days What changed:  how much to take how to take this when to take this additional instructions        Allergies: No Known Allergies  Family History: Family History  Problem Relation Age of Onset   Heart disease Mother    Heart disease Father    Congestive Heart Failure Father    Cancer Father        prostate   Diabetes Father    Stroke Father    Hypertension Father    Cancer Brother    Stroke Maternal Grandmother    Heart disease Brother    COPD Neg Hx     Social History:  reports that he quit smoking about 26 years ago. His smoking use included cigarettes. He has a 35.00 pack-year smoking history. He has never used smokeless tobacco. He reports that he does not drink alcohol and does not use drugs.  ROS: Pertinent ROS in HPI  Physical Exam: BP 129/66   Pulse (!) 46   Ht 5' 6"  (1.676 m)   Wt 172 lb (78 kg)   BMI 27.76 kg/m   Constitutional:   Well nourished. Alert and oriented, No acute distress. HEENT: [place] AT, moist mucus membranes.  Trachea midline  Cardiovascular: No clubbing, cyanosis, or edema. Respiratory: Normal respiratory effort, no increased work of breathing.   Neurologic: Grossly intact, no focal deficits, moving all 4 extremities. Psychiatric: Normal mood and affect.  Laboratory Data: Lab Results  Component Value Date   WBC 4.7 10/08/2021   HGB 15.9 10/28/2021   HCT 46.4 10/28/2021   MCV 90 10/08/2021   PLT 238 10/08/2021    Lab Results  Component Value Date   CREATININE 1.06 10/08/2021    Lab Results  Component Value Date   TESTOSTERONE 229 (L) 10/28/2021    Lab Results  Component Value Date   HGBA1C 6.6 (H) 10/08/2021    Lab Results  Component Value Date   TSH 1.840 10/08/2021       Component Value Date/Time   CHOL 89 (L) 10/08/2021 0919   CHOL 99 03/01/2018 0836   HDL 35 (L) 10/08/2021 0919   VLDL 17 03/01/2018 0836   LDLCALC 36 10/08/2021 0919    Lab Results  Component Value Date   AST 16 10/08/2021   Lab Results  Component Value Date   ALT 17 10/08/2021   Component     Latest Ref Rng 10/08/2021  Prostate Specific Ag, Serum     0.0 - 4.0 ng/mL 0.5   I have reviewed the labs.   Pertinent Imaging: No results found for any visits on 10/30/21.   Assessment & Plan:    1. Testosterone deficiency  -testosterone levels are subtherapeutic -H & H  WNL -increase Testosterone cypionate to 0.5 cc (100 mg) every 7 days   2. BPH with LUTS -PSA stable -continue conservative management, avoiding bladder irritants and timed voiding's   3. Erectile dysfunction:    - failed PDE5i's, VED and ICI - not interested in IPP at this time  Return in about 1 month (around 11/30/2021) for testosterone recheck .   I, Kailey Littlejohn,acting as a scribe for [employer], PA-C.,have documented all relevant documentation on the behalf of Dazja Houchin, PA-C,as directed by  [facility], PA-C while in the presence of [place], PA-C.  [facility] [address]  [place], [place] [address] [phone]   I have reviewed the above documentation for accuracy and completeness, and I agree with the above.    Zara Council, PA-C

## 2021-11-24 ENCOUNTER — Other Ambulatory Visit: Payer: Medicare Other

## 2021-11-24 DIAGNOSIS — E349 Endocrine disorder, unspecified: Secondary | ICD-10-CM

## 2021-11-25 LAB — TESTOSTERONE: Testosterone: 604 ng/dL (ref 264–916)

## 2021-11-26 ENCOUNTER — Telehealth: Payer: Self-pay | Admitting: *Deleted

## 2021-11-26 NOTE — Telephone Encounter (Signed)
Notified patient as instructed, patient pleased. Discussed follow-up appointments, patient agrees  

## 2021-11-26 NOTE — Telephone Encounter (Signed)
-----   Message from Harle Battiest, PA-C sent at 11/25/2021  1:07 PM EDT ----- Please let Lawrence Jensen know that his testosterone level is within the therapeutic range and to continue the current dose of 0.5 cc every 7 days.  We need recheck his levels (mid way of injection)  again in three months along with a hemoglobin and hematocrit.

## 2021-12-08 ENCOUNTER — Ambulatory Visit: Payer: Medicare Other

## 2021-12-08 ENCOUNTER — Other Ambulatory Visit: Payer: Self-pay | Admitting: Nurse Practitioner

## 2021-12-09 NOTE — Telephone Encounter (Signed)
Requested Prescriptions  Pending Prescriptions Disp Refills  . lisinopril (ZESTRIL) 20 MG tablet [Pharmacy Med Name: Lisinopril 20 MG Oral Tablet] 90 tablet 0    Sig: Take 1 tablet by mouth once daily     Cardiovascular:  ACE Inhibitors Passed - 12/08/2021  1:19 PM      Passed - Cr in normal range and within 180 days    Creatinine, Ser  Date Value Ref Range Status  10/08/2021 1.06 0.76 - 1.27 mg/dL Final         Passed - K in normal range and within 180 days    Potassium  Date Value Ref Range Status  10/08/2021 4.5 3.5 - 5.2 mmol/L Final         Passed - Patient is not pregnant      Passed - Last BP in normal range    BP Readings from Last 1 Encounters:  10/30/21 129/66         Passed - Valid encounter within last 6 months    Recent Outpatient Visits          2 months ago Type 2 diabetes mellitus with diabetic neuropathy, without long-term current use of insulin (HCC)   [facility] [place], Jolene T, NP   5 months ago Type 2 diabetes mellitus with diabetic neuropathy, without long-term current use of insulin (HCC)   [facility] [place], Jolene T, NP   8 months ago Type 2 diabetes mellitus with diabetic neuropathy, without long-term current use of insulin (HCC)   [facility] Cannady, Jolene T, NP   11 months ago Type 2 diabetes mellitus with diabetic neuropathy, without long-term current use of insulin (HCC)   [facility] [place], [place] T, NP   1 year ago Type 2 diabetes mellitus with diabetic neuropathy, without long-term current use of insulin (HCC)   [facility] [place], Dorie Rank, NP      Future Appointments            In 4 months Cannady, Dorie Rank, NP [employer], [facility]

## 2021-12-23 ENCOUNTER — Telehealth: Payer: Self-pay | Admitting: Nurse Practitioner

## 2021-12-23 NOTE — Telephone Encounter (Signed)
Copied from CRM [id number]. Topic: Medicare AWV >> Dec 23, 2021 10:42 AM Zannie Kehr wrote: Reason for CRM:  Left message for patient to call back and schedule Medicare Annual Wellness Visit (AWV). Please offer to do virtually or by telephone.  Last AWV: 12/06/2020  Please schedule at any time with [facility] Health Advisor.  30 minute appointment for Virtual or phone 45 minute appointment for Initial virtual/phone  Any questions, please contact me at [phone]

## 2022-01-07 ENCOUNTER — Other Ambulatory Visit: Payer: Self-pay | Admitting: Nurse Practitioner

## 2022-01-07 DIAGNOSIS — I152 Hypertension secondary to endocrine disorders: Secondary | ICD-10-CM

## 2022-01-07 NOTE — Telephone Encounter (Signed)
Requested Prescriptions  Pending Prescriptions Disp Refills  . amLODipine (NORVASC) 5 MG tablet [Pharmacy Med Name: amLODIPine Besylate 5 MG Oral Tablet] 90 tablet 1    Sig: Take 1 tablet by mouth once daily     Cardiovascular: Calcium Channel Blockers 2 Passed - 01/07/2022  5:43 PM      Passed - Last BP in normal range    BP Readings from Last 1 Encounters:  10/30/21 129/66         Passed - Last Heart Rate in normal range    Pulse Readings from Last 1 Encounters:  10/30/21 (!) 15         Passed - Valid encounter within last 6 months    Recent Outpatient Visits          3 months ago Type 2 diabetes mellitus with diabetic neuropathy, without long-term current use of insulin ([place])   [place], [place] T, NP   6 months ago Type 2 diabetes mellitus with diabetic neuropathy, without long-term current use of insulin ([place])   [place], Jolene T, NP   9 months ago Type 2 diabetes mellitus with diabetic neuropathy, without long-term current use of insulin ([place])   [place], [place] T, NP   1 year ago Type 2 diabetes mellitus with diabetic neuropathy, without long-term current use of insulin ([place])   [place], [place] T, NP   1 year ago Type 2 diabetes mellitus with diabetic neuropathy, without long-term current use of insulin ([place])   [place], Barbaraann Faster, NP      Future Appointments            In 3 months Cannady, Barbaraann Faster, NP [employer], [facility]

## 2022-02-11 ENCOUNTER — Telehealth: Payer: Self-pay

## 2022-02-11 NOTE — Telephone Encounter (Signed)
Reviewing reports for the office, patient is overdue for colorectal screening. Called and spoke to patient regarding this. He states that he has a Cologuard kit at home that he received recently and is planning to complete the kit within the next few days.

## 2022-02-12 DIAGNOSIS — Z1211 Encounter for screening for malignant neoplasm of colon: Secondary | ICD-10-CM | POA: Diagnosis not present

## 2022-02-17 ENCOUNTER — Ambulatory Visit (INDEPENDENT_AMBULATORY_CARE_PROVIDER_SITE_OTHER): Payer: Medicare Other | Admitting: *Deleted

## 2022-02-17 ENCOUNTER — Other Ambulatory Visit: Payer: Self-pay | Admitting: Family Medicine

## 2022-02-17 DIAGNOSIS — Z0000 Encounter for general adult medical examination without abnormal findings: Secondary | ICD-10-CM

## 2022-02-17 DIAGNOSIS — E349 Endocrine disorder, unspecified: Secondary | ICD-10-CM

## 2022-02-17 NOTE — Progress Notes (Signed)
Subjective:   Lawrence Jensen is a 71 y.o. male who presents for Medicare Annual/Subsequent preventive examination.  I connected with  Lawrence Jensen on 02/17/22 by a telephone enabled telemedicine application and verified that I am speaking with the correct person using two identifiers.   I discussed the limitations of evaluation and management by telemedicine. The patient expressed understanding and agreed to proceed.  Patient location: home  Provider location: Tele-health-home    Review of Systems     Cardiac Risk Factors include: advanced age (>77mn, >>32women);diabetes mellitus;male gender;hypertension;family history of premature cardiovascular disease     Objective:    Today's Vitals   There is no height or weight on file to calculate BMI.     12/06/2020    1:02 PM 12/04/2019   10:33 AM 11/28/2018    4:09 PM 02/04/2018    4:43 PM 11/17/2017    3:55 PM 02/02/2017    8:23 AM 11/02/2016    9:04 AM  Advanced Directives  Does Patient Have a Medical Advance Directive? No No Yes No No No No  Would patient like information on creating a medical advance directive?    No - Patient declined Yes (MAU/Ambulatory/Procedural Areas - Information given)  No - Patient declined    Current Medications (verified) Outpatient Encounter Medications as of 02/17/2022  Medication Sig   amLODipine (NORVASC) 5 MG tablet Take 1 tablet by mouth once daily   aspirin EC 81 MG tablet Take 81 mg by mouth daily.   Blood Glucose Calibration (OT ULTRA/FASTTK CNTRL SOLN) SOLN    Blood Glucose Monitoring Suppl (ONE TOUCH ULTRA 2) w/Device KIT Inject 1 kit into the skin 3 (three) times daily.   COD LIVER OIL PO Take 3 tablets by mouth daily.   glucose blood (ACCU-CHEK AVIVA PLUS) test strip USE AS INSTRUCTED   lisinopril (ZESTRIL) 20 MG tablet Take 1 tablet by mouth once daily   Magnesium 400 MG TABS Take 400 mg by mouth 2 (two) times daily.   meloxicam (MOBIC) 15 MG tablet Take 1 tablet (15 mg  total) by mouth daily.   metFORMIN (GLUCOPHAGE) 500 MG tablet TAKE 2 TABLETS BY MOUTH TWICE DAILY WITH A MEAL   NEEDLE, DISP, 18 G (BD DISP NEEDLES) 18G X 1-1/2" MISC 1 mg by Does not apply route once a week.   NEEDLE, DISP, 21 G (BD DISP NEEDLES) 21G X 1-1/2" MISC 1 mg by Does not apply route once a week.   rosuvastatin (CRESTOR) 20 MG tablet Take 1 tablet by mouth once daily   Syringe, Disposable, (2-3CC SYRINGE) 3 ML MISC 1 mg by Does not apply route once a week.   testosterone cypionate (DEPOTESTOSTERONE CYPIONATE) 200 MG/ML injection Inject 0.5 cc (100 mg) every 7 days   No facility-administered encounter medications on file as of 02/17/2022.    Allergies (verified) Patient has no known allergies.   History: Past Medical History:  Diagnosis Date   Diabetes mellitus without complication (HFrio    ED (erectile dysfunction)    Hyperlipidemia    Hypertension    Past Surgical History:  Procedure Laterality Date   APPENDECTOMY  04/13/1997   TOOTH EXTRACTION     Family History  Problem Relation Age of Onset   Heart disease Mother    Heart disease Father    Congestive Heart Failure Father    Cancer Father        prostate   Diabetes Father    Stroke Father  Hypertension Father    Cancer Brother    Stroke Maternal Grandmother    Heart disease Brother    COPD Neg Hx    Social History   Socioeconomic History   Marital status: Married    Spouse name: Not on file   Number of children: Not on file   Years of education: Not on file   Highest education level: Associate degree: academic program  Occupational History   Occupation: retired  Tobacco Use   Smoking status: Former    Packs/day: 1.00    Years: 35.00    Total pack years: 35.00    Types: Cigarettes    Quit date: 04/03/1995    Years since quitting: 26.8   Smokeless tobacco: Never  Vaping Use   Vaping Use: Never used  Substance and Sexual Activity   Alcohol use: No   Drug use: No   Sexual activity: Not  Currently  Other Topics Concern   Not on file  Social History Narrative   Not on file   Social Determinants of Health   Financial Resource Strain: Low Risk  (02/17/2022)   Overall Financial Resource Strain (CARDIA)    Difficulty of Paying Living Expenses: Not hard at all  Food Insecurity: No Food Insecurity (02/17/2022)   Hunger Vital Sign    Worried About Running Out of Food in the Last Year: Never true    [place] in the Last Year: Never true  Transportation Needs: No Transportation Needs (12/06/2020)   PRAPARE - Hydrologist (Medical): No    Lack of Transportation (Non-Medical): No  Physical Activity: Inactive (02/17/2022)   Exercise Vital Sign    Days of Exercise per Week: 0 days    Minutes of Exercise per Session: 0 min  Stress: No Stress Concern Present (02/17/2022)   [place]    Feeling of Stress : Not at all  Social Connections: Moderately Isolated (02/17/2022)   Social Connection and Isolation Panel [NHANES]    Frequency of Communication with Friends and Family: More than three times a week    Frequency of Social Gatherings with Friends and Family: Once a week    Attends Religious Services: Never    Marine scientist or Organizations: No    Attends Music therapist: Never    Marital Status: Married    Tobacco Counseling Counseling given: Not Answered   Clinical Intake:  Pre-visit preparation completed: Yes  Pain : No/denies pain     Diabetes: Yes CBG done?: No Did pt. bring in CBG monitor from home?: No  How often do you need to have someone help you when you read instructions, pamphlets, or other written materials from your doctor or pharmacy?: 1 - Never  Diabetic?  Yes  Nutrition Risk Assessment:  Has the patient had any N/V/D within the last 2 months?  No  Does the patient have any non-healing wounds?  No  Has the patient had any  unintentional weight loss or weight gain?  No   Diabetes:  Is the patient diabetic?  Yes  If diabetic, was a CBG obtained today?  No  Did the patient bring in their glucometer from home?  No  How often do you monitor your CBG's? 1 x a day.   Financial Strains and Diabetes Management:  Are you having any financial strains with the device, your supplies or your medication? No .  Does the patient  want to be seen by Chronic Care Management for management of their diabetes?  No  Would the patient like to be referred to a Nutritionist or for Diabetic Management?  No   Diabetic Exams:  Diabetic Eye Exam: . Pt has been advised about the importance in completing this exam.  Diabetic Foot Exam: . Pt has been advised about the importance in completing this exam.     Interpreter Needed?: No  Information entered by :: Leroy Kennedy LPN   Activities of Daily Living    02/17/2022   10:48 AM 10/08/2021    8:26 AM  In your present state of health, do you have any difficulty performing the following activities:  Hearing? 0 0  Vision? 0 0  Difficulty concentrating or making decisions? 0 0  Walking or climbing stairs? 0 0  Dressing or bathing? 0 0  Doing errands, shopping? 0 0  Preparing Food and eating ? N   Using the Toilet? N   In the past six months, have you accidently leaked urine? N   Do you have problems with loss of bowel control? N   Managing your Medications? N   Managing your Finances? N   Housekeeping or managing your Housekeeping? N     Patient Care Team: Venita Lick, NP as PCP - General (Nurse Practitioner)  Indicate any recent Medical Services you may have received from other than [facility] providers in the past year (date may be approximate).     Assessment:   This is a routine wellness examination for Harim.  Hearing/Vision screen Hearing Screening - Comments:: No trouble hearng Vision Screening - Comments:: [facility] Up to date  Dietary issues and exercise  activities discussed: Current Exercise Habits: The patient does not participate in regular exercise at present   Goals Addressed             This Visit's Progress    DIET - [place]   On track    Recommend drinking at least 6-8 glasses of water a day      Patient Stated   On track    12/04/2019, wants to stay active     Patient Stated   On track    12/06/2020, stay healthy     Patient Stated       Would like to see sugar come down some       Depression Screen    02/17/2022   10:42 AM 10/08/2021    8:29 AM 07/03/2021    8:08 AM 04/03/2021    8:29 AM 12/06/2020    1:02 PM 10/03/2020    8:55 AM 12/04/2019   10:35 AM  PHQ 2/9 Scores  PHQ - 2 Score 0 1 0 0 0 0 0  PHQ- 9 Score 0 _0 Fall Risk    02/17/2022   10:41 AM 12/06/2020    1:02 PM 12/04/2019   10:34 AM 09/06/2019    7:54 AM 11/28/2018    3:59 PM  Fall Risk   Falls in the past year? 0 0 0 0 0  Number falls in past yr: 0   0   Injury with Fall? 0   0   Risk for fall due to :  Medication side effect Medication side effect    Follow up Falls evaluation completed;Education provided;Falls prevention discussed Falls evaluation completed;Education provided;Falls prevention discussed Falls evaluation completed;Education provided;Falls prevention discussed Falls evaluation completed     FALL  RISK PREVENTION PERTAINING TO THE HOME:  Any stairs in or around the home? Yes  If so, are there any without handrails? No  Home free of loose throw rugs in walkways, pet beds, electrical cords, etc? Yes  Adequate lighting in your home to reduce risk of falls? Yes   ASSISTIVE DEVICES UTILIZED TO PREVENT FALLS:  Life alert? No  Use of a cane, walker or w/c? No  Grab bars in the bathroom? No  Shower chair or bench in shower? No  Elevated toilet seat or a handicapped toilet? No   TIMED UP AND GO:  Was the test performed? No .    Cognitive Function:        02/17/2022   10:38 AM 12/06/2020    1:04 PM  10/03/2020    9:24 AM 12/04/2019   10:36 AM 09/06/2019    8:19 AM  6CIT Screen  What Year? 0 points 0 points 0 points 0 points 0 points  What month? 0 points 0 points 0 points 0 points 0 points  What time? 0 points 0 points 0 points 0 points 0 points  Count back from 20 0 points 0 points 0 points 0 points 0 points  Months in reverse 0 points 0 points 0 points 0 points 0 points  Repeat phrase 2 points 4 points 0 points 0 points 0 points  Total Score 2 points 4 points 0 points 0 points 0 points    Immunizations Immunization History  Administered Date(s) Administered   Tdap 01/02/2014    TDAP status: Up to date  Flu Vaccine status: Declined, Education has been provided regarding the importance of this vaccine but patient still declined. Advised may receive this vaccine at local pharmacy or Health Dept. Aware to provide a copy of the vaccination record if obtained from local pharmacy or Health Dept. Verbalized acceptance and understanding.  Pneumococcal vaccine status: Declined,  Education has been provided regarding the importance of this vaccine but patient still declined. Advised may receive this vaccine at local pharmacy or Health Dept. Aware to provide a copy of the vaccination record if obtained from local pharmacy or Health Dept. Verbalized acceptance and understanding.   Covid-19 vaccine status: Declined, Education has been provided regarding the importance of this vaccine but patient still declined. Advised may receive this vaccine at local pharmacy or Health Dept.or vaccine clinic. Aware to provide a copy of the vaccination record if obtained from local pharmacy or Health Dept. Verbalized acceptance and understanding.  Qualifies for Shingles Vaccine? Yes   Zostavax completed No   Shingrix Completed?: No.    Education has been provided regarding the importance of this vaccine. Patient has been advised to call insurance company to determine out of pocket expense if they have not yet  received this vaccine. Advised may also receive vaccine at local pharmacy or Health Dept. Verbalized acceptance and understanding.  Screening Tests Health Maintenance  Topic Date Due   Fecal DNA (Cologuard)  12/05/2021   Pneumonia Vaccine 54+ Years old (1 - PCV) 04/03/2022 (Originally 07/21/2016)   Zoster Vaccines- Shingrix (1 of 2) 05/20/2022 (Originally 07/21/2001)   INFLUENZA VACCINE  07/12/2022 (Originally 11/11/2021)   HEMOGLOBIN A1C  04/09/2022   Diabetic kidney evaluation - Urine ACR  07/04/2022   OPHTHALMOLOGY EXAM  09/10/2022   Diabetic kidney evaluation - GFR measurement  10/09/2022   FOOT EXAM  10/09/2022   Medicare Annual Wellness (AWV)  02/18/2023   TETANUS/TDAP  01/03/2024   Hepatitis C Screening  Completed  HPV VACCINES  Aged Out   COVID-19 Vaccine  Discontinued    Health Maintenance  Health Maintenance Due  Topic Date Due   Fecal DNA (Cologuard)  12/05/2021    Colonoscopy patient stated he just sent in his Cologuard  Lung Cancer Screening: (Low Dose CT Chest recommended if Age 56-80 years, 30 pack-year currently smoking OR have quit w/in 15years.) does not qualify.   Lung Cancer Screening Referral:   Additional Screening:  Hepatitis C Screening: does not qualify; Completed 2020  Vision Screening: Recommended annual ophthalmology exams for early detection of glaucoma and other disorders of the eye. Is the patient up to date with their annual eye exam?  Yes  Who is the provider or what is the name of the office in which the patient attends annual eye exams? [facility] If pt is not established with a provider, would they like to be referred to a provider to establish care? No .   Dental Screening: Recommended annual dental exams for proper oral hygiene  Community Resource Referral / Chronic Care Management: CRR required this visit?  No   CCM required this visit?  No      Plan:     I have personally reviewed and noted the following in the patient's  chart:   Medical and social history Use of alcohol, tobacco or illicit drugs  Current medications and supplements including opioid prescriptions. Patient is not currently taking opioid prescriptions. Functional ability and status Nutritional status Physical activity Advanced directives List of other physicians Hospitalizations, surgeries, and ER visits in previous 12 months Vitals Screenings to include cognitive, depression, and falls Referrals and appointments  In addition, I have reviewed and discussed with patient certain preventive protocols, quality metrics, and best practice recommendations. A written personalized care plan for preventive services as well as general preventive health recommendations were provided to patient.     Leroy Kennedy, LPN   [id number]   Nurse Notes:

## 2022-02-17 NOTE — Patient Instructions (Signed)
Lawrence Jensen , Thank you for taking time to come for your Medicare Wellness Visit. I appreciate your ongoing commitment to your health goals. Please review the following plan we discussed and let me know if I can assist you in the future.   These are the goals we discussed:  Goals      DIET - INCREASE WATER INTAKE     Recommend drinking at least 6-8 glasses of water a day      Patient Stated     12/04/2019, wants to stay active     Patient Stated     12/06/2020, stay healthy     Patient Stated     Would like to see sugar come down some        This is a list of the screening recommended for you and due dates:  Health Maintenance  Topic Date Due   Cologuard (Stool DNA test)  12/05/2021   Pneumonia Vaccine (1 - PCV) 04/03/2022*   Zoster (Shingles) Vaccine (1 of 2) 05/20/2022*   Flu Shot  07/12/2022*   Hemoglobin A1C  04/09/2022   Yearly kidney health urinalysis for diabetes  07/04/2022   Eye exam for diabetics  09/10/2022   Yearly kidney function blood test for diabetes  10/09/2022   Complete foot exam   10/09/2022   Medicare Annual Wellness Visit  02/18/2023   Tetanus Vaccine  01/03/2024   Hepatitis C Screening: USPSTF Recommendation to screen - Ages 18-79 yo.  Completed   HPV Vaccine  Aged Out   COVID-19 Vaccine  Discontinued  *Topic was postponed. The date shown is not the original due date.    Advanced directives: Education provided  Conditions/risks identified:   Next appointment: Follow up in one year for your annual wellness visit. 04-09-2022 @ 8:40   [place]  Preventive Care 65 Years and Older, Male  Preventive care refers to lifestyle choices and visits with your health care provider that can promote health and wellness. What does preventive care include? A yearly physical exam. This is also called an annual well check. Dental exams once or twice a year. Routine eye exams. Ask your health care provider how often you should have your eyes checked. Personal  lifestyle choices, including: Daily care of your teeth and gums. Regular physical activity. Eating a healthy diet. Avoiding tobacco and drug use. Limiting alcohol use. Practicing safe sex. Taking low doses of aspirin every day. Taking vitamin and mineral supplements as recommended by your health care provider. What happens during an annual well check? The services and screenings done by your health care provider during your annual well check will depend on your age, overall health, lifestyle risk factors, and family history of disease. Counseling  Your health care provider may ask you questions about your: Alcohol use. Tobacco use. Drug use. Emotional well-being. Home and relationship well-being. Sexual activity. Eating habits. History of falls. Memory and ability to understand (cognition). Work and work Astronomer. Screening  You may have the following tests or measurements: Height, weight, and BMI. Blood pressure. Lipid and cholesterol levels. These may be checked every 5 years, or more frequently if you are over 63 years old. Skin check. Lung cancer screening. You may have this screening every year starting at age 70 if you have a 30-pack-year history of smoking and currently smoke or have quit within the past 15 years. Fecal occult blood test (FOBT) of the stool. You may have this test every year starting at age 3. Flexible sigmoidoscopy or colonoscopy.  You may have a sigmoidoscopy every 5 years or a colonoscopy every 10 years starting at age 42. Prostate cancer screening. Recommendations will vary depending on your family history and other risks. Hepatitis C blood test. Hepatitis B blood test. Sexually transmitted disease (STD) testing. Diabetes screening. This is done by checking your blood sugar (glucose) after you have not eaten for a while (fasting). You may have this done every 1-3 years. Abdominal aortic aneurysm (AAA) screening. You may need this if you are a  current or former smoker. Osteoporosis. You may be screened starting at age 70 if you are at high risk. Talk with your health care provider about your test results, treatment options, and if necessary, the need for more tests. Vaccines  Your health care provider may recommend certain vaccines, such as: Influenza vaccine. This is recommended every year. Tetanus, diphtheria, and acellular pertussis (Tdap, Td) vaccine. You may need a Td booster every 10 years. Zoster vaccine. You may need this after age 27. Pneumococcal 13-valent conjugate (PCV13) vaccine. One dose is recommended after age 53. Pneumococcal polysaccharide (PPSV23) vaccine. One dose is recommended after age 65. Talk to your health care provider about which screenings and vaccines you need and how often you need them. This information is not intended to replace advice given to you by your health care provider. Make sure you discuss any questions you have with your health care provider. Document Released: 04/26/2015 Document Revised: 12/18/2015 Document Reviewed: 01/29/2015 Elsevier Interactive Patient Education  2017 [employer].  Fall Prevention in the Home Falls can cause injuries. They can happen to people of all ages. There are many things you can do to make your home safe and to help prevent falls. What can I do on the outside of my home? Regularly fix the edges of walkways and driveways and fix any cracks. Remove anything that might make you trip as you walk through a door, such as a raised step or threshold. Trim any bushes or trees on the path to your home. Use bright outdoor lighting. Clear any walking paths of anything that might make someone trip, such as rocks or tools. Regularly check to see if handrails are loose or broken. Make sure that both sides of any steps have handrails. Any raised decks and porches should have guardrails on the edges. Have any leaves, snow, or ice cleared regularly. Use sand or salt on  walking paths during winter. Clean up any spills in your garage right away. This includes oil or grease spills. What can I do in the bathroom? Use night lights. Install grab bars by the toilet and in the tub and shower. Do not use towel bars as grab bars. Use non-skid mats or decals in the tub or shower. If you need to sit down in the shower, use a plastic, non-slip stool. Keep the floor dry. Clean up any water that spills on the floor as soon as it happens. Remove soap buildup in the tub or shower regularly. Attach bath mats securely with double-sided non-slip rug tape. Do not have throw rugs and other things on the floor that can make you trip. What can I do in the bedroom? Use night lights. Make sure that you have a light by your bed that is easy to reach. Do not use any sheets or blankets that are too big for your bed. They should not hang down onto the floor. Have a firm chair that has side arms. You can use this for support while you  get dressed. Do not have throw rugs and other things on the floor that can make you trip. What can I do in the kitchen? Clean up any spills right away. Avoid walking on wet floors. Keep items that you use a lot in easy-to-reach places. If you need to reach something above you, use a strong step stool that has a grab bar. Keep electrical cords out of the way. Do not use floor polish or wax that makes floors slippery. If you must use wax, use non-skid floor wax. Do not have throw rugs and other things on the floor that can make you trip. What can I do with my stairs? Do not leave any items on the stairs. Make sure that there are handrails on both sides of the stairs and use them. Fix handrails that are broken or loose. Make sure that handrails are as long as the stairways. Check any carpeting to make sure that it is firmly attached to the stairs. Fix any carpet that is loose or worn. Avoid having throw rugs at the top or bottom of the stairs. If you do  have throw rugs, attach them to the floor with carpet tape. Make sure that you have a light switch at the top of the stairs and the bottom of the stairs. If you do not have them, ask someone to add them for you. What else can I do to help prevent falls? Wear shoes that: Do not have high heels. Have rubber bottoms. Are comfortable and fit you well. Are closed at the toe. Do not wear sandals. If you use a stepladder: Make sure that it is fully opened. Do not climb a closed stepladder. Make sure that both sides of the stepladder are locked into place. Ask someone to hold it for you, if possible. Clearly mark and make sure that you can see: Any grab bars or handrails. First and last steps. Where the edge of each step is. Use tools that help you move around (mobility aids) if they are needed. These include: Canes. Walkers. Scooters. Crutches. Turn on the lights when you go into a dark area. Replace any light bulbs as soon as they burn out. Set up your furniture so you have a clear path. Avoid moving your furniture around. If any of your floors are uneven, fix them. If there are any pets around you, be aware of where they are. Review your medicines with your doctor. Some medicines can make you feel dizzy. This can increase your chance of falling. Ask your doctor what other things that you can do to help prevent falls. This information is not intended to replace advice given to you by your health care provider. Make sure you discuss any questions you have with your health care provider. Document Released: 01/24/2009 Document Revised: 09/05/2015 Document Reviewed: 05/04/2014 Elsevier Interactive Patient Education  2017 [employer].

## 2022-02-19 LAB — COLOGUARD
COLOGUARD: NEGATIVE
Cologuard: NEGATIVE

## 2022-02-19 LAB — EXTERNAL GENERIC LAB PROCEDURE: COLOGUARD: NEGATIVE

## 2022-02-25 ENCOUNTER — Other Ambulatory Visit: Payer: Medicare Other

## 2022-02-25 DIAGNOSIS — E349 Endocrine disorder, unspecified: Secondary | ICD-10-CM

## 2022-02-26 ENCOUNTER — Other Ambulatory Visit: Payer: Self-pay

## 2022-02-26 ENCOUNTER — Other Ambulatory Visit: Payer: Medicare Other

## 2022-02-26 DIAGNOSIS — E349 Endocrine disorder, unspecified: Secondary | ICD-10-CM

## 2022-02-26 LAB — HEMOGLOBIN: Hemoglobin: 17.1 g/dL (ref 13.0–17.7)

## 2022-02-26 LAB — HEMATOCRIT: Hematocrit: 49.3 % (ref 37.5–51.0)

## 2022-02-27 ENCOUNTER — Other Ambulatory Visit: Payer: Self-pay | Admitting: Nurse Practitioner

## 2022-02-27 ENCOUNTER — Other Ambulatory Visit: Payer: Medicare Other

## 2022-02-27 NOTE — Telephone Encounter (Signed)
Requested Prescriptions  Pending Prescriptions Disp Refills   lisinopril (ZESTRIL) 20 MG tablet [Pharmacy Med Name: Lisinopril 20 MG Oral Tablet] 90 tablet 0    Sig: Take 1 tablet by mouth once daily     Cardiovascular:  ACE Inhibitors Passed - 02/27/2022 11:50 AM      Passed - Cr in normal range and within 180 days    Creatinine, Ser  Date Value Ref Range Status  10/08/2021 1.06 0.76 - 1.27 mg/dL Final         Passed - K in normal range and within 180 days    Potassium  Date Value Ref Range Status  10/08/2021 4.5 3.5 - 5.2 mmol/L Final         Passed - Patient is not pregnant      Passed - Last BP in normal range    BP Readings from Last 1 Encounters:  10/30/21 129/66         Passed - Valid encounter within last 6 months    Recent Outpatient Visits           4 months ago Type 2 diabetes mellitus with diabetic neuropathy, without long-term current use of insulin (HCC)   [facility] [place], Jolene T, NP   7 months ago Type 2 diabetes mellitus with diabetic neuropathy, without long-term current use of insulin (HCC)   [facility] [place], Jolene T, NP   11 months ago Type 2 diabetes mellitus with diabetic neuropathy, without long-term current use of insulin (HCC)   [facility] [place], [place] T, NP   1 year ago Type 2 diabetes mellitus with diabetic neuropathy, without long-term current use of insulin (HCC)   [facility] [place], [place] T, NP   1 year ago Type 2 diabetes mellitus with diabetic neuropathy, without long-term current use of insulin (HCC)   [facility] [place], Dorie Rank, NP       Future Appointments             In 1 month [place], Dorie Rank, NP [employer], [facility]

## 2022-03-02 ENCOUNTER — Other Ambulatory Visit: Payer: Medicare Other

## 2022-03-02 DIAGNOSIS — E349 Endocrine disorder, unspecified: Secondary | ICD-10-CM | POA: Diagnosis not present

## 2022-03-03 ENCOUNTER — Telehealth: Payer: Self-pay

## 2022-03-03 LAB — TESTOSTERONE: Testosterone: 1023 ng/dL — ABNORMAL HIGH (ref 264–916)

## 2022-03-03 NOTE — Telephone Encounter (Signed)
Pt states he injected on Wednesday evening (02/25/22).

## 2022-03-03 NOTE — Telephone Encounter (Signed)
OK per DPR, [facility] notifying pt of message. Asked pt to return call to confirm receipt of my message and to book appts.

## 2022-03-03 NOTE — Telephone Encounter (Signed)
-----   Message from Harle Battiest, PA-C sent at 03/03/2022  8:18 AM EST ----- Please let Mr. Velaquez know that his testosterone level is elevated.  When was the blood work taken in regards to his last injection?

## 2022-03-12 NOTE — Telephone Encounter (Signed)
Pt scheduled for lab visit (1 wk after 4th injection) and OV.

## 2022-03-24 ENCOUNTER — Encounter: Payer: Self-pay | Admitting: Nurse Practitioner

## 2022-04-05 ENCOUNTER — Other Ambulatory Visit: Payer: Self-pay | Admitting: Nurse Practitioner

## 2022-04-08 NOTE — Telephone Encounter (Signed)
Requested Prescriptions  Pending Prescriptions Disp Refills   metFORMIN (GLUCOPHAGE) 500 MG tablet [Pharmacy Med Name: metFORMIN HCl 500 MG Oral Tablet] 360 tablet 0    Sig: TAKE 2 TABLETS BY MOUTH TWICE DAILY WITH A MEAL     Endocrinology:  Diabetes - Biguanides Failed - 04/05/2022 12:26 PM      Failed - B12 Level in normal range and within 720 days    Vitamin B-12  Date Value Ref Range Status  06/09/2019 597 232 - 1,245 pg/mL Final         Passed - Cr in normal range and within 360 days    Creatinine, Ser  Date Value Ref Range Status  10/08/2021 1.06 0.76 - 1.27 mg/dL Final         Passed - HBA1C is between 0 and 7.9 and within 180 days    HB A1C (BAYER DCA - WAIVED)  Date Value Ref Range Status  10/08/2021 6.6 (H) 4.8 - 5.6 % Final    Comment:             Prediabetes: 5.7 - 6.4          Diabetes: >6.4          Glycemic control for adults with diabetes: <7.0          Passed - eGFR in normal range and within 360 days    GFR calc Af Amer  Date Value Ref Range Status  03/28/2020 72 >59 mL/min/1.73 Final    Comment:    **In accordance with recommendations from the NKF-ASN Task force,**   [facility] is in the process of updating its eGFR calculation to the   2021 CKD-EPI creatinine equation that estimates kidney function   without a race variable.    GFR calc non Af Amer  Date Value Ref Range Status  03/28/2020 62 >59 mL/min/1.73 Final   eGFR  Date Value Ref Range Status  10/08/2021 75 >59 mL/min/1.73 Final         Passed - Valid encounter within last 6 months    Recent Outpatient Visits           6 months ago Type 2 diabetes mellitus with diabetic neuropathy, without long-term current use of insulin ([place])   [place], Jolene T, NP   9 months ago Type 2 diabetes mellitus with diabetic neuropathy, without long-term current use of insulin ([place])   [place], Orin T, NP   1 year ago Type 2 diabetes mellitus with diabetic  neuropathy, without long-term current use of insulin ([place])   [place], [place] T, NP   1 year ago Type 2 diabetes mellitus with diabetic neuropathy, without long-term current use of insulin ([place])   [place], [place] T, NP   1 year ago Type 2 diabetes mellitus with diabetic neuropathy, without long-term current use of insulin ([place])   [place], Barbaraann Faster, NP       Future Appointments             Tomorrow Venita Lick, NP [place], [facility]   In 3 weeks [place], Hunt Oris, PA-C [facility]            Passed - CBC within normal limits and completed in the last 12 months    WBC  Date Value Ref Range Status  10/08/2021 4.7 3.4 - 10.8 x10E3/uL Final   RBC  Date Value Ref Range Status  10/08/2021 4.87 4.14 - 5.80 x10E6/uL Final   Hemoglobin  Date Value Ref Range Status  02/25/2022 17.1 13.0 - 17.7 g/dL Final   Hematocrit  Date Value Ref Range Status  02/25/2022 49.3 37.5 - 51.0 % Final   MCHC  Date Value Ref Range Status  10/08/2021 35.1 31.5 - 35.7 g/dL Final   [facility]  Date Value Ref Range Status  10/08/2021 31.6 26.6 - 33.0 pg Final   MCV  Date Value Ref Range Status  10/08/2021 90 79 - 97 fL Final   No results found for: "PLTCOUNTKUC", "LABPLAT", "POCPLA" RDW  Date Value Ref Range Status  10/08/2021 13.3 11.6 - 15.4 % Final

## 2022-04-09 ENCOUNTER — Encounter: Payer: Self-pay | Admitting: Nurse Practitioner

## 2022-04-09 ENCOUNTER — Ambulatory Visit (INDEPENDENT_AMBULATORY_CARE_PROVIDER_SITE_OTHER): Payer: Medicare Other | Admitting: Nurse Practitioner

## 2022-04-09 VITALS — BP 121/67 | HR 57 | Temp 98.1°F | Ht 65.98 in | Wt 166.4 lb

## 2022-04-09 DIAGNOSIS — I152 Hypertension secondary to endocrine disorders: Secondary | ICD-10-CM | POA: Diagnosis not present

## 2022-04-09 DIAGNOSIS — R051 Acute cough: Secondary | ICD-10-CM

## 2022-04-09 DIAGNOSIS — E1159 Type 2 diabetes mellitus with other circulatory complications: Secondary | ICD-10-CM | POA: Diagnosis not present

## 2022-04-09 DIAGNOSIS — R7989 Other specified abnormal findings of blood chemistry: Secondary | ICD-10-CM

## 2022-04-09 DIAGNOSIS — E1140 Type 2 diabetes mellitus with diabetic neuropathy, unspecified: Secondary | ICD-10-CM | POA: Diagnosis not present

## 2022-04-09 DIAGNOSIS — Z6828 Body mass index (BMI) 28.0-28.9, adult: Secondary | ICD-10-CM

## 2022-04-09 DIAGNOSIS — E785 Hyperlipidemia, unspecified: Secondary | ICD-10-CM | POA: Diagnosis not present

## 2022-04-09 DIAGNOSIS — E1169 Type 2 diabetes mellitus with other specified complication: Secondary | ICD-10-CM

## 2022-04-09 LAB — BAYER DCA HB A1C WAIVED: HB A1C (BAYER DCA - WAIVED): 7.7 % — ABNORMAL HIGH (ref 4.8–5.6)

## 2022-04-09 MED ORDER — AMLODIPINE BESYLATE 5 MG PO TABS: 5.0000 mg | ORAL_TABLET | Freq: Every day | ORAL | 4 refills | Status: DC

## 2022-04-09 MED ORDER — ROSUVASTATIN CALCIUM 20 MG PO TABS
20.0000 mg | ORAL_TABLET | Freq: Every day | ORAL | 4 refills | Status: DC
Start: 2022-04-09 — End: 2023-04-30

## 2022-04-09 MED ORDER — ALBUTEROL SULFATE HFA 108 (90 BASE) MCG/ACT IN AERS: 2.0000 | INHALATION_SPRAY | Freq: Four times a day (QID) | RESPIRATORY_TRACT | 0 refills | Status: DC | PRN

## 2022-04-09 MED ORDER — METFORMIN HCL 1000 MG PO TABS: 1000.0000 mg | ORAL_TABLET | Freq: Two times a day (BID) | ORAL | 4 refills | Status: DC

## 2022-04-09 MED ORDER — AMOXICILLIN-POT CLAVULANATE 875-125 MG PO TABS: 1.0000 | ORAL_TABLET | Freq: Two times a day (BID) | ORAL | 0 refills | Status: AC

## 2022-04-09 MED ORDER — LISINOPRIL 20 MG PO TABS: 20.0000 mg | ORAL_TABLET | Freq: Every day | ORAL | 4 refills | Status: DC

## 2022-04-09 NOTE — Progress Notes (Signed)
BP 121/67   Pulse (!) 57   Temp 98.1 F (36.7 C) (Oral)   Ht 5' 5.98" (1.676 m)   Wt 166 lb 6.4 oz (75.5 kg)   SpO2 90%   BMI 26.87 kg/m    Subjective:    Patient ID: Lawrence Jensen, male    DOB: Feb 08, 1951, 71 y.o.   MRN: [id number]  HPI: Lawrence PickDavid Clark Hyslop is a 71 y.o. male  Chief Complaint  Patient presents with   Diabetes   Hypertension   Hyperlipidemia   sinus pain/pressure    Cough, started about Dec 22.  Patient states he is feeling better but can not get rid of the sinus pressure and the cough, has been taking OTC Dayquil   Currently being followed by urology, Dr. Lonna CobbStoioff, for low testosterone and is getting replacement -- last visit 10/30/21.  He reports levels are not regulated quite yet.  Reports less aches and pains + starting to feel somewhat better.  Has started walking some again.  Has lost 6 pounds since last visit.  DIABETES Takes Metformin 1000 MG two times a day.  Last A1c 6.6%.  Does endorse cheating on diet over the holidays. Hypoglycemic episodes:no Polydipsia/polyuria: no Visual disturbance: no Chest pain: no Paresthesias: no Glucose Monitoring: yes             Accucheck frequency: Daily in morning             Fasting glucose: 130 range on average - levels a little higher while sick             Post prandial:             Evening:             Before meals: Taking Insulin?: no             Long acting insulin:             Short acting insulin: Blood Pressure Monitoring: daily Retinal Examination: Up to Date -- [facility]  Foot Exam: Up to Date Pneumovax: refused Influenza: refused Aspirin: yes    HYPERTENSION / HYPERLIPIDEMIA Continues on Lisinopril 20 MG and Amlodipine 5 MG + Crestor 20 MG daily.  Satisfied with current treatment? yes Duration of hypertension: chronic BP monitoring frequency: every morning BP range: 120/70's at home on average BP medication side effects: no Duration of hyperlipidemia: chronic Cholesterol  medication side effects: no Cholesterol supplements: fish oil Medication compliance: good compliance Aspirin: yes Recent stressors: no Recurrent headaches: no Visual changes: no Palpitations: no Dyspnea: no Chest pain: no Lower extremity edema: no Dizzy/lightheaded: no   UPPER RESPIRATORY TRACT INFECTION Started with illness 04/03/22.  Started with sinus pressure/headache.  Having ongoing cough at this time.  Does not take vaccinations. Fever: no Cough: yes Shortness of breath:  occasional with coughing Wheezing:  occasional Chest pain: no Chest tightness: no Chest congestion: yes Nasal congestion: yes Runny nose: yes Post nasal drip: yes Sneezing: no Sore throat: no Swollen glands: no Sinus pressure: no Headache: no Face pain: no Toothache: no Ear pain: none Ear pressure: none Eyes red/itching:no Eye drainage/crusting: no  Vomiting: no Rash: no Fatigue: yes Sick contacts: yes Strep contacts: no  Context: fluctuating Recurrent sinusitis: no Relief with OTC cold/cough medications: yes  Treatments attempted: Dayquil       04/09/2022    9:17 AM 02/17/2022   10:42 AM 10/08/2021    8:29 AM 07/03/2021    8:08 AM 04/03/2021  8:29 AM  Depression screen PHQ 2/9  Decreased Interest 0 0 1 0 0  Down, Depressed, Hopeless 0 0 0 0 0  PHQ - 2 Score 0 0 1 0 0  Altered sleeping 0 0 0 0 0  Tired, decreased energy 0 0 3 3 3   Change in appetite 0 0 0 0 0  Feeling bad or failure about yourself  0 0 0 0 0  Trouble concentrating 0 0 0 0 0  Moving slowly or fidgety/restless 0 0 0 0 2  Suicidal thoughts 0 0 0 0 0  PHQ-9 Score 0 0 4 3 5   Difficult doing work/chores Not difficult at all Not difficult at all Extremely dIfficult  Not difficult at all       04/09/2022    9:17 AM 10/08/2021    8:29 AM 07/03/2021    8:09 AM 04/03/2021    8:29 AM  GAD 7 : Generalized Anxiety Score  Nervous, Anxious, on Edge 0 0 0 0  Control/stop worrying 0 0 0 0  Worry too much - different  things 0 0 0 0  Trouble relaxing 0 0 0 0  Restless 0 1 0 0  Easily annoyed or irritable 0 1 1 2   Afraid - awful might happen 0 0 0 0  Total GAD 7 Score 0 2 1 2   Anxiety Difficulty Not difficult at all Somewhat difficult Not difficult at all Not difficult at all   Relevant past medical, surgical, family and social history reviewed and updated as indicated. Interim medical history since our last visit reviewed. Allergies and medications reviewed and updated.  Review of Systems  Constitutional:  Negative for activity change, diaphoresis, fatigue and fever.  Respiratory:  Negative for cough, chest tightness, shortness of breath and wheezing.   Cardiovascular:  Negative for chest pain, palpitations and leg swelling.  Gastrointestinal: Negative.   Endocrine: Negative for cold intolerance, heat intolerance, polydipsia, polyphagia and polyuria.  Neurological: Negative.   Psychiatric/Behavioral: Negative.      Per HPI unless specifically indicated above     Objective:    BP 121/67   Pulse (!) 57   Temp 98.1 F (36.7 C) (Oral)   Ht 5' 5.98" (1.676 m)   Wt 166 lb 6.4 oz (75.5 kg)   SpO2 90%   BMI 26.87 kg/m   Wt Readings from Last 3 Encounters:  04/09/22 166 lb 6.4 oz (75.5 kg)  10/30/21 172 lb (78 kg)  10/08/21 175 lb (79.4 kg)    Physical Exam Vitals and nursing note reviewed.  Constitutional:      General: He is awake. He is not in acute distress.    Appearance: He is well-developed and well-groomed. He is not ill-appearing or toxic-appearing.  HENT:     Head: Normocephalic and atraumatic.     Right Ear: Hearing, ear canal and external ear normal. No drainage. A middle ear effusion is present. Tympanic membrane is not injected or perforated.     Left Ear: Hearing, ear canal and external ear normal. No drainage. A middle ear effusion is present. Tympanic membrane is not injected or perforated.     Nose: Rhinorrhea present. Rhinorrhea is clear.     Right Sinus: No maxillary  sinus tenderness or frontal sinus tenderness.     Left Sinus: No maxillary sinus tenderness or frontal sinus tenderness.     Mouth/Throat:     Mouth: Mucous membranes are moist.     Pharynx: Posterior oropharyngeal erythema (mild) present. No  pharyngeal swelling or oropharyngeal exudate.  Eyes:     General: Lids are normal.        Right eye: No discharge.        Left eye: No discharge.     Conjunctiva/sclera: Conjunctivae normal.     Pupils: Pupils are equal, round, and reactive to light.  Neck:     Thyroid: No thyromegaly.     Vascular: No carotid bruit.  Cardiovascular:     Rate and Rhythm: Normal rate and regular rhythm.     Heart sounds: Normal heart sounds, S1 normal and S2 normal. No murmur heard.    No gallop.  Pulmonary:     Effort: Pulmonary effort is normal. No accessory muscle usage or respiratory distress.     Breath sounds: Wheezing present. No decreased breath sounds or rhonchi.     Comments: Expiratory wheezes noted throughout.  No rhonchi. Abdominal:     General: Bowel sounds are normal.     Palpations: Abdomen is soft.  Musculoskeletal:        General: Normal range of motion.     Cervical back: Normal range of motion and neck supple.     Right lower leg: No edema.     Left lower leg: No edema.  Skin:    General: Skin is warm and dry.     Capillary Refill: Capillary refill takes less than 2 seconds.  Neurological:     Mental Status: He is alert and oriented to person, place, and time.     Deep Tendon Reflexes: Reflexes are normal and symmetric.  Psychiatric:        Attention and Perception: Attention normal.        Mood and Affect: Mood normal.        Behavior: Behavior normal. Behavior is cooperative.        Thought Content: Thought content normal.    Results for orders placed or performed in visit on 03/24/22  Cologuard  Result Value Ref Range   Cologuard Negative Negative      Assessment & Plan:   Problem List Items Addressed This Visit        Cardiovascular and Mediastinum   Hypertension associated with diabetes (HCC)    Chronic, stable.  BP well below goal in office and at home. Continue Lisinopril 20 MG and Amlodipine 5 MG daily -- refills sent in. Have advised he continue using Meloxicam minimally and discussed risks with consistent use.  Monitor BP at home daily and focus on DASH diet.  Labs today: CMP.   Return in 3 months for T2DM + HTN/HLD.        Relevant Medications   amLODipine (NORVASC) 5 MG tablet   lisinopril (ZESTRIL) 20 MG tablet   rosuvastatin (CRESTOR) 20 MG tablet   metFORMIN (GLUCOPHAGE) 1000 MG tablet   Other Relevant Orders   Bayer DCA Hb A1c Waived   Comprehensive metabolic panel     Endocrine   Hyperlipidemia associated with type 2 diabetes mellitus (HCC)    Chronic, ongoing.  Continue current medication regimen and adjust as needed.  Lipid panel today.      Relevant Medications   amLODipine (NORVASC) 5 MG tablet   lisinopril (ZESTRIL) 20 MG tablet   rosuvastatin (CRESTOR) 20 MG tablet   metFORMIN (GLUCOPHAGE) 1000 MG tablet   Other Relevant Orders   Bayer DCA Hb A1c Waived   Comprehensive metabolic panel   Lipid Panel w/o Chol/HDL Ratio   Type 2 diabetes mellitus with diabetic  neuropathy, without long-term current use of insulin (HCC) - Primary    Chronic, ongoing.  A1c 7.7% today, upward from previous 6.6% due to diet over holidays -- often is in 6 range and stable.  Urine ALB 30 and A:C 30-300 in March 2023, will continue Lisinopril for kidney protection.  Continue current medication regimen and adjust as needed -- change Metformin to 1000 MG pills to minimize pill burden.  Recommend continued focus on daily exercise, which he has started again, and healthy diet.  Return in 3 months for T2DM.  Could consider addition of Farxiga or Jardiance if elevations in future.      Relevant Medications   lisinopril (ZESTRIL) 20 MG tablet   rosuvastatin (CRESTOR) 20 MG tablet   metFORMIN (GLUCOPHAGE) 1000  MG tablet   Other Relevant Orders   Bayer DCA Hb A1c Waived     Other   Acute cough    Acute for one week after initial viral illness.  At this time start Augmentin BID for 7 days and Albuterol inhaler sent in to use as needed.  Stop Dayquil and use Diabetic Tussin.  Recommend: - Increased rest - Increasing Fluids - Acetaminophen as needed for fever/pain.  - Salt water gargling, chloraseptic spray and throat lozenges - Mucinex.  - Humidifying the air.  Return to office as needed if worsening or ongoing.      RESOLVED: BMI 28.0-28.9,adult   Low testosterone in male    Continue collaboration with urology and current treatment as ordered by them.        Follow up plan: Return in about 3 months (around 07/09/2022) for T2DM, HTN/HLD.

## 2022-04-09 NOTE — Patient Instructions (Signed)
Diabetes Mellitus Basics  Diabetes mellitus, or diabetes, is a long-term (chronic) disease. It occurs when the body does not properly use sugar (glucose) that is released from food after you eat. Diabetes mellitus may be caused by one or both of these problems: Your pancreas does not make enough of a hormone called insulin. Your body does not react in a normal way to the insulin that it makes. Insulin lets glucose enter cells in your body. This gives you energy. If you have diabetes, glucose cannot get into cells. This causes high blood glucose (hyperglycemia). How to treat and manage diabetes You may need to take insulin or other diabetes medicines daily to keep your glucose in balance. If you are prescribed insulin, you will learn how to give yourself insulin by injection. You may need to adjust the amount of insulin you take based on the foods that you eat. You will need to check your blood glucose levels using a glucose monitor as told by your health care provider. The readings can help determine if you have low or high blood glucose. Generally, you should have these blood glucose levels: Before meals (preprandial): 80-130 mg/dL (4.4-7.2 mmol/L). After meals (postprandial): below 180 mg/dL (10 mmol/L). Hemoglobin A1c (HbA1c) level: less than 7%. Your health care provider will set treatment goals for you. Keep all follow-up visits. This is important. Follow these instructions at home: Diabetes medicines Take your diabetes medicines every day as told by your health care provider. List your diabetes medicines here: Name of medicine: ______________________________ Amount (dose): _______________ Time (a.m./p.m.): _______________ Notes: ___________________________________ Name of medicine: ______________________________ Amount (dose): _______________ Time (a.m./p.m.): _______________ Notes: ___________________________________ Name of medicine: ______________________________ Amount (dose):  _______________ Time (a.m./p.m.): _______________ Notes: ___________________________________ Insulin If you use insulin, list the types of insulin you use here: Insulin type: ______________________________ Amount (dose): _______________ Time (a.m./p.m.): _______________Notes: ___________________________________ Insulin type: ______________________________ Amount (dose): _______________ Time (a.m./p.m.): _______________ Notes: ___________________________________ Insulin type: ______________________________ Amount (dose): _______________ Time (a.m./p.m.): _______________ Notes: ___________________________________ Insulin type: ______________________________ Amount (dose): _______________ Time (a.m./p.m.): _______________ Notes: ___________________________________ Insulin type: ______________________________ Amount (dose): _______________ Time (a.m./p.m.): _______________ Notes: ___________________________________ Managing blood glucose  Check your blood glucose levels using a glucose monitor as told by your health care provider. Write down the times that you check your glucose levels here: Time: _______________ Notes: ___________________________________ Time: _______________ Notes: ___________________________________ Time: _______________ Notes: ___________________________________ Time: _______________ Notes: ___________________________________ Time: _______________ Notes: ___________________________________ Time: _______________ Notes: ___________________________________  Low blood glucose Low blood glucose (hypoglycemia) is when glucose is at or below 70 mg/dL (3.9 mmol/L). Symptoms may include: Feeling: Hungry. Sweaty and clammy. Irritable or easily upset. Dizzy. Sleepy. Having: A fast heartbeat. A headache. A change in your vision. Numbness around the mouth, lips, or tongue. Having trouble with: Moving (coordination). Sleeping. Treating low blood glucose To treat low blood  glucose, eat or drink something containing sugar right away. If you can think clearly and swallow safely, follow the 15:15 rule: Take 15 grams of a fast-acting carb (carbohydrate), as told by your health care provider. Some fast-acting carbs are: Glucose tablets: take 3-4 tablets. Hard candy: eat 3-5 pieces. Fruit juice: drink 4 oz (120 mL). Regular (not diet) soda: drink 4-6 oz (120-180 mL). Honey or sugar: eat 1 Tbsp (15 mL). Check your blood glucose levels 15 minutes after you take the carb. If your glucose is still at or below 70 mg/dL (3.9 mmol/L), take 15 grams of a carb again. If your glucose does not go above 70 mg/dL (3.9 mmol/L) after   3 tries, get help right away. After your glucose goes back to normal, eat a meal or a snack within 1 hour. Treating very low blood glucose If your glucose is at or below 54 mg/dL (3 mmol/L), you have very low blood glucose (severe hypoglycemia). This is an emergency. Do not wait to see if the symptoms will go away. Get medical help right away. Call your local emergency services (911 in the [place] Do not drive yourself to the hospital. Questions to ask your health care provider Should I talk with a diabetes educator? What equipment will I need to care for myself at home? What diabetes medicines do I need? When should I take them? How often do I need to check my blood glucose levels? What number can I call if I have questions? When is my follow-up visit? Where can I find a support group for people with diabetes? Where to find more information American Diabetes Association: www.diabetes.org Association of Diabetes Care and Education Specialists: www.diabeteseducator.org Contact a health care provider if: Your blood glucose is at or above 240 mg/dL (13.3 mmol/L) for 2 days in a row. You have been sick or have had a fever for 2 days or more, and you are not getting better. You have any of these problems for more than 6 hours: You cannot eat or  drink. You feel nauseous. You vomit. You have diarrhea. Get help right away if: Your blood glucose is lower than 54 mg/dL (3 mmol/L). You get confused. You have trouble thinking clearly. You have trouble breathing. These symptoms may represent a serious problem that is an emergency. Do not wait to see if the symptoms will go away. Get medical help right away. Call your local emergency services (911 in the [place] Do not drive yourself to the hospital. Summary Diabetes mellitus is a chronic disease that occurs when the body does not properly use sugar (glucose) that is released from food after you eat. Take insulin and diabetes medicines as told. Check your blood glucose every day, as often as told. Keep all follow-up visits. This is important. This information is not intended to replace advice given to you by your health care provider. Make sure you discuss any questions you have with your health care provider. Document Revised: 08/01/2019 Document Reviewed: 08/01/2019 Elsevier Patient Education  2023 Elsevier Inc.  

## 2022-04-09 NOTE — Assessment & Plan Note (Signed)
Chronic, stable.  BP well below goal in office and at home. Continue Lisinopril 20 MG and Amlodipine 5 MG daily -- refills sent in. Have advised he continue using Meloxicam minimally and discussed risks with consistent use.  Monitor BP at home daily and focus on DASH diet.  Labs today: CMP.   Return in 3 months for T2DM + HTN/HLD.

## 2022-04-09 NOTE — Assessment & Plan Note (Signed)
Acute for one week after initial viral illness.  At this time start Augmentin BID for 7 days and Albuterol inhaler sent in to use as needed.  Stop Dayquil and use Diabetic Tussin.  Recommend: - Increased rest - Increasing Fluids - Acetaminophen as needed for fever/pain.  - Salt water gargling, chloraseptic spray and throat lozenges - Mucinex.  - Humidifying the air.  Return to office as needed if worsening or ongoing.

## 2022-04-09 NOTE — Assessment & Plan Note (Signed)
Chronic, ongoing.  A1c 7.7% today, upward from previous 6.6% due to diet over holidays -- often is in 6 range and stable.  Urine ALB 30 and A:C 30-300 in March 2023, will continue Lisinopril for kidney protection.  Continue current medication regimen and adjust as needed -- change Metformin to 1000 MG pills to minimize pill burden.  Recommend continued focus on daily exercise, which he has started again, and healthy diet.  Return in 3 months for T2DM.  Could consider addition of Farxiga or Jardiance if elevations in future.

## 2022-04-09 NOTE — Assessment & Plan Note (Signed)
Chronic, ongoing.  Continue current medication regimen and adjust as needed. Lipid panel today. 

## 2022-04-09 NOTE — Assessment & Plan Note (Signed)
Continue collaboration with urology and current treatment as ordered by them. 

## 2022-04-10 ENCOUNTER — Encounter: Payer: Self-pay | Admitting: Nurse Practitioner

## 2022-04-10 LAB — COMPREHENSIVE METABOLIC PANEL
ALT: 32 IU/L (ref 0–44)
AST: 22 IU/L (ref 0–40)
Albumin/Globulin Ratio: 1.7 (ref 1.2–2.2)
Albumin: 4.2 g/dL (ref 3.9–4.9)
Alkaline Phosphatase: 78 IU/L (ref 44–121)
BUN/Creatinine Ratio: 8 — ABNORMAL LOW (ref 10–24)
BUN: 9 mg/dL (ref 8–27)
Bilirubin Total: 0.6 mg/dL (ref 0.0–1.2)
CO2: 26 mmol/L (ref 20–29)
Calcium: 9.5 mg/dL (ref 8.6–10.2)
Chloride: 98 mmol/L (ref 96–106)
Creatinine, Ser: 1.07 mg/dL (ref 0.76–1.27)
Globulin, Total: 2.5 g/dL (ref 1.5–4.5)
Glucose: 148 mg/dL — ABNORMAL HIGH (ref 70–99)
Potassium: 4.2 mmol/L (ref 3.5–5.2)
Sodium: 139 mmol/L (ref 134–144)
Total Protein: 6.7 g/dL (ref 6.0–8.5)
eGFR: 75 mL/min/{1.73_m2} (ref >59)

## 2022-04-10 LAB — LIPID PANEL W/O CHOL/HDL RATIO
Cholesterol, Total: 89 mg/dL — ABNORMAL LOW (ref 100–199)
HDL: 27 mg/dL — ABNORMAL LOW (ref >39)
LDL Chol Calc (NIH): 36 mg/dL (ref 0–99)
Triglycerides: 148 mg/dL (ref 0–149)
VLDL Cholesterol Cal: 26 mg/dL (ref 5–40)

## 2022-04-10 NOTE — Progress Notes (Signed)
Good afternoon, please send out lab letter to [facility].  It is in the tray.:)

## 2022-04-29 ENCOUNTER — Other Ambulatory Visit: Payer: Self-pay | Admitting: Family Medicine

## 2022-04-29 ENCOUNTER — Other Ambulatory Visit: Payer: Medicare Other

## 2022-04-29 DIAGNOSIS — E1169 Type 2 diabetes mellitus with other specified complication: Secondary | ICD-10-CM | POA: Diagnosis not present

## 2022-04-29 DIAGNOSIS — E785 Hyperlipidemia, unspecified: Secondary | ICD-10-CM | POA: Diagnosis not present

## 2022-04-29 DIAGNOSIS — L989 Disorder of the skin and subcutaneous tissue, unspecified: Secondary | ICD-10-CM | POA: Diagnosis not present

## 2022-04-29 DIAGNOSIS — E349 Endocrine disorder, unspecified: Secondary | ICD-10-CM

## 2022-04-29 DIAGNOSIS — I152 Hypertension secondary to endocrine disorders: Secondary | ICD-10-CM | POA: Diagnosis not present

## 2022-04-29 DIAGNOSIS — E1140 Type 2 diabetes mellitus with diabetic neuropathy, unspecified: Secondary | ICD-10-CM | POA: Diagnosis not present

## 2022-04-29 DIAGNOSIS — E119 Type 2 diabetes mellitus without complications: Secondary | ICD-10-CM | POA: Diagnosis not present

## 2022-04-29 DIAGNOSIS — N138 Other obstructive and reflux uropathy: Secondary | ICD-10-CM

## 2022-04-29 DIAGNOSIS — Z7984 Long term (current) use of oral hypoglycemic drugs: Secondary | ICD-10-CM | POA: Diagnosis not present

## 2022-04-29 DIAGNOSIS — E1159 Type 2 diabetes mellitus with other circulatory complications: Secondary | ICD-10-CM | POA: Diagnosis not present

## 2022-04-30 LAB — HEMOGLOBIN: Hemoglobin: 15.3 g/dL (ref 13.0–17.7)

## 2022-04-30 LAB — HEMATOCRIT: Hematocrit: 44.4 % (ref 37.5–51.0)

## 2022-04-30 LAB — TESTOSTERONE: Testosterone: 174 ng/dL — ABNORMAL LOW (ref 264–916)

## 2022-04-30 LAB — PSA: Prostate Specific Ag, Serum: 0.5 ng/mL (ref 0.0–4.0)

## 2022-05-04 NOTE — Progress Notes (Signed)
10/30/2021 10:59 AM   Lawrence Jensen 17-Aug-1950 [id number]  Referring provider: Marjie Skiff, NP [id number] [place],  [place] [address]  Urological history: 1.  Testosterone deficiency -Contributing factors of age and diabetes -Testosterone level (04/2022) 174 -Hemoglobin/hematocrit (04/2022)- 15.3/44.4 -Testosterone cypionate 0.5 cc every 7 days   2. ED -Contributing factors of age, testosterone deficiency, diabetes, hypertension, hyperlipidemia and history of smoking -failed PDE5i's, ICI and VED  3. BPH with LU TS -PSA (04/2022) 0.5 -I PSS 8/2  4. Family history of prostate cancer -Father underwent a prostatectomy at age 36 for prostate cancer   Chief Complaint  Patient presents with   Follow-up    HPI: Lawrence Jensen is a 72 y.o. male who presents today for six month follow up.  He feels more energy, since he has started the injections.    He has no urinary complaints at this time.  Patient denies any modifying or aggravating factors.  Patient denies any gross hematuria, dysuria or suprapubic/flank pain.  Patient denies any fevers, chills, nausea or vomiting.     IPSS     Row Name 05/05/22 1000         International Prostate Symptom Score   How often have you had the sensation of not emptying your bladder? Not at All     How often have you had to urinate less than every two hours? Less than half the time     How often have you found you stopped and started again several times when you urinated? Not at All     How often have you found it difficult to postpone urination? More than half the time     How often have you had a weak urinary stream? Less than 1 in 5 times     How often have you had to strain to start urination? Not at All     How many times did you typically get up at night to urinate? 1 Time     Total IPSS Score 8       Quality of Life due to urinary symptoms   If you were to spend the rest of your life with your urinary condition  just the way it is now how would you feel about that? Mostly Satisfied               Score:  1-7 Mild 8-19 Moderate 20-35 Severe   PMH: Past Medical History:  Diagnosis Date   Diabetes mellitus without complication [facility])    ED (erectile dysfunction)    Hyperlipidemia    Hypertension     Surgical History: Past Surgical History:  Procedure Laterality Date   APPENDECTOMY  04/13/1997   TOOTH EXTRACTION      Home Medications:  Allergies as of 05/05/2022   No Known Allergies      Medication List        Accurate as of May 05, 2022 10:59 AM. If you have any questions, ask your nurse or doctor.          STOP taking these medications    albuterol 108 (90 Base) MCG/ACT inhaler Commonly known as: VENTOLIN HFA Stopped by: Ellese Julius, PA-C       TAKE these medications    2-3CC SYRINGE 3 ML Misc 1 mg by Does not apply route once a week.   Accu-Chek Aviva Plus test strip Generic drug: glucose blood USE AS INSTRUCTED   AMBULATORY NON FORMULARY MEDICATION Trimix (30/1/10)-(Pap/Phent/PGE)  Dosage: Inject 4 cc per injection   Vial 59ml  Qty #10 Refills 6  [place] [phone] Fax [phone] Started by: Zara Council, PA-C   amLODipine 5 MG tablet Commonly known as: NORVASC Take 1 tablet (5 mg total) by mouth daily.   aspirin EC 81 MG tablet Take 81 mg by mouth daily.   BD Disp Needles 18G X 1-1/2" Misc Generic drug: NEEDLE (DISP) 18 G 1 mg by Does not apply route once a week.   BD Disp Needles 21G X 1-1/2" Misc Generic drug: NEEDLE (DISP) 21 G 1 mg by Does not apply route once a week.   COD LIVER OIL PO Take 3 tablets by mouth daily.   lisinopril 20 MG tablet Commonly known as: ZESTRIL Take 1 tablet (20 mg total) by mouth daily.   Magnesium 400 MG Tabs Take 400 mg by mouth 2 (two) times daily.   meloxicam 15 MG tablet Commonly known as: MOBIC Take 1 tablet (15 mg total) by mouth daily.   metFORMIN 1000 MG  tablet Commonly known as: GLUCOPHAGE Take 1 tablet (1,000 mg total) by mouth 2 (two) times daily with a meal.   ONE TOUCH ULTRA 2 w/Device Kit Inject 1 kit into the skin 3 (three) times daily.   OT ULTRA/FASTTK CNTRL SOLN Soln   rosuvastatin 20 MG tablet Commonly known as: CRESTOR Take 1 tablet (20 mg total) by mouth daily.   testosterone cypionate 200 MG/ML injection Commonly known as: DEPOTESTOSTERONE CYPIONATE Inject 0.5 cc (100 mg) every 7 days        Allergies: No Known Allergies  Family History: Family History  Problem Relation Age of Onset   Heart disease Mother    Heart disease Father    Congestive Heart Failure Father    Cancer Father        prostate   Diabetes Father    Stroke Father    Hypertension Father    Cancer Brother    Stroke Maternal Grandmother    Heart disease Brother    COPD Neg Hx     Social History:  reports that he quit smoking about 27 years ago. His smoking use included cigarettes. He has a 35.00 pack-year smoking history. He has never used smokeless tobacco. He reports that he does not drink alcohol and does not use drugs.  ROS: Pertinent ROS in HPI  Physical Exam: BP 119/69   Pulse 61   Ht 5\' 6"  (1.676 m)   Wt 160 lb (72.6 kg)   BMI 25.82 kg/m   Constitutional:  Well nourished. Alert and oriented, No acute distress. HEENT: [place] AT, moist mucus membranes.  Trachea midline Cardiovascular: No clubbing, cyanosis, or edema. Respiratory: Normal respiratory effort, no increased work of breathing. Neurologic: Grossly intact, no focal deficits, moving all 4 extremities. Psychiatric: Normal mood and affect.   Laboratory Data: Lab Results  Component Value Date   WBC 4.7 10/08/2021   HGB 15.3 04/29/2022   HCT 44.4 04/29/2022   MCV 90 10/08/2021   PLT 238 10/08/2021    Lab Results  Component Value Date   CREATININE 1.07 04/09/2022    Lab Results  Component Value Date   TESTOSTERONE 174 (L) 04/29/2022    Lab Results   Component Value Date   HGBA1C 7.7 (H) 04/09/2022    Lab Results  Component Value Date   TSH 1.840 10/08/2021       Component Value Date/Time   CHOL 89 (L) 04/09/2022 0830   CHOL 99  03/01/2018 0836   HDL 27 (L) 04/09/2022 0830   VLDL 17 03/01/2018 0836   LDLCALC 36 04/09/2022 0830    Lab Results  Component Value Date   AST 22 04/09/2022   Lab Results  Component Value Date   ALT 32 04/09/2022   Component     Latest Ref Rng 04/29/2022  Prostate Specific Ag, Serum     0.0 - 4.0 ng/mL 0.5    I have reviewed the labs.   Pertinent Imaging: No results found for any visits on 05/05/22.   Assessment & Plan:    1. Testosterone deficiency  -testosterone levels are subtherapeutic -H & H WNL -increase Testosterone cypionate to 0.5 cc (100 mg) every 10 days   2. BPH with LUTS -PSA stable -continue conservative management, avoiding bladder irritants and timed voiding's  3. Erectile dysfunction:    - failed PDE5i's, VED and ICI - neuropathy is abating, so he would like another prescription sent for Trimix   Return in about 3 months (around 08/04/2022) for testosterone (one week after injection) [facility]  [place] [address], [place] [address] [phone]

## 2022-05-05 ENCOUNTER — Ambulatory Visit: Payer: Medicare Other | Admitting: Urology

## 2022-05-05 ENCOUNTER — Encounter: Payer: Self-pay | Admitting: Urology

## 2022-05-05 VITALS — BP 119/69 | HR 61 | Ht 66.0 in | Wt 160.0 lb

## 2022-05-05 DIAGNOSIS — E291 Testicular hypofunction: Secondary | ICD-10-CM

## 2022-05-05 DIAGNOSIS — N138 Other obstructive and reflux uropathy: Secondary | ICD-10-CM

## 2022-05-05 DIAGNOSIS — N401 Enlarged prostate with lower urinary tract symptoms: Secondary | ICD-10-CM | POA: Diagnosis not present

## 2022-05-05 DIAGNOSIS — E349 Endocrine disorder, unspecified: Secondary | ICD-10-CM

## 2022-05-05 DIAGNOSIS — N529 Male erectile dysfunction, unspecified: Secondary | ICD-10-CM | POA: Diagnosis not present

## 2022-05-05 MED ORDER — AMBULATORY NON FORMULARY MEDICATION: 0 refills | Status: DC

## 2022-05-05 MED ORDER — TESTOSTERONE CYPIONATE 200 MG/ML IM SOLN: INTRAMUSCULAR | 0 refills | Status: DC

## 2022-07-04 NOTE — Patient Instructions (Signed)
Be Involved in Your Health Care:  Taking Medications When medications are taken as directed, they can greatly improve your health. But if they are not taken as instructed, they may not work. In some cases, not taking them correctly can be harmful. To help ensure your treatment remains effective and safe, understand your medications and how to take them.  Your lab results, notes and after visit summary will be available on My Chart. We strongly encourage you to use this feature. If lab results are abnormal the clinic will contact you with the appropriate steps. If the clinic does not contact you assume the results are satisfactory. You can always see your results on My Chart. If you have questions regarding your condition, please contact the clinic during office hours. You can also ask questions on My Chart.  We at [facility] are grateful that you chose us to provide care. We strive to provide excellent and compassionate care and are always looking for feedback. If you get a survey from the clinic please complete this.   Diabetes Mellitus Basics  Diabetes mellitus, or diabetes, is a long-term (chronic) disease. It occurs when the body does not properly use sugar (glucose) that is released from food after you eat. Diabetes mellitus may be caused by one or both of these problems: Your pancreas does not make enough of a hormone called insulin. Your body does not react in a normal way to the insulin that it makes. Insulin lets glucose enter cells in your body. This gives you energy. If you have diabetes, glucose cannot get into cells. This causes high blood glucose (hyperglycemia). How to treat and manage diabetes You may need to take insulin or other diabetes medicines daily to keep your glucose in balance. If you are prescribed insulin, you will learn how to give yourself insulin by injection. You may need to adjust the amount of insulin you take based on the foods that you eat. You will  need to check your blood glucose levels using a glucose monitor as told by your health care provider. The readings can help determine if you have low or high blood glucose. Generally, you should have these blood glucose levels: Before meals (preprandial): 80-130 mg/dL (4.4-7.2 mmol/L). After meals (postprandial): below 180 mg/dL (10 mmol/L). Hemoglobin A1c (HbA1c) level: less than 7%. Your health care provider will set treatment goals for you. Keep all follow-up visits. This is important. Follow these instructions at home: Diabetes medicines Take your diabetes medicines every day as told by your health care provider. List your diabetes medicines here: Name of medicine: ______________________________ Amount (dose): _______________ Time (a.m./p.m.): _______________ Notes: ___________________________________ Name of medicine: ______________________________ Amount (dose): _______________ Time (a.m./p.m.): _______________ Notes: ___________________________________ Name of medicine: ______________________________ Amount (dose): _______________ Time (a.m./p.m.): _______________ Notes: ___________________________________ Insulin If you use insulin, list the types of insulin you use here: Insulin type: ______________________________ Amount (dose): _______________ Time (a.m./p.m.): _______________Notes: ___________________________________ Insulin type: ______________________________ Amount (dose): _______________ Time (a.m./p.m.): _______________ Notes: ___________________________________ Insulin type: ______________________________ Amount (dose): _______________ Time (a.m./p.m.): _______________ Notes: ___________________________________ Insulin type: ______________________________ Amount (dose): _______________ Time (a.m./p.m.): _______________ Notes: ___________________________________ Insulin type: ______________________________ Amount (dose): _______________ Time (a.m./p.m.): _______________  Notes: ___________________________________ Managing blood glucose  Check your blood glucose levels using a glucose monitor as told by your health care provider. Write down the times that you check your glucose levels here: Time: _______________ Notes: ___________________________________ Time: _______________ Notes: ___________________________________ Time: _______________ Notes: ___________________________________ Time: _______________ Notes: ___________________________________ Time: _______________ Notes: ___________________________________ Time: _______________ Notes: ___________________________________    Low blood glucose Low blood glucose (hypoglycemia) is when glucose is at or below 70 mg/dL (3.9 mmol/L). Symptoms may include: Feeling: Hungry. Sweaty and clammy. Irritable or easily upset. Dizzy. Sleepy. Having: A fast heartbeat. A headache. A change in your vision. Numbness around the mouth, lips, or tongue. Having trouble with: Moving (coordination). Sleeping. Treating low blood glucose To treat low blood glucose, eat or drink something containing sugar right away. If you can think clearly and swallow safely, follow the 15:15 rule: Take 15 grams of a fast-acting carb (carbohydrate), as told by your health care provider. Some fast-acting carbs are: Glucose tablets: take 3-4 tablets. Hard candy: eat 3-5 pieces. Fruit juice: drink 4 oz (120 mL). Regular (not diet) soda: drink 4-6 oz (120-180 mL). Honey or sugar: eat 1 Tbsp (15 mL). Check your blood glucose levels 15 minutes after you take the carb. If your glucose is still at or below 70 mg/dL (3.9 mmol/L), take 15 grams of a carb again. If your glucose does not go above 70 mg/dL (3.9 mmol/L) after 3 tries, get help right away. After your glucose goes back to normal, eat a meal or a snack within 1 hour. Treating very low blood glucose If your glucose is at or below 54 mg/dL (3 mmol/L), you have very low blood glucose  (severe hypoglycemia). This is an emergency. Do not wait to see if the symptoms will go away. Get medical help right away. Call your local emergency services (911 in the [place] Do not drive yourself to the hospital. Questions to ask your health care provider Should I talk with a diabetes educator? What equipment will I need to care for myself at home? What diabetes medicines do I need? When should I take them? How often do I need to check my blood glucose levels? What number can I call if I have questions? When is my follow-up visit? Where can I find a support group for people with diabetes? Where to find more information American Diabetes Association: www.diabetes.org Association of Diabetes Care and Education Specialists: www.diabeteseducator.org Contact a health care provider if: Your blood glucose is at or above 240 mg/dL (13.3 mmol/L) for 2 days in a row. You have been sick or have had a fever for 2 days or more, and you are not getting better. You have any of these problems for more than 6 hours: You cannot eat or drink. You feel nauseous. You vomit. You have diarrhea. Get help right away if: Your blood glucose is lower than 54 mg/dL (3 mmol/L). You get confused. You have trouble thinking clearly. You have trouble breathing. These symptoms may represent a serious problem that is an emergency. Do not wait to see if the symptoms will go away. Get medical help right away. Call your local emergency services (911 in the [place] Do not drive yourself to the hospital. Summary Diabetes mellitus is a chronic disease that occurs when the body does not properly use sugar (glucose) that is released from food after you eat. Take insulin and diabetes medicines as told. Check your blood glucose every day, as often as told. Keep all follow-up visits. This is important. This information is not intended to replace advice given to you by your health care provider. Make sure you discuss any  questions you have with your health care provider. Document Revised: 08/01/2019 Document Reviewed: 08/01/2019 Elsevier Patient Education  2023 [employer]vier Inc.  

## 2022-07-09 ENCOUNTER — Encounter: Payer: Self-pay | Admitting: Nurse Practitioner

## 2022-07-09 ENCOUNTER — Ambulatory Visit (INDEPENDENT_AMBULATORY_CARE_PROVIDER_SITE_OTHER): Payer: Medicare Other | Admitting: Nurse Practitioner

## 2022-07-09 VITALS — BP 115/69 | HR 54 | Temp 97.6°F | Ht 65.98 in | Wt 173.8 lb

## 2022-07-09 DIAGNOSIS — E1159 Type 2 diabetes mellitus with other circulatory complications: Secondary | ICD-10-CM | POA: Diagnosis not present

## 2022-07-09 DIAGNOSIS — I152 Hypertension secondary to endocrine disorders: Secondary | ICD-10-CM

## 2022-07-09 DIAGNOSIS — R7989 Other specified abnormal findings of blood chemistry: Secondary | ICD-10-CM

## 2022-07-09 DIAGNOSIS — E1169 Type 2 diabetes mellitus with other specified complication: Secondary | ICD-10-CM | POA: Diagnosis not present

## 2022-07-09 DIAGNOSIS — E785 Hyperlipidemia, unspecified: Secondary | ICD-10-CM | POA: Diagnosis not present

## 2022-07-09 DIAGNOSIS — E1140 Type 2 diabetes mellitus with diabetic neuropathy, unspecified: Secondary | ICD-10-CM | POA: Diagnosis not present

## 2022-07-09 LAB — MICROALBUMIN, URINE WAIVED
Creatinine, Urine Waived: 10 mg/dL (ref 10–300)
Microalb, Ur Waived: 30 mg/L — ABNORMAL HIGH (ref 0–19)

## 2022-07-09 LAB — BAYER DCA HB A1C WAIVED: HB A1C (BAYER DCA - WAIVED): 5.9 % — ABNORMAL HIGH (ref 4.8–5.6)

## 2022-07-09 NOTE — Assessment & Plan Note (Signed)
Continue collaboration with urology and current treatment as ordered by them.

## 2022-07-09 NOTE — Assessment & Plan Note (Addendum)
Chronic, stable.  BP well below goal in office and at home. Continue Lisinopril 20 MG and Amlodipine 5 MG daily. Have advised he continue using Meloxicam minimally and discussed risks with consistent use.  Monitor BP at home daily and focus on DASH diet.  Labs today: CMP and urine ALB.  Urine ALB 11 July 2022.   Return in 3 months for physical.

## 2022-07-09 NOTE — Assessment & Plan Note (Signed)
Chronic, ongoing.  Continue current medication regimen and adjust as needed. Lipid panel today. 

## 2022-07-09 NOTE — Assessment & Plan Note (Signed)
Chronic, ongoing.  A1c 5.9% today, downward from previous 7.7% due to diet changes (praised for this) -- often is in 6 range and stable.  Urine ALB 11 July 2022, will continue Lisinopril for kidney protection.  Continue current medication regimen and adjust as needed.  Recommend continued focus on daily exercise, which he has started again, and healthy diet.  Return in 3 months for T2DM.  Could consider addition of Farxiga or Jardiance if elevations in future.

## 2022-07-09 NOTE — Progress Notes (Signed)
BP 115/69   Pulse (!) 54   Temp 97.6 F (36.4 C) (Oral)   Ht 5' 5.98" (1.676 m)   Wt 173 lb 12.8 oz (78.8 kg)   SpO2 95%   BMI 28.07 kg/m    Subjective:    Patient ID: Lawrence Jensen, male    DOB: 1950-05-11, 72 y.o.   MRN: [id number]  HPI: Lawrence Jensen is a 72 y.o. male  Chief Complaint  Patient presents with   Diabetes   Hypertension   Hyperlipidemia   Currently being followed by urology, Dr. Bernardo Heater, for low testosterone and is getting replacement -- last visit 05/05/22.    DIABETES Takes Metformin 1000 MG two times a day.  Last A1c 7.7%, trend up for him.  Does endorse cheating on diet over the holidays.  Since last visit has been working on major diet changes. Hypoglycemic episodes:no Polydipsia/polyuria: no Visual disturbance: no Chest pain: no Paresthesias: no Glucose Monitoring: yes             Accucheck frequency: Daily in morning             Fasting glucose: <130 on average, had a 103             Post prandial:             Evening:             Before meals: Taking Insulin?: no             Long acting insulin:             Short acting insulin: Blood Pressure Monitoring: daily Retinal Examination: Up to Date -- [place] Exam: Up to Date Pneumovax: refused Influenza: refused Aspirin: yes    HYPERTENSION / HYPERLIPIDEMIA Continues on Lisinopril 20 MG, Amlodipine 5 MG + Crestor 20 MG daily.  Satisfied with current treatment? yes Duration of hypertension: chronic BP monitoring frequency: every morning BP range: 110-120/70 range at home BP medication side effects: no Duration of hyperlipidemia: chronic Cholesterol medication side effects: no Cholesterol supplements: fish oil Medication compliance: good compliance Aspirin: yes Recent stressors: no Recurrent headaches: no Visual changes: no Palpitations: no Dyspnea: no Chest pain: no Lower extremity edema: no Dizzy/lightheaded: no      07/09/2022    9:57 AM  04/09/2022    9:17 AM 02/17/2022   10:42 AM 10/08/2021    8:29 AM 07/03/2021    8:08 AM  Depression screen PHQ 2/9  Decreased Interest 0 0 0 1 0  Down, Depressed, Hopeless 0 0 0 0 0  PHQ - 2 Score 0 0 0 1 0  Altered sleeping 0 0 0 0 0  Tired, decreased energy 3 0 0 3 3  Change in appetite 0 0 0 0 0  Feeling bad or failure about yourself  0 0 0 0 0  Trouble concentrating 0 0 0 0 0  Moving slowly or fidgety/restless 0 0 0 0 0  Suicidal thoughts 0 0 0 0 0  PHQ-9 Score 3 0 0 4 3  Difficult doing work/chores Very difficult Not difficult at all Not difficult at all Extremely dIfficult        07/09/2022    9:57 AM 04/09/2022    9:17 AM 10/08/2021    8:29 AM 07/03/2021    8:09 AM  GAD 7 : Generalized Anxiety Score  Nervous, Anxious, on Edge 0 0 0 0  Control/stop worrying 0 0 0 0  Worry  too much - different things 0 0 0 0  Trouble relaxing 0 0 0 0  Restless 2 0 1 0  Easily annoyed or irritable 0 0 1 1  Afraid - awful might happen 0 0 0 0  Total GAD 7 Score 2 0 2 1  Anxiety Difficulty Somewhat difficult Not difficult at all Somewhat difficult Not difficult at all   Relevant past medical, surgical, family and social history reviewed and updated as indicated. Interim medical history since our last visit reviewed. Allergies and medications reviewed and updated.  Review of Systems  Constitutional:  Negative for activity change, diaphoresis, fatigue and fever.  Respiratory:  Negative for cough, chest tightness, shortness of breath and wheezing.   Cardiovascular:  Negative for chest pain, palpitations and leg swelling.  Gastrointestinal: Negative.   Endocrine: Negative for cold intolerance, heat intolerance, polydipsia, polyphagia and polyuria.  Neurological: Negative.   Psychiatric/Behavioral: Negative.      Per HPI unless specifically indicated above     Objective:    BP 115/69   Pulse (!) 54   Temp 97.6 F (36.4 C) (Oral)   Ht 5' 5.98" (1.676 m)   Wt 173 lb 12.8 oz (78.8 kg)    SpO2 95%   BMI 28.07 kg/m   Wt Readings from Last 3 Encounters:  07/09/22 173 lb 12.8 oz (78.8 kg)  05/05/22 160 lb (72.6 kg)  04/09/22 166 lb 6.4 oz (75.5 kg)    Physical Exam Vitals and nursing note reviewed.  Constitutional:      General: He is awake. He is not in acute distress.    Appearance: He is well-developed. He is not ill-appearing.  HENT:     Head: Normocephalic and atraumatic.     Right Ear: Hearing normal. No drainage.     Left Ear: Hearing normal. No drainage.  Eyes:     General: Lids are normal.        Right eye: No discharge.        Left eye: No discharge.     Conjunctiva/sclera: Conjunctivae normal.     Pupils: Pupils are equal, round, and reactive to light.  Neck:     Thyroid: No thyromegaly.     Vascular: No carotid bruit.  Cardiovascular:     Rate and Rhythm: Normal rate and regular rhythm.     Heart sounds: Normal heart sounds, S1 normal and S2 normal. No murmur heard.    No gallop.  Pulmonary:     Effort: Pulmonary effort is normal. No accessory muscle usage or respiratory distress.     Breath sounds: Normal breath sounds.  Abdominal:     General: Bowel sounds are normal.     Palpations: Abdomen is soft.  Musculoskeletal:        General: Normal range of motion.     Cervical back: Normal range of motion and neck supple.     Right lower leg: No edema.     Left lower leg: No edema.  Skin:    General: Skin is warm and dry.     Capillary Refill: Capillary refill takes less than 2 seconds.  Neurological:     Mental Status: He is alert and oriented to person, place, and time.     Deep Tendon Reflexes: Reflexes are normal and symmetric.  Psychiatric:        Attention and Perception: Attention normal.        Mood and Affect: Mood normal.        Behavior: Behavior  normal. Behavior is cooperative.        Thought Content: Thought content normal.    Results for orders placed or performed in visit on 04/29/22  Hematocrit  Result Value Ref Range    Hematocrit 44.4 37.5 - 51.0 %  Hemoglobin  Result Value Ref Range   Hemoglobin 15.3 13.0 - 17.7 g/dL  PSA  Result Value Ref Range   Prostate Specific Ag, Serum 0.5 0.0 - 4.0 ng/mL  Testosterone  Result Value Ref Range   Testosterone 174 (L) 264 - 916 ng/dL      Assessment & Plan:   Problem List Items Addressed This Visit       Cardiovascular and Mediastinum   Hypertension associated with diabetes (HCC)    Chronic, stable.  BP well below goal in office and at home. Continue Lisinopril 20 MG and Amlodipine 5 MG daily. Have advised he continue using Meloxicam minimally and discussed risks with consistent use.  Monitor BP at home daily and focus on DASH diet.  Labs today: CMP and urine ALB.  Urine ALB 11 July 2022.   Return in 3 months for physical.      Relevant Orders   Bayer DCA Hb A1c Waived   Microalbumin, Urine Waived   Comprehensive metabolic panel     Endocrine   Hyperlipidemia associated with type 2 diabetes mellitus (HCC)    Chronic, ongoing.  Continue current medication regimen and adjust as needed.  Lipid panel today.      Relevant Orders   Bayer DCA Hb A1c Waived   Comprehensive metabolic panel   Lipid Panel w/o Chol/HDL Ratio   Type 2 diabetes mellitus with diabetic neuropathy, without long-term current use of insulin (HCC) - Primary    Chronic, ongoing.  A1c 5.9% today, downward from previous 7.7% due to diet changes (praised for this) -- often is in 6 range and stable.  Urine ALB 11 July 2022, will continue Lisinopril for kidney protection.  Continue current medication regimen and adjust as needed.  Recommend continued focus on daily exercise, which he has started again, and healthy diet.  Return in 3 months for T2DM.  Could consider addition of Farxiga or Jardiance if elevations in future.      Relevant Orders   Bayer DCA Hb A1c Waived   Microalbumin, Urine Waived     Other   Low testosterone in male    Continue collaboration with urology and current  treatment as ordered by them.        Follow up plan: Return in about 3 months (around 10/12/2022) for [place] after 10/09/22.

## 2022-07-10 LAB — LIPID PANEL W/O CHOL/HDL RATIO
Cholesterol, Total: 114 mg/dL (ref 100–199)
HDL: 40 mg/dL (ref >39)
LDL Chol Calc (NIH): 49 mg/dL (ref 0–99)
Triglycerides: 147 mg/dL (ref 0–149)
VLDL Cholesterol Cal: 25 mg/dL (ref 5–40)

## 2022-07-10 LAB — COMPREHENSIVE METABOLIC PANEL
ALT: 15 IU/L (ref 0–44)
AST: 19 IU/L (ref 0–40)
Albumin/Globulin Ratio: 1.8 (ref 1.2–2.2)
Albumin: 4.6 g/dL (ref 3.9–4.9)
Alkaline Phosphatase: 91 IU/L (ref 44–121)
BUN/Creatinine Ratio: 14 (ref 10–24)
BUN: 16 mg/dL (ref 8–27)
Bilirubin Total: 0.5 mg/dL (ref 0.0–1.2)
CO2: 25 mmol/L (ref 20–29)
Calcium: 10.1 mg/dL (ref 8.6–10.2)
Chloride: 99 mmol/L (ref 96–106)
Creatinine, Ser: 1.13 mg/dL (ref 0.76–1.27)
Globulin, Total: 2.5 g/dL (ref 1.5–4.5)
Glucose: 173 mg/dL — ABNORMAL HIGH (ref 70–99)
Potassium: 4.6 mmol/L (ref 3.5–5.2)
Sodium: 141 mmol/L (ref 134–144)
Total Protein: 7.1 g/dL (ref 6.0–8.5)
eGFR: 70 mL/min/{1.73_m2} (ref >59)

## 2022-07-10 NOTE — Progress Notes (Signed)
Good afternoon, please tell The[place]ford as always his labs look great!!  No changes needed. Keep being amazing!!  Thank you for allowing me to participate in your care.  I appreciate you. Kindest regards, Guerin Lashomb

## 2022-08-05 ENCOUNTER — Other Ambulatory Visit: Payer: Self-pay

## 2022-08-05 DIAGNOSIS — E349 Endocrine disorder, unspecified: Secondary | ICD-10-CM

## 2022-08-06 ENCOUNTER — Other Ambulatory Visit: Payer: Medicare Other

## 2022-08-06 DIAGNOSIS — E349 Endocrine disorder, unspecified: Secondary | ICD-10-CM | POA: Diagnosis not present

## 2022-08-07 ENCOUNTER — Telehealth: Payer: Self-pay | Admitting: Family Medicine

## 2022-08-07 LAB — TESTOSTERONE: Testosterone: 399 ng/dL (ref 264–916)

## 2022-08-07 LAB — HEMOGLOBIN AND HEMATOCRIT, BLOOD
Hematocrit: 48 % (ref 37.5–51.0)
Hemoglobin: 16.7 g/dL (ref 13.0–17.7)

## 2022-08-07 NOTE — Telephone Encounter (Signed)
-----   Message from Harle Battiest, PA-C sent at 08/07/2022  8:09 AM EDT ----- Please let Mr. Savas know that his blood work is within normal parameters and to continue the testosterone 0.5 cc every 10 days.  We need to see him in July for I PSS, SHIM and repeat labs (testosterone 5 days after an injection, PSA, hemoglobin and hematocrit)

## 2022-08-07 NOTE — Telephone Encounter (Signed)
Patient notified and appointments scheduled.  

## 2022-10-09 ENCOUNTER — Encounter: Payer: Medicare Other | Admitting: Nurse Practitioner

## 2022-10-23 ENCOUNTER — Other Ambulatory Visit: Payer: Self-pay | Admitting: Urology

## 2022-10-23 DIAGNOSIS — N529 Male erectile dysfunction, unspecified: Secondary | ICD-10-CM

## 2022-10-23 DIAGNOSIS — N138 Other obstructive and reflux uropathy: Secondary | ICD-10-CM

## 2022-10-23 DIAGNOSIS — E349 Endocrine disorder, unspecified: Secondary | ICD-10-CM

## 2022-10-25 NOTE — Patient Instructions (Signed)
Be Involved in Caring For Your Health:  Taking Medications When medications are taken as directed, they can greatly improve your health. But if they are not taken as prescribed, they may not work. In some cases, not taking them correctly can be harmful. To help ensure your treatment remains effective and safe, understand your medications and how to take them. Bring your medications to each visit for review by your provider.  Your lab results, notes, and after visit summary will be available on My Chart. We strongly encourage you to use this feature. If lab results are abnormal the clinic will contact you with the appropriate steps. If the clinic does not contact you assume the results are satisfactory. You can always view your results on My Chart. If you have questions regarding your health or results, please contact the clinic during office hours. You can also ask questions on My Chart.  We at [facility] are grateful that you chose us to provide your care. We strive to provide evidence-based and compassionate care and are always looking for feedback. If you get a survey from the clinic please complete this so we can hear your opinions.  Diabetes Mellitus Basics  Diabetes mellitus, or diabetes, is a long-term (chronic) disease. It occurs when the body does not properly use sugar (glucose) that is released from food after you eat. Diabetes mellitus may be caused by one or both of these problems: Your pancreas does not make enough of a hormone called insulin. Your body does not react in a normal way to the insulin that it makes. Insulin lets glucose enter cells in your body. This gives you energy. If you have diabetes, glucose cannot get into cells. This causes high blood glucose (hyperglycemia). How to treat and manage diabetes You may need to take insulin or other diabetes medicines daily to keep your glucose in balance. If you are prescribed insulin, you will learn how to give  yourself insulin by injection. You may need to adjust the amount of insulin you take based on the foods that you eat. You will need to check your blood glucose levels using a glucose monitor as told by your health care provider. The readings can help determine if you have low or high blood glucose. Generally, you should have these blood glucose levels: Before meals (preprandial): 80-130 mg/dL (4.4-7.2 mmol/L). After meals (postprandial): below 180 mg/dL (10 mmol/L). Hemoglobin A1c (HbA1c) level: less than 7%. Your health care provider will set treatment goals for you. Keep all follow-up visits. This is important. Follow these instructions at home: Diabetes medicines Take your diabetes medicines every day as told by your health care provider. List your diabetes medicines here: Name of medicine: ______________________________ Amount (dose): _______________ Time (a.m./p.m.): _______________ Notes: ___________________________________ Name of medicine: ______________________________ Amount (dose): _______________ Time (a.m./p.m.): _______________ Notes: ___________________________________ Name of medicine: ______________________________ Amount (dose): _______________ Time (a.m./p.m.): _______________ Notes: ___________________________________ Insulin If you use insulin, list the types of insulin you use here: Insulin type: ______________________________ Amount (dose): _______________ Time (a.m./p.m.): _______________Notes: ___________________________________ Insulin type: ______________________________ Amount (dose): _______________ Time (a.m./p.m.): _______________ Notes: ___________________________________ Insulin type: ______________________________ Amount (dose): _______________ Time (a.m./p.m.): _______________ Notes: ___________________________________ Insulin type: ______________________________ Amount (dose): _______________ Time (a.m./p.m.): _______________ Notes:  ___________________________________ Insulin type: ______________________________ Amount (dose): _______________ Time (a.m./p.m.): _______________ Notes: ___________________________________ Managing blood glucose  Check your blood glucose levels using a glucose monitor as told by your health care provider. Write down the times that you check your glucose levels here: Time: _______________ Notes: ___________________________________   Time: _______________ Notes: ___________________________________ Time: _______________ Notes: ___________________________________ Time: _______________ Notes: ___________________________________ Time: _______________ Notes: ___________________________________ Time: _______________ Notes: ___________________________________  Low blood glucose Low blood glucose (hypoglycemia) is when glucose is at or below 70 mg/dL (3.9 mmol/L). Symptoms may include: Feeling: Hungry. Sweaty and clammy. Irritable or easily upset. Dizzy. Sleepy. Having: A fast heartbeat. A headache. A change in your vision. Numbness around the mouth, lips, or tongue. Having trouble with: Moving (coordination). Sleeping. Treating low blood glucose To treat low blood glucose, eat or drink something containing sugar right away. If you can think clearly and swallow safely, follow the 15:15 rule: Take 15 grams of a fast-acting carb (carbohydrate), as told by your health care provider. Some fast-acting carbs are: Glucose tablets: take 3-4 tablets. Hard candy: eat 3-5 pieces. Fruit juice: drink 4 oz (120 mL). Regular (not diet) soda: drink 4-6 oz (120-180 mL). Honey or sugar: eat 1 Tbsp (15 mL). Check your blood glucose levels 15 minutes after you take the carb. If your glucose is still at or below 70 mg/dL (3.9 mmol/L), take 15 grams of a carb again. If your glucose does not go above 70 mg/dL (3.9 mmol/L) after 3 tries, get help right away. After your glucose goes back to normal, eat a meal  or a snack within 1 hour. Treating very low blood glucose If your glucose is at or below 54 mg/dL (3 mmol/L), you have very low blood glucose (severe hypoglycemia). This is an emergency. Do not wait to see if the symptoms will go away. Get medical help right away. Call your local emergency services (911 in the [place] Do not drive yourself to the hospital. Questions to ask your health care provider Should I talk with a diabetes educator? What equipment will I need to care for myself at home? What diabetes medicines do I need? When should I take them? How often do I need to check my blood glucose levels? What number can I call if I have questions? When is my follow-up visit? Where can I find a support group for people with diabetes? Where to find more information American Diabetes Association: www.diabetes.org Association of Diabetes Care and Education Specialists: www.diabeteseducator.org Contact a health care provider if: Your blood glucose is at or above 240 mg/dL (13.3 mmol/L) for 2 days in a row. You have been sick or have had a fever for 2 days or more, and you are not getting better. You have any of these problems for more than 6 hours: You cannot eat or drink. You feel nauseous. You vomit. You have diarrhea. Get help right away if: Your blood glucose is lower than 54 mg/dL (3 mmol/L). You get confused. You have trouble thinking clearly. You have trouble breathing. These symptoms may represent a serious problem that is an emergency. Do not wait to see if the symptoms will go away. Get medical help right away. Call your local emergency services (911 in the [place] Do not drive yourself to the hospital. Summary Diabetes mellitus is a chronic disease that occurs when the body does not properly use sugar (glucose) that is released from food after you eat. Take insulin and diabetes medicines as told. Check your blood glucose every day, as often as told. Keep all follow-up visits. This  is important. This information is not intended to replace advice given to you by your health care provider. Make sure you discuss any questions you have with your health care provider. Document Revised: 08/01/2019 Document Reviewed: 08/01/2019 Elsevier Patient Education    2024 [employer]  

## 2022-10-26 ENCOUNTER — Other Ambulatory Visit: Payer: Medicare Other

## 2022-10-26 DIAGNOSIS — N138 Other obstructive and reflux uropathy: Secondary | ICD-10-CM

## 2022-10-26 DIAGNOSIS — E349 Endocrine disorder, unspecified: Secondary | ICD-10-CM | POA: Diagnosis not present

## 2022-10-26 DIAGNOSIS — N529 Male erectile dysfunction, unspecified: Secondary | ICD-10-CM

## 2022-10-27 LAB — PSA: Prostate Specific Ag, Serum: 0.4 ng/mL (ref 0.0–4.0)

## 2022-10-27 LAB — HEMOGLOBIN AND HEMATOCRIT, BLOOD
Hematocrit: 46.1 % (ref 37.5–51.0)
Hemoglobin: 15.3 g/dL (ref 13.0–17.7)

## 2022-10-28 ENCOUNTER — Ambulatory Visit (INDEPENDENT_AMBULATORY_CARE_PROVIDER_SITE_OTHER): Payer: Medicare Other | Admitting: Nurse Practitioner

## 2022-10-28 ENCOUNTER — Encounter: Payer: Self-pay | Admitting: Nurse Practitioner

## 2022-10-28 VITALS — BP 121/71 | HR 60 | Temp 97.6°F | Ht 65.98 in | Wt 168.2 lb

## 2022-10-28 DIAGNOSIS — I152 Hypertension secondary to endocrine disorders: Secondary | ICD-10-CM

## 2022-10-28 DIAGNOSIS — K222 Esophageal obstruction: Secondary | ICD-10-CM | POA: Diagnosis not present

## 2022-10-28 DIAGNOSIS — E1140 Type 2 diabetes mellitus with diabetic neuropathy, unspecified: Secondary | ICD-10-CM

## 2022-10-28 DIAGNOSIS — R7989 Other specified abnormal findings of blood chemistry: Secondary | ICD-10-CM | POA: Diagnosis not present

## 2022-10-28 DIAGNOSIS — Z87891 Personal history of nicotine dependence: Secondary | ICD-10-CM

## 2022-10-28 DIAGNOSIS — E1159 Type 2 diabetes mellitus with other circulatory complications: Secondary | ICD-10-CM | POA: Diagnosis not present

## 2022-10-28 DIAGNOSIS — E785 Hyperlipidemia, unspecified: Secondary | ICD-10-CM

## 2022-10-28 DIAGNOSIS — K219 Gastro-esophageal reflux disease without esophagitis: Secondary | ICD-10-CM | POA: Diagnosis not present

## 2022-10-28 DIAGNOSIS — Z0000 Encounter for general adult medical examination without abnormal findings: Secondary | ICD-10-CM | POA: Diagnosis not present

## 2022-10-28 DIAGNOSIS — N5201 Erectile dysfunction due to arterial insufficiency: Secondary | ICD-10-CM

## 2022-10-28 DIAGNOSIS — Z136 Encounter for screening for cardiovascular disorders: Secondary | ICD-10-CM | POA: Diagnosis not present

## 2022-10-28 DIAGNOSIS — E1169 Type 2 diabetes mellitus with other specified complication: Secondary | ICD-10-CM | POA: Diagnosis not present

## 2022-10-28 LAB — BAYER DCA HB A1C WAIVED: HB A1C (BAYER DCA - WAIVED): 6.1 % — ABNORMAL HIGH (ref 4.8–5.6)

## 2022-10-28 NOTE — Assessment & Plan Note (Signed)
Continue collaboration with urology and current treatment as ordered by them.  Recent notes and labs reviewed.

## 2022-10-28 NOTE — Assessment & Plan Note (Addendum)
Quit >23 years ago and has continued to not return to smoking.  Praised for continued cessation. Will perform AAA screening, do not see in chart.

## 2022-10-28 NOTE — Assessment & Plan Note (Signed)
Stable without medication.  Continue good control T2DM and HTN.  Continue collaboration with urology.

## 2022-10-28 NOTE — Progress Notes (Signed)
BP 121/71   Pulse 60   Temp 97.6 F (36.4 C)   Ht 5' 5.98" (1.676 m)   Wt 168 lb 3.2 oz (76.3 kg)   SpO2 96%   BMI 27.16 kg/m    Subjective:    Patient ID: Lawrence Jensen, male    DOB: [birth year], 72 y.o.   MRN: [id number]  HPI: Lawrence Jensen is a 72 y.o. male presenting on 10/28/2022 for comprehensive medical examination. Current medical complaints include: numbness  He currently lives with: wife Interim Problems from his last visit: no  Followed by urology for low testosterone, gets injections, last visit 05/05/22.  DIABETES A1c 5.9% March.  Continues on Metformin twice a day.   Hypoglycemic episodes:no Polydipsia/polyuria: no Visual disturbance: no Chest pain: no Paresthesias: yes Glucose Monitoring: yes  Accucheck frequency: Daily  Fasting glucose: 120-130 ranges  Post prandial:  Evening:  Before meals: Taking Insulin?: no  Long acting insulin:  Short acting insulin: Blood Pressure Monitoring: a few times a week Retinal Examination: Not Up to Date -- [facility] Foot Exam: Up to date Pneumovax: Refuses Influenza: Refuses Aspirin: yes  HYPERTENSION / HYPERLIPIDEMIA Continues on Amlodipine and Lisinopril + Crestor. Satisfied with current treatment? yes Duration of hypertension: chronic BP monitoring frequency: a few times a week BP range: 110-120/70-80 BP medication side effects: no Duration of hyperlipidemia: years Cholesterol medication side effects: no Cholesterol supplements: none Medication compliance: good compliance Aspirin: no Recent stressors: no Recurrent headaches: no Visual changes: no Palpitations: no Dyspnea: no Chest pain: no Lower extremity edema: no Dizzy/lightheaded: no   Functional Status Survey: Is the patient deaf or have difficulty hearing?: No Does the patient have difficulty seeing, even when wearing glasses/contacts?: No Does the patient have difficulty concentrating, remembering, or making decisions?:  No Does the patient have difficulty walking or climbing stairs?: No Does the patient have difficulty dressing or bathing?: No Does the patient have difficulty doing errands alone such as visiting a doctor's office or shopping?: No  FALL RISK:    10/28/2022    8:39 AM 07/09/2022    9:57 AM 02/17/2022   10:41 AM 12/06/2020    1:02 PM 12/04/2019   10:34 AM  Fall Risk   Falls in the past year? 0 0 0 0 0  Number falls in past yr:  0 0    Injury with Fall? 0 0 0    Risk for fall due to : No Fall Risks No Fall Risks  Medication side effect Medication side effect  Follow up Falls evaluation completed Falls evaluation completed Falls evaluation completed;Education provided;Falls prevention discussed Falls evaluation completed;Education provided;Falls prevention discussed Falls evaluation completed;Education provided;Falls prevention discussed    Depression Screen    10/28/2022    8:39 AM 07/09/2022    9:57 AM 04/09/2022    9:17 AM 02/17/2022   10:42 AM 10/08/2021    8:29 AM  Depression screen PHQ 2/9  Decreased Interest 0 0 0 0 1  Down, Depressed, Hopeless 0 0 0 0 0  PHQ - 2 Score 0 0 0 0 1  Altered sleeping 0 0 0 0 0  Tired, decreased energy 3 3 0 0 3  Change in appetite 0 0 0 0 0  Feeling bad or failure about yourself  0 0 0 0 0  Trouble concentrating 0 0 0 0 0  Moving slowly or fidgety/restless 0 0 0 0 0  Suicidal thoughts 0 0 0 0 0  PHQ-9 Score 3 3 0  0 4  Difficult doing work/chores Extremely dIfficult Very difficult Not difficult at all Not difficult at all Extremely dIfficult      10/28/2022    8:39 AM 07/09/2022    9:57 AM 04/09/2022    9:17 AM 10/08/2021    8:29 AM  GAD 7 : Generalized Anxiety Score  Nervous, Anxious, on Edge 0 0 0 0  Control/stop worrying 0 0 0 0  Worry too much - different things 0 0 0 0  Trouble relaxing 0 0 0 0  Restless 3 2 0 1  Easily annoyed or irritable 2 0 0 1  Afraid - awful might happen 0 0 0 0  Total GAD 7 Score 5 2 0 2  Anxiety Difficulty Not  difficult at all Somewhat difficult Not difficult at all Somewhat difficult   Past Medical History:  Past Medical History:  Diagnosis Date   Diabetes mellitus without complication (HCC)    ED (erectile dysfunction)    Hyperlipidemia    Hypertension     Surgical History:  Past Surgical History:  Procedure Laterality Date   APPENDECTOMY  04/13/1997   TOOTH EXTRACTION      Medications:  Current Outpatient Medications on File Prior to Visit  Medication Sig   AMBULATORY NON FORMULARY MEDICATION Trimix (30/1/10)-(Pap/Phent/PGE)  Dosage: Inject 4 cc per injection   Vial 1ml  Qty #10 Refills 6  [facility] [phone] Fax [phone]   amLODipine (NORVASC) 5 MG tablet Take 1 tablet (5 mg total) by mouth daily.   aspirin EC 81 MG tablet Take 81 mg by mouth daily.   Blood Glucose Calibration (OT ULTRA/FASTTK CNTRL SOLN) SOLN    Blood Glucose Monitoring Suppl (ONE TOUCH ULTRA 2) w/Device KIT Inject 1 kit into the skin 3 (three) times daily.   COD LIVER OIL PO Take 3 tablets by mouth daily.   glucose blood (ACCU-CHEK AVIVA PLUS) test strip USE AS INSTRUCTED   lisinopril (ZESTRIL) 20 MG tablet Take 1 tablet (20 mg total) by mouth daily.   Magnesium 400 MG TABS Take 400 mg by mouth 2 (two) times daily.   meloxicam (MOBIC) 15 MG tablet Take 1 tablet (15 mg total) by mouth daily.   metFORMIN (GLUCOPHAGE) 1000 MG tablet Take 1 tablet (1,000 mg total) by mouth 2 (two) times daily with a meal.   NEEDLE, DISP, 18 G (BD DISP NEEDLES) 18G X 1-1/2" MISC 1 mg by Does not apply route once a week.   NEEDLE, DISP, 21 G (BD DISP NEEDLES) 21G X 1-1/2" MISC 1 mg by Does not apply route once a week.   rosuvastatin (CRESTOR) 20 MG tablet Take 1 tablet (20 mg total) by mouth daily.   Syringe, Disposable, (2-3CC SYRINGE) 3 ML MISC 1 mg by Does not apply route once a week.   testosterone cypionate (DEPOTESTOSTERONE CYPIONATE) 200 MG/ML injection Inject 0.5 cc (100 mg) every 7 days   No  current facility-administered medications on file prior to visit.    Allergies:  No Known Allergies  Social History:  Social History   Socioeconomic History   Marital status: Married    Spouse name: Not on file   Number of children: Not on file   Years of education: Not on file   Highest education level: Associate degree: academic program  Occupational History   Occupation: retired  Tobacco Use   Smoking status: Former    Current packs/day: 0.00    Average packs/day: 1 pack/day for 35.0 years (35.0 ttl pk-yrs)  Types: Cigarettes    Start date: 04/02/1960    Quit date: 04/03/1995    Years since quitting: 27.5   Smokeless tobacco: Never  Vaping Use   Vaping status: Never Used  Substance and Sexual Activity   Alcohol use: No   Drug use: No   Sexual activity: Not Currently  Other Topics Concern   Not on file  Social History Narrative   Not on file   Social Determinants of Health   Financial Resource Strain: Low Risk  (02/17/2022)   Overall Financial Resource Strain (CARDIA)    Difficulty of Paying Living Expenses: Not hard at all  Food Insecurity: No Food Insecurity (02/17/2022)   Hunger Vital Sign    Worried About Running Out of Food in the Last Year: Never true    Ran Out of Food in the Last Year: Never true  Transportation Needs: No Transportation Needs (12/06/2020)   PRAPARE - Administrator, Civil Service (Medical): No    Lack of Transportation (Non-Medical): No  Physical Activity: Inactive (02/17/2022)   Exercise Vital Sign    Days of Exercise per Week: 0 days    Minutes of Exercise per Session: 0 min  Stress: No Stress Concern Present (02/17/2022)   [employer] of Occupational Health - Occupational Stress Questionnaire    Feeling of Stress : Not at all  Social Connections: Moderately Isolated (02/17/2022)   Social Connection and Isolation Panel [NHANES]    Frequency of Communication with Friends and Family: More than three times a week     Frequency of Social Gatherings with Friends and Family: Once a week    Attends Religious Services: Never    Database administrator or Organizations: No    Attends Banker Meetings: Never    Marital Status: Married  Catering manager Violence: Not At Risk (02/17/2022)   Humiliation, Afraid, Rape, and Kick questionnaire    Fear of Current or Ex-Partner: No    Emotionally Abused: No    Physically Abused: No    Sexually Abused: No   Social History   Tobacco Use  Smoking Status Former   Current packs/day: 0.00   Average packs/day: 1 pack/day for 35.0 years (35.0 ttl pk-yrs)   Types: Cigarettes   Start date: 04/02/1960   Quit date: 04/03/1995   Years since quitting: 27.5  Smokeless Tobacco Never   Social History   Substance and Sexual Activity  Alcohol Use No    Family History:  Family History  Problem Relation Age of Onset   Heart disease Mother    Heart disease Father    Congestive Heart Failure Father    Cancer Father        prostate   Diabetes Father    Stroke Father    Hypertension Father    Cancer Brother    Stroke Maternal Grandmother    Heart disease Brother    COPD Neg Hx     Past medical history, surgical history, medications, allergies, family history and social history reviewed with patient today and changes made to appropriate areas of the chart.   ROS All other ROS negative except what is listed above and in the HPI.      Objective:    BP 121/71   Pulse 60   Temp 97.6 F (36.4 C)   Ht 5' 5.98" (1.676 m)   Wt 168 lb 3.2 oz (76.3 kg)   SpO2 96%   BMI 27.16 kg/m   Wt Readings from  Last 3 Encounters:  10/28/22 168 lb 3.2 oz (76.3 kg)  07/09/22 173 lb 12.8 oz (78.8 kg)  05/05/22 160 lb (72.6 kg)    Physical Exam Vitals and nursing note reviewed.  Constitutional:      General: He is awake. He is not in acute distress.    Appearance: He is well-developed and well-groomed. He is not ill-appearing or toxic-appearing.  HENT:      Head: Normocephalic and atraumatic.     Right Ear: Hearing, tympanic membrane, ear canal and external ear normal. No drainage.     Left Ear: Hearing, tympanic membrane, ear canal and external ear normal. No drainage.     Nose: Nose normal.     Mouth/Throat:     Pharynx: Uvula midline.  Eyes:     General: Lids are normal.        Right eye: No discharge.        Left eye: No discharge.     Extraocular Movements: Extraocular movements intact.     Conjunctiva/sclera: Conjunctivae normal.     Pupils: Pupils are equal, round, and reactive to light.     Visual Fields: Right eye visual fields normal and left eye visual fields normal.  Neck:     Thyroid: No thyromegaly.     Vascular: No carotid bruit or JVD.     Trachea: Trachea normal.  Cardiovascular:     Rate and Rhythm: Normal rate and regular rhythm.     Heart sounds: Normal heart sounds, S1 normal and S2 normal. No murmur heard.    No gallop.  Pulmonary:     Effort: Pulmonary effort is normal. No accessory muscle usage or respiratory distress.     Breath sounds: Normal breath sounds.  Abdominal:     General: Bowel sounds are normal.     Palpations: Abdomen is soft. There is no hepatomegaly or splenomegaly.     Tenderness: There is no abdominal tenderness.  Musculoskeletal:        General: Normal range of motion.     Cervical back: Normal range of motion and neck supple.     Right lower leg: No edema.     Left lower leg: No edema.  Lymphadenopathy:     Head:     Right side of head: No submental, submandibular, tonsillar, preauricular or posterior auricular adenopathy.     Left side of head: No submental, submandibular, tonsillar, preauricular or posterior auricular adenopathy.     Cervical: No cervical adenopathy.  Skin:    General: Skin is warm and dry.     Capillary Refill: Capillary refill takes less than 2 seconds.     Findings: No rash.  Neurological:     Mental Status: He is alert and oriented to person, place, and time.      Gait: Gait is intact.     Deep Tendon Reflexes: Reflexes are normal and symmetric.     Reflex Scores:      Brachioradialis reflexes are 2+ on the right side and 2+ on the left side.      Patellar reflexes are 2+ on the right side and 2+ on the left side. Psychiatric:        Attention and Perception: Attention normal.        Mood and Affect: Mood normal.        Speech: Speech normal.        Behavior: Behavior normal. Behavior is cooperative.        Thought Content: Thought content  normal.        Cognition and Memory: Cognition normal.    Diabetic Foot Exam - Simple   Simple Foot Form Visual Inspection See comments: Yes Sensation Testing Intact to touch and monofilament testing bilaterally: Yes Pulse Check Posterior Tibialis and Dorsalis pulse intact bilaterally: Yes Comments Dry areas on bilateral feet.        02/17/2022   10:38 AM 12/06/2020    1:04 PM 10/03/2020    9:24 AM 12/04/2019   10:36 AM 09/06/2019    8:19 AM  6CIT Screen  What Year? 0 points 0 points 0 points 0 points 0 points  What month? 0 points 0 points 0 points 0 points 0 points  What time? 0 points 0 points 0 points 0 points 0 points  Count back from 20 0 points 0 points 0 points 0 points 0 points  Months in reverse 0 points 0 points 0 points 0 points 0 points  Repeat phrase 2 points 4 points 0 points 0 points 0 points  Total Score 2 points 4 points 0 points 0 points 0 points   Results for orders placed or performed in visit on 10/26/22  PSA  Result Value Ref Range   Prostate Specific Ag, Serum 0.4 0.0 - 4.0 ng/mL  Hemoglobin and Hematocrit, Blood  Result Value Ref Range   Hemoglobin 15.3 13.0 - 17.7 g/dL   Hematocrit 40.9 81.1 - 51.0 %      Assessment & Plan:   Problem List Items Addressed This Visit       Cardiovascular and Mediastinum   Erectile dysfunction due to arterial insufficiency    Stable without medication.  Continue good control T2DM and HTN.  Continue collaboration with  urology.      Hypertension associated with diabetes (HCC)    Chronic, stable.  BP well below goal in office and at home. Continue Lisinopril 20 MG and Amlodipine 5 MG daily. Recommend continue to use Meloxicam sparingly.  Monitor BP at home daily and focus on DASH diet.  Labs today: CBC, CMP, TSH.  Urine ALB 11 July 2022.   Return in 6 months.      Relevant Orders   Bayer DCA Hb A1c Waived   CBC with Differential/Platelet   Comprehensive metabolic panel   TSH     Digestive   GERD with stricture   Relevant Orders   Magnesium     Endocrine   Hyperlipidemia associated with type 2 diabetes mellitus (HCC)    Chronic, ongoing.  Continue current medication regimen and adjust as needed.  Lipid panel today.      Relevant Orders   Bayer DCA Hb A1c Waived   Comprehensive metabolic panel   Lipid Panel w/o Chol/HDL Ratio   Type 2 diabetes mellitus with diabetic neuropathy, without long-term current use of insulin (HCC) - Primary    Chronic, ongoing.  A1c 6.1% today, slight upward trend from 5.9% -- often is in 6 range and stable.  Urine ALB 11 July 2022, will continue Lisinopril for kidney protection.  Continue current medication regimen and adjust as needed.  Recommend continued focus on daily exercise, which he has started again, and healthy diet.  Return in 6 months for T2DM.  Could consider addition of Farxiga or Jardiance if elevations in future. - Refuses all vaccinations, have discussed at all visits. - Foot exam done, recommend he schedule eye exam. - ACE and Statin on board.      Relevant Orders   Bayer DCA Hb  A1c Waived     Other   History of smoking    Quit >23 years ago and has continued to not return to smoking.  Praised for continued cessation. Will perform AAA screening, do not see in chart.      Relevant Orders   [facility] MEDICARE SCREENING   Low testosterone in male    Continue collaboration with urology and current treatment as ordered by them.  Recent notes and  labs reviewed.      Other Visit Diagnoses     Screening for AAA (abdominal aortic aneurysm)       Past smoker, quit >23 years ago, see no history of this screening in chart.   Relevant Orders   [facility] [employer] MEDIC[facility] SCREENING   Encounter for annual physical exam       Annual physical today with labs and health maintenance reviewed, discussed with patient.       Discussed aspirin prophylaxis for myocardial infarction prevention and decision was made to continue ASA  LABORATORY TESTING:  Health maintenance labs ordered today as discussed above.   The natural history of prostate cancer and ongoing controversy regarding screening and potential treatment outcomes of prostate cancer has been discussed with the patient. The meaning of a false positive PSA and a false negative PSA has been discussed. He indicates understanding of the limitations of this screening test and wishes to proceed with screening PSA testing.   IMMUNIZATIONS:   - Tdap: Tetanus vaccination status reviewed: last tetanus booster within 10 years. - Influenza: Refused - Pneumovax: Refused - Prevnar: Refused - Zostavax vaccine: Refused  SCREENING: - Colonoscopy: Cologuard due 02/19/2025 Discussed with patient purpose of the colonoscopy is to detect colon cancer at curable precancerous or early stages   - AAA Screening: Ordered Today -Hearing Test: Not applicable  -Spirometry: Not applicable   PATIENT COUNSELING:    Sexuality: Discussed sexually transmitted diseases, partner selection, use of condoms, avoidance of unintended pregnancy  and contraceptive alternatives.   Advised to avoid cigarette smoking.  I discussed with the patient that most people either abstain from alcohol or drink within safe limits (<=14/week and <=4 drinks/occasion for males, <=7/weeks and <= 3 drinks/occasion for females) and that the risk for alcohol disorders and other health effects rises proportionally with the number of drinks per week  and how often a drinker exceeds daily limits.  Discussed cessation/primary prevention of drug use and availability of treatment for abuse.   Diet: Encouraged to adjust caloric intake to maintain  or achieve ideal body weight, to reduce intake of dietary saturated fat and total fat, to limit sodium intake by avoiding high sodium foods and not adding table salt, and to maintain adequate dietary potassium and calcium preferably from fresh fruits, vegetables, and low-fat dairy products.    Stressed the importance of regular exercise  Injury prevention: Discussed safety belts, safety helmets, smoke detector, smoking near bedding or upholstery.   Dental health: Discussed importance of regular tooth brushing, flossing, and dental visits.   Follow up plan: NEXT PREVENTATIVE PHYSICAL DUE IN 1 YEAR. Return in about 6 months (around 04/30/2023) for T2DM, HTN/HLD.

## 2022-10-28 NOTE — Assessment & Plan Note (Signed)
Chronic, ongoing.  Continue current medication regimen and adjust as needed. Lipid panel today. 

## 2022-10-28 NOTE — Assessment & Plan Note (Signed)
Chronic, stable.  BP well below goal in office and at home. Continue Lisinopril 20 MG and Amlodipine 5 MG daily. Recommend continue to use Meloxicam sparingly.  Monitor BP at home daily and focus on DASH diet.  Labs today: CBC, CMP, TSH.  Urine ALB 11 July 2022.   Return in 6 months.

## 2022-10-28 NOTE — Assessment & Plan Note (Signed)
Chronic, ongoing.  A1c 6.1% today, slight upward trend from 5.9% -- often is in 6 range and stable.  Urine ALB 11 July 2022, will continue Lisinopril for kidney protection.  Continue current medication regimen and adjust as needed.  Recommend continued focus on daily exercise, which he has started again, and healthy diet.  Return in 6 months for T2DM.  Could consider addition of Farxiga or Jardiance if elevations in future. - Refuses all vaccinations, have discussed at all visits. - Foot exam done, recommend he schedule eye exam. - ACE and Statin on board.

## 2022-10-29 LAB — CBC WITH DIFFERENTIAL/PLATELET
Basophils Absolute: 0 10*3/uL (ref 0.0–0.2)
Basos: 1 %
EOS (ABSOLUTE): 0.2 10*3/uL (ref 0.0–0.4)
Eos: 3 %
Hematocrit: 45.8 % (ref 37.5–51.0)
Hemoglobin: 15.8 g/dL (ref 13.0–17.7)
Immature Grans (Abs): 0 10*3/uL (ref 0.0–0.1)
Immature Granulocytes: 0 %
Lymphocytes Absolute: 1.1 10*3/uL (ref 0.7–3.1)
Lymphs: 23 %
MCH: 32 pg (ref 26.6–33.0)
MCHC: 34.5 g/dL (ref 31.5–35.7)
MCV: 93 fL (ref 79–97)
Monocytes Absolute: 0.4 10*3/uL (ref 0.1–0.9)
Monocytes: 8 %
Neutrophils Absolute: 3.2 10*3/uL (ref 1.4–7.0)
Neutrophils: 65 %
Platelets: 206 10*3/uL (ref 150–450)
RBC: 4.94 x10E6/uL (ref 4.14–5.80)
RDW: 12.5 % (ref 11.6–15.4)
WBC: 4.9 10*3/uL (ref 3.4–10.8)

## 2022-10-29 LAB — LIPID PANEL W/O CHOL/HDL RATIO
Cholesterol, Total: 98 mg/dL — ABNORMAL LOW (ref 100–199)
HDL: 40 mg/dL (ref >39)
LDL Chol Calc (NIH): 40 mg/dL (ref 0–99)
Triglycerides: 93 mg/dL (ref 0–149)
VLDL Cholesterol Cal: 18 mg/dL (ref 5–40)

## 2022-10-29 LAB — TSH: TSH: 2.57 u[IU]/mL (ref 0.450–4.500)

## 2022-10-29 LAB — COMPREHENSIVE METABOLIC PANEL
ALT: 13 IU/L (ref 0–44)
AST: 18 IU/L (ref 0–40)
Albumin: 4.6 g/dL (ref 3.8–4.8)
Alkaline Phosphatase: 79 IU/L (ref 44–121)
BUN/Creatinine Ratio: 15 (ref 10–24)
BUN: 17 mg/dL (ref 8–27)
Bilirubin Total: 0.5 mg/dL (ref 0.0–1.2)
CO2: 27 mmol/L (ref 20–29)
Calcium: 9.7 mg/dL (ref 8.6–10.2)
Chloride: 99 mmol/L (ref 96–106)
Creatinine, Ser: 1.14 mg/dL (ref 0.76–1.27)
Globulin, Total: 2.3 g/dL (ref 1.5–4.5)
Glucose: 124 mg/dL — ABNORMAL HIGH (ref 70–99)
Potassium: 4.6 mmol/L (ref 3.5–5.2)
Sodium: 138 mmol/L (ref 134–144)
Total Protein: 6.9 g/dL (ref 6.0–8.5)
eGFR: 69 mL/min/{1.73_m2} (ref >59)

## 2022-10-29 LAB — MAGNESIUM: Magnesium: 2 mg/dL (ref 1.6–2.3)

## 2022-10-29 NOTE — Progress Notes (Signed)
Good morning, please let Lawrence Jensen know his labs have returned and everything continues to look fabulous -- including kidney function and cholesterol levels.  Continue all current medications.  Great job!! Keep being amazing!!  Thank you for allowing me to participate in your care.  I appreciate you. Kindest regards, Favio Moder

## 2022-11-03 ENCOUNTER — Ambulatory Visit
Admission: RE | Admit: 2022-11-03 | Discharge: 2022-11-03 | Disposition: A | Discharge status: Home / Self Care | Payer: Medicare Other | Source: Ambulatory Visit | Attending: Nurse Practitioner | Admitting: Nurse Practitioner

## 2022-11-03 DIAGNOSIS — Z87891 Personal history of nicotine dependence: Secondary | ICD-10-CM | POA: Insufficient documentation

## 2022-11-03 DIAGNOSIS — Z136 Encounter for screening for cardiovascular disorders: Secondary | ICD-10-CM | POA: Diagnosis not present

## 2022-11-03 NOTE — Progress Notes (Signed)
Please let Lawrence Jensen know his screening returned and he has no aortic aneurysm.  Great news!!

## 2022-11-04 NOTE — Progress Notes (Unsigned)
10/30/2021 10:59 AM   Lawrence Jensen [birth year] [id number]  Referring provider: Marjie Skiff, NP [address] [place],  [place] [address]  Urological history: 1.  Testosterone deficiency -Contributing factors of age and diabetes -Testosterone level pending  -Hemoglobin/hematocrit (10/2022)- 15.8/45.8 -Testosterone cypionate 0.5 cc every 7 days   2. ED -Contributing factors of age, testosterone deficiency, diabetes, hypertension, hyperlipidemia and history of smoking -failed PDE5i's, ICI and VED  3. BPH with LU TS -PSA (10/2022) 0.4  4. Family history of prostate cancer -Father underwent a prostatectomy at age 35 for prostate cancer   Chief Complaint  Patient presents with   Follow-up    HPI: Lawrence Jensen is a 72 y.o. male who presents today for six month follow up.  Previous records reviewed.  I PSS ***    IPSS     Row Name 05/05/22 1000         International Prostate Symptom Score   How often have you had the sensation of not emptying your bladder? Not at All     How often have you had to urinate less than every two hours? Less than half the time     How often have you found you stopped and started again several times when you urinated? Not at All     How often have you found it difficult to postpone urination? More than half the time     How often have you had a weak urinary stream? Less than 1 in 5 times     How often have you had to strain to start urination? Not at All     How many times did you typically get up at night to urinate? 1 Time     Total IPSS Score 8       Quality of Life due to urinary symptoms   If you were to spend the rest of your life with your urinary condition just the way it is now how would you feel about that? Mostly Satisfied               Score:  1-7 Mild 8-19 Moderate 20-35 Severe  SHIM ***    Score: 1-7 Severe ED 8-11 Moderate ED 12-16 Mild-Moderate ED 17-21 Mild ED 22-25 No ED     PMH: Past Medical History:  Diagnosis Date   Diabetes mellitus without complication (HCC)    ED (erectile dysfunction)    Hyperlipidemia    Hypertension     Surgical History: Past Surgical History:  Procedure Laterality Date   APPENDECTOMY  04/13/1997   TOOTH EXTRACTION      Home Medications:  Allergies as of 05/05/2022   No Known Allergies      Medication List        Accurate as of May 05, 2022 10:59 AM. If you have any questions, ask your nurse or doctor.          STOP taking these medications    albuterol 108 (90 Base) MCG/ACT inhaler Commonly known as: VENTOLIN HFA Stopped by: Ruthella Kirchman, PA-C       TAKE these medications    2-3CC SYRINGE 3 ML Misc 1 mg by Does not apply route once a week.   Accu-Chek Aviva Plus test strip Generic drug: glucose blood USE AS INSTRUCTED   AMBULATORY NON FORMULARY MEDICATION Trimix (30/1/10)-(Pap/Phent/PGE)  Dosage: Inject 4 cc per injection   Vial 1ml  Qty #10 Refills 6  [facility] [phone] Fax [phone]  Started by: [name], PA-C   amLODipine 5 MG tablet Commonly known as: NORVASC Take 1 tablet (5 mg total) by mouth daily.   aspirin EC 81 MG tablet Take 81 mg by mouth daily.   BD Disp Needles 18G X 1-1/2" Misc Generic drug: NEEDLE (DISP) 18 G 1 mg by Does not apply route once a week.   BD Disp Needles 21G X 1-1/2" Misc Generic drug: NEEDLE (DISP) 21 G 1 mg by Does not apply route once a week.   COD LIVER OIL PO Take 3 tablets by mouth daily.   lisinopril 20 MG tablet Commonly known as: ZESTRIL Take 1 tablet (20 mg total) by mouth daily.   Magnesium 400 MG Tabs Take 400 mg by mouth 2 (two) times daily.   meloxicam 15 MG tablet Commonly known as: MOBIC Take 1 tablet (15 mg total) by mouth daily.   metFORMIN 1000 MG tablet Commonly known as: GLUCOPHAGE Take 1 tablet (1,000 mg total) by mouth 2 (two) times daily with a meal.   ONE TOUCH ULTRA 2  w/Device Kit Inject 1 kit into the skin 3 (three) times daily.   OT ULTRA/FASTTK CNTRL SOLN Soln   rosuvastatin 20 MG tablet Commonly known as: CRESTOR Take 1 tablet (20 mg total) by mouth daily.   testosterone cypionate 200 MG/ML injection Commonly known as: DEPOTESTOSTERONE CYPIONATE Inject 0.5 cc (100 mg) every 7 days        Allergies: No Known Allergies  Family History: Family History  Problem Relation Age of Onset   Heart disease Mother    Heart disease Father    Congestive Heart Failure Father    Cancer Father        prostate   Diabetes Father    Stroke Father    Hypertension Father    Cancer Brother    Stroke Maternal Grandmother    Heart disease Brother    COPD Neg Hx     Social History:  reports that he quit smoking about 27 years ago. His smoking use included cigarettes. He has a 35.00 pack-year smoking history. He has never used smokeless tobacco. He reports that he does not drink alcohol and does not use drugs.  ROS: Pertinent ROS in HPI  Physical Exam: BP 119/69   Pulse 61   Ht 5\' 6"  (1.676 m)   Wt 160 lb (72.6 kg)   BMI 25.82 kg/m   Constitutional:  Well nourished. Alert and oriented, No acute distress. HEENT: [place] AT, moist mucus membranes.  Trachea midline Cardiovascular: No clubbing, cyanosis, or edema. Respiratory: Normal respiratory effort, no increased work of breathing. GU: No CVA tenderness.  No bladder fullness or masses.  Patient with circumcised/uncircumcised phallus. ***Foreskin easily retracted***  Urethral meatus is patent.  No penile discharge. No penile lesions or rashes. Scrotum without lesions, cysts, rashes and/or edema.  Testicles are located scrotally bilaterally. No masses are appreciated in the testicles. Left and right epididymis are normal. Rectal: Patient with  normal sphincter tone. Anus and perineum without scarring or rashes. No rectal masses are appreciated. Prostate is approximately *** grams, *** nodules are appreciated.  Seminal vesicles are normal. Neurologic: Grossly intact, no focal deficits, moving all 4 extremities. Psychiatric: Normal mood and affect.   Laboratory Data: Results for orders placed or performed in visit on 10/28/22  Bayer DCA Hb A1c Waived  Result Value Ref Range   HB A1C (BAYER DCA - WAIVED) 6.1 (H) 4.8 - 5.6 %  CBC with Differential/Platelet  Result  Value Ref Range   WBC 4.9 3.4 - 10.8 x10E3/uL   RBC 4.94 4.14 - 5.80 x10E6/uL   Hemoglobin 15.8 13.0 - 17.7 g/dL   Hematocrit 16.1 09.6 - 51.0 %   MCV 93 79 - 97 fL   MCH 32.0 26.6 - 33.0 pg   MCHC 34.5 31.5 - 35.7 g/dL   RDW 04.5 40.9 - 81.1 %   Platelets 206 150 - 450 x10E3/uL   Neutrophils 65 Not Estab. %   Lymphs 23 Not Estab. %   Monocytes 8 Not Estab. %   Eos 3 Not Estab. %   Basos 1 Not Estab. %   Neutrophils Absolute 3.2 1.4 - 7.0 x10E3/uL   Lymphocytes Absolute 1.1 0.7 - 3.1 x10E3/uL   Monocytes Absolute 0.4 0.1 - 0.9 x10E3/uL   EOS (ABSOLUTE) 0.2 0.0 - 0.4 x10E3/uL   Basophils Absolute 0.0 0.0 - 0.2 x10E3/uL   Immature Granulocytes 0 Not Estab. %   Immature Grans (Abs) 0.0 0.0 - 0.1 x10E3/uL  Comprehensive metabolic panel  Result Value Ref Range   Glucose 124 (H) 70 - 99 mg/dL   BUN 17 8 - 27 mg/dL   Creatinine, Ser 9.14 0.76 - 1.27 mg/dL   eGFR 69 >78 GN/FAO/1.30   BUN/Creatinine Ratio 15 10 - 24   Sodium 138 134 - 144 mmol/L   Potassium 4.6 3.5 - 5.2 mmol/L   Chloride 99 96 - 106 mmol/L   CO2 27 20 - 29 mmol/L   Calcium 9.7 8.6 - 10.2 mg/dL   Total Protein 6.9 6.0 - 8.5 g/dL   Albumin 4.6 3.8 - 4.8 g/dL   Globulin, Total 2.3 1.5 - 4.5 g/dL   Bilirubin Total 0.5 0.0 - 1.2 mg/dL   Alkaline Phosphatase 79 44 - 121 IU/L   AST 18 0 - 40 IU/L   ALT 13 0 - 44 IU/L  Lipid Panel w/o Chol/HDL Ratio  Result Value Ref Range   Cholesterol, Total 98 (L) 100 - 199 mg/dL   Triglycerides 93 0 - 149 mg/dL   HDL 40 >86 mg/dL   VLDL Cholesterol Cal 18 5 - 40 mg/dL   LDL Chol Calc (NIH) 40 0 - 99 mg/dL  TSH  Result  Value Ref Range   TSH 2.570 0.450 - 4.500 uIU/mL  Magnesium  Result Value Ref Range   Magnesium 2.0 1.6 - 2.3 mg/dL   Component     Latest Ref Rng 10/26/2022  Prostate Specific Ag, Serum     0.0 - 4.0 ng/mL 0.4   I have reviewed the labs.   Pertinent Imaging: N/A  Assessment & Plan:    1. Testosterone deficiency  -testosterone levels pending -H & H WNL -increase Testosterone cypionate to 0.5 cc (100 mg) every 10 days   2. BPH with LUTS -PSA stable -continue conservative management, avoiding bladder irritants and timed voiding's  3. Erectile dysfunction:    - failed PDE5i's, VED and ICI - neuropathy is abating, so he would like another prescription sent for Trimix   Return in about 3 months (around 08/04/2022) for testosterone (one week after injection) [facility] & Hazel Sams  [facility] [address]  [address] [place], [place] [address] [phone]

## 2022-11-05 ENCOUNTER — Encounter: Payer: Self-pay | Admitting: Urology

## 2022-11-05 ENCOUNTER — Ambulatory Visit: Payer: Medicare Other | Admitting: Urology

## 2022-11-05 VITALS — BP 116/63 | HR 51 | Wt 160.0 lb

## 2022-11-05 DIAGNOSIS — E349 Endocrine disorder, unspecified: Secondary | ICD-10-CM

## 2022-11-05 DIAGNOSIS — N138 Other obstructive and reflux uropathy: Secondary | ICD-10-CM | POA: Diagnosis not present

## 2022-11-05 DIAGNOSIS — N529 Male erectile dysfunction, unspecified: Secondary | ICD-10-CM | POA: Diagnosis not present

## 2022-11-05 DIAGNOSIS — N401 Enlarged prostate with lower urinary tract symptoms: Secondary | ICD-10-CM | POA: Diagnosis not present

## 2022-11-05 DIAGNOSIS — E291 Testicular hypofunction: Secondary | ICD-10-CM | POA: Diagnosis not present

## 2022-11-05 MED ORDER — TESTOSTERONE CYPIONATE 200 MG/ML IM SOLN
INTRAMUSCULAR | 0 refills | Status: DC
Start: 2022-11-05 — End: 2023-05-27

## 2023-02-17 ENCOUNTER — Telehealth: Payer: Self-pay | Admitting: Nurse Practitioner

## 2023-02-17 NOTE — Telephone Encounter (Signed)
Copied from CRM [id number]. Topic: Medicare AWV >> Feb 17, 2023  2:52 PM Payton Doughty wrote: Reason for CRM: Called LVM 02/17/2023 to schedule Annual Wellness Visit  Verlee Rossetti; Care Guide Ambulatory Clinical Support [place] [facility] Direct Dial: [phone]

## 2023-03-09 ENCOUNTER — Ambulatory Visit: Payer: Medicare Other | Admitting: Emergency Medicine

## 2023-03-09 VITALS — Ht 66.0 in | Wt 165.0 lb

## 2023-03-09 DIAGNOSIS — Z7984 Long term (current) use of oral hypoglycemic drugs: Secondary | ICD-10-CM | POA: Diagnosis not present

## 2023-03-09 DIAGNOSIS — E119 Type 2 diabetes mellitus without complications: Secondary | ICD-10-CM

## 2023-03-09 DIAGNOSIS — Z0000 Encounter for general adult medical examination without abnormal findings: Secondary | ICD-10-CM | POA: Diagnosis not present

## 2023-03-09 DIAGNOSIS — E1140 Type 2 diabetes mellitus with diabetic neuropathy, unspecified: Secondary | ICD-10-CM

## 2023-03-09 NOTE — Progress Notes (Signed)
Subjective:   Lawrence Jensen is a 72 y.o. male who presents for Medicare Annual/Subsequent preventive examination.  Visit Complete: Virtual I connected with  [name] on 03/09/23 by a audio enabled telemedicine application and verified that I am speaking with the correct person using two identifiers.  Patient Location: Home  Provider Location: Office/Clinic  I discussed the limitations of evaluation and management by telemedicine. The patient expressed understanding and agreed to proceed.  Vital Signs: Because this visit was a virtual/telehealth visit, some criteria may be missing or patient reported. Any vitals not documented were not able to be obtained and vitals that have been documented are patient reported.   Cardiac Risk Factors include: advanced age (>35men, >96 women);male gender;hypertension;diabetes mellitus;dyslipidemia     Objective:    Today's Vitals   03/09/23 1113  Weight: 165 lb (74.8 kg)  Height: 5\' 6"  (1.676 m)   Body mass index is 26.63 kg/m.     03/09/2023   11:27 AM 12/06/2020    1:02 PM 12/04/2019   10:33 AM 11/28/2018    4:09 PM 02/04/2018    4:43 PM 11/17/2017    3:55 PM 02/02/2017    8:23 AM  Advanced Directives  Does Patient Have a Medical Advance Directive? No No No Yes No No No  Would patient like information on creating a medical advance directive? Yes (MAU/Ambulatory/Procedural Areas - Information given)    No - Patient declined Yes (MAU/Ambulatory/Procedural Areas - Information given)     Current Medications (verified) Outpatient Encounter Medications as of 03/09/2023  Medication Sig   amLODipine (NORVASC) 5 MG tablet Take 1 tablet (5 mg total) by mouth daily.   aspirin EC 81 MG tablet Take 81 mg by mouth daily.   Blood Glucose Calibration (OT ULTRA/FASTTK CNTRL SOLN) SOLN    Blood Glucose Monitoring Suppl (ONE TOUCH ULTRA 2) w/Device KIT Inject 1 kit into the skin 3 (three) times daily.   COD LIVER OIL PO Take 3 tablets  by mouth daily.   glucose blood (ACCU-CHEK AVIVA PLUS) test strip USE AS INSTRUCTED   lisinopril (ZESTRIL) 20 MG tablet Take 1 tablet (20 mg total) by mouth daily.   Magnesium 400 MG TABS Take 400 mg by mouth 2 (two) times daily.   meloxicam (MOBIC) 15 MG tablet Take 1 tablet (15 mg total) by mouth daily.   metFORMIN (GLUCOPHAGE) 1000 MG tablet Take 1 tablet (1,000 mg total) by mouth 2 (two) times daily with a meal.   rosuvastatin (CRESTOR) 20 MG tablet Take 1 tablet (20 mg total) by mouth daily.   testosterone cypionate (DEPOTESTOSTERONE CYPIONATE) 200 MG/ML injection Inject 0.5 cc (100 mg) every 7 days   AMBULATORY NON FORMULARY MEDICATION Trimix (30/1/10)-(Pap/Phent/PGE)  Dosage: Inject 4 cc per injection   Vial 1ml  Qty #10 Refills 6  [facility] [phone] Fax [phone] (Patient not taking: Reported on 03/09/2023)   NEEDLE, DISP, 18 G (BD DISP NEEDLES) 18G X 1-1/2" MISC 1 mg by Does not apply route once a week.   NEEDLE, DISP, 21 G (BD DISP NEEDLES) 21G X 1-1/2" MISC 1 mg by Does not apply route once a week.   Syringe, Disposable, (2-3CC SYRINGE) 3 ML MISC 1 mg by Does not apply route once a week.   No facility-administered encounter medications on file as of 03/09/2023.    Allergies (verified) Patient has no known allergies.   History: Past Medical History:  Diagnosis Date   Diabetes mellitus without complication (HCC)  ED (erectile dysfunction)    Hyperlipidemia    Hypertension    Past Surgical History:  Procedure Laterality Date   APPENDECTOMY  04/13/1997   TOOTH EXTRACTION     Family History  Problem Relation Age of Onset   Heart disease Mother    Heart disease Father    Congestive Heart Failure Father    Cancer Father        prostate   Diabetes Father    Stroke Father    Hypertension Father    Cancer Brother    Stroke Maternal Grandmother    Heart disease Brother    COPD Neg Hx    Social History   Socioeconomic History   Marital  status: Married    Spouse name: Vikki Ports   Number of children: 4   Years of education: Not on file   Highest education level: Associate degree: academic program  Occupational History   Occupation: retired  Tobacco Use   Smoking status: Former    Current packs/day: 0.00    Average packs/day: 1 pack/day for 35.0 years (35.0 ttl pk-yrs)    Types: Cigarettes    Start date: 04/02/1960    Quit date: 04/03/1995    Years since quitting: 27.9   Smokeless tobacco: Never  Vaping Use   Vaping status: Never Used  Substance and Sexual Activity   Alcohol use: Yes    Alcohol/week: 4.0 - 5.0 standard drinks of alcohol    Types: 4 - 5 Cans of beer per week    Comment: 1 beer 4-5 times per week   Drug use: No   Sexual activity: Not Currently  Other Topics Concern   Not on file  Social History Narrative   Not on file   Social Determinants of Health   Financial Resource Strain: Low Risk  (03/09/2023)   Overall Financial Resource Strain (CARDIA)    Difficulty of Paying Living Expenses: Not hard at all  Food Insecurity: No Food Insecurity (03/09/2023)   Hunger Vital Sign    Worried About Running Out of Food in the Last Year: Never true    Ran Out of Food in the Last Year: Never true  Transportation Needs: No Transportation Needs (03/09/2023)   PRAPARE - Administrator, Civil Service (Medical): No    Lack of Transportation (Non-Medical): No  Physical Activity: Inactive (03/09/2023)   Exercise Vital Sign    Days of Exercise per Week: 0 days    Minutes of Exercise per Session: 0 min  Stress: No Stress Concern Present (03/09/2023)   [employer] of Occupational Health - Occupational Stress Questionnaire    Feeling of Stress : Not at all  Social Connections: Moderately Isolated (03/09/2023)   Social Connection and Isolation Panel [NHANES]    Frequency of Communication with Friends and Family: More than three times a week    Frequency of Social Gatherings with Friends and  Family: More than three times a week    Attends Religious Services: Never    Database administrator or Organizations: No    Attends Engineer, structural: Never    Marital Status: Married    Tobacco Counseling Counseling given: Not Answered   Clinical Intake:  Pre-visit preparation completed: Yes  Pain : No/denies pain     BMI - recorded: 26.63 Nutritional Status: BMI 25 -29 Overweight Nutritional Risks: None Diabetes: Yes CBG done?: No (FBS 132 per patient) Did pt. bring in CBG monitor from home?: No  How often do  you need to have someone help you when you read instructions, pamphlets, or other written materials from your doctor or pharmacy?: 1 - Never  Interpreter Needed?: No  Information entered by :: Tora Kindred, CMA   Activities of Daily Living    03/09/2023   11:17 AM 10/28/2022    9:24 AM  In your present state of health, do you have any difficulty performing the following activities:  Hearing? 0 0  Vision? 0 0  Difficulty concentrating or making decisions? 0 0  Walking or climbing stairs? 1 0  Comment sometimes due to weak legs   Dressing or bathing? 0 0  Doing errands, shopping? 0 0  Preparing Food and eating ? N   Using the Toilet? N   In the past six months, have you accidently leaked urine? N   Do you have problems with loss of bowel control? N   Managing your Medications? N   Managing your Finances? N   Housekeeping or managing your Housekeeping? N     Patient Care Team: Marjie Skiff, NP as PCP - General (Nurse Practitioner)  Indicate any recent Medical Services you may have received from other than [facility] providers in the past year (date may be approximate).     Assessment:   This is a routine wellness examination for Jaryn.  Hearing/Vision screen Hearing Screening - Comments:: Denies hearing loss Vision Screening - Comments:: Needs eye exam   Goals Addressed               This Visit's Progress     Exercise 3x per  week (30 min per time) (pt-stated)        Get back to exercising      Depression Screen    03/09/2023   11:24 AM 10/28/2022    8:39 AM 07/09/2022    9:57 AM 04/09/2022    9:17 AM 02/17/2022   10:42 AM 10/08/2021    8:29 AM 07/03/2021    8:08 AM  PHQ 2/9 Scores  PHQ - 2 Score 0 0 0 0 0 1 0  PHQ- 9 Score 3 3 3  0 0 4 3    Fall Risk    03/09/2023   11:28 AM 10/28/2022    8:39 AM 07/09/2022    9:57 AM 02/17/2022   10:41 AM 12/06/2020    1:02 PM  Fall Risk   Falls in the past year? 1 0 0 0 0  Number falls in past yr: 1  0 0   Injury with Fall? 0 0 0 0   Risk for fall due to : History of fall(s);Impaired balance/gait;Impaired mobility;Orthopedic patient No Fall Risks No Fall Risks  Medication side effect  Follow up Falls prevention discussed;Education provided;Falls evaluation completed Falls evaluation completed Falls evaluation completed Falls evaluation completed;Education provided;Falls prevention discussed Falls evaluation completed;Education provided;Falls prevention discussed    MEDICARE RISK AT HOME: Medicare Risk at Home Any stairs in or around the home?: Yes If so, are there any without handrails?: No Home free of loose throw rugs in walkways, pet beds, electrical cords, etc?: Yes Adequate lighting in your home to reduce risk of falls?: Yes Life alert?: No Use of a cane, walker or w/c?: No Grab bars in the bathroom?: No Shower chair or bench in shower?: No Elevated toilet seat or a handicapped toilet?: No  TIMED UP AND GO:  Was the test performed?  No    Cognitive Function:        03/09/2023  11:30 AM 02/17/2022   10:38 AM 12/06/2020    1:04 PM 10/03/2020    9:24 AM 12/04/2019   10:36 AM  6CIT Screen  What Year? 0 points 0 points 0 points 0 points 0 points  What month? 0 points 0 points 0 points 0 points 0 points  What time? 0 points 0 points 0 points 0 points 0 points  Count back from 20 0 points 0 points 0 points 0 points 0 points  Months in reverse 0 points  0 points 0 points 0 points 0 points  Repeat phrase 0 points 2 points 4 points 0 points 0 points  Total Score 0 points 2 points 4 points 0 points 0 points    Immunizations Immunization History  Administered Date(s) Administered   Tdap 01/02/2014    TDAP status: Up to date  Flu Vaccine status: Declined, Education has been provided regarding the importance of this vaccine but patient still declined. Advised may receive this vaccine at local pharmacy or Health Dept. Aware to provide a copy of the vaccination record if obtained from local pharmacy or Health Dept. Verbalized acceptance and understanding.  Pneumococcal vaccine status: Declined,  Education has been provided regarding the importance of this vaccine but patient still declined. Advised may receive this vaccine at local pharmacy or Health Dept. Aware to provide a copy of the vaccination record if obtained from local pharmacy or Health Dept. Verbalized acceptance and understanding.   Covid-19 vaccine status: Declined, Education has been provided regarding the importance of this vaccine but patient still declined. Advised may receive this vaccine at local pharmacy or Health Dept.or vaccine clinic. Aware to provide a copy of the vaccination record if obtained from local pharmacy or Health Dept. Verbalized acceptance and understanding.  Qualifies for Shingles Vaccine? Yes   Zostavax completed No   Shingrix Completed?: No.    Education has been provided regarding the importance of this vaccine. Patient has been advised to call insurance company to determine out of pocket expense if they have not yet received this vaccine. Advised may also receive vaccine at local pharmacy or Health Dept. Verbalized acceptance and understanding. Patient declined  Screening Tests Health Maintenance  Topic Date Due   Zoster Vaccines- Shingrix (1 of 2) Never done   OPHTHALMOLOGY EXAM  09/10/2022   INFLUENZA VACCINE  Never done   Pneumonia Vaccine 65+ Years  old (1 of 1 - PCV) 04/10/2023 (Originally 07/21/2016)   HEMOGLOBIN A1C  04/30/2023   Diabetic kidney evaluation - Urine ACR  07/09/2023   Diabetic kidney evaluation - eGFR measurement  10/28/2023   FOOT EXAM  10/28/2023   DTaP/Tdap/Td (2 - Td or Tdap) 01/03/2024   Medicare Annual Wellness (AWV)  03/08/2024   Fecal DNA (Cologuard)  02/19/2025   Hepatitis C Screening  Completed   HPV VACCINES  Aged Out   COVID-19 Vaccine  Discontinued    Health Maintenance  Health Maintenance Due  Topic Date Due   Zoster Vaccines- Shingrix (1 of 2) Never done   OPHTHALMOLOGY EXAM  09/10/2022   INFLUENZA VACCINE  Never done    Colorectal cancer screening: Type of screening: Cologuard. Completed 02/19/22. Repeat every 3 years  Lung Cancer Screening: (Low Dose CT Chest recommended if Age 22-80 years, 20 pack-year currently smoking OR have quit w/in 15years.) does not qualify.   Lung Cancer Screening Referral: n/a  Additional Screening:  Hepatitis C Screening: does not qualify; Completed 08/24/18  Vision Screening: Recommended annual ophthalmology exams for early detection of  glaucoma and other disorders of the eye. Is the patient up to date with their annual eye exam?  No  Who is the provider or what is the name of the office in which the patient attends annual eye exams? [facility] If pt is not established with a provider, would they like to be referred to a provider to establish care? No .   Dental Screening: Recommended annual dental exams for proper oral hygiene  Diabetic Foot Exam: Diabetic Foot Exam: Completed 10/28/22  Community Resource Referral / Chronic Care Management: CRR required this visit?  No   CCM required this visit?  No     Plan:     I have personally reviewed and noted the following in the patient's chart:   Medical and social history Use of alcohol, tobacco or illicit drugs  Current medications and supplements including opioid prescriptions. Patient is not  currently taking opioid prescriptions. Functional ability and status Nutritional status Physical activity Advanced directives List of other physicians Hospitalizations, surgeries, and ER visits in previous 12 months Vitals Screenings to include cognitive, depression, and falls Referrals and appointments  In addition, I have reviewed and discussed with patient certain preventive protocols, quality metrics, and best practice recommendations. A written personalized care plan for preventive services as well as general preventive health recommendations were provided to patient.     Tora Kindred, CMA   03/09/2023   After Visit Summary: (Mail) Due to this being a telephonic visit, the after visit summary with patients personalized plan was offered to patient via mail   Nurse Notes:  Placed referral to DM & Nutrition education Advised patient he needs DM eye exam Declined flu, pneumonia, covid and shingles vaccines

## 2023-03-09 NOTE — Patient Instructions (Addendum)
Mr. Franchina , Thank you for taking time to come for your Medicare Wellness Visit. I appreciate your ongoing commitment to your health goals. Please review the following plan we discussed and let me know if I can assist you in the future.   Referrals/Orders/Follow-Ups/Clinician Recommendations:   Call [facility] @ [phone] to schedule a diabetic eye exam as soon as possible. We recommend you get an eye exam every year. I have placed a referral to diabetes & nutrition education. Someone will call you to schedule this appointment.  This is a list of the screening recommended for you and due dates:  Health Maintenance  Topic Date Due   Zoster (Shingles) Vaccine (1 of 2) Never done   Eye exam for diabetics  09/10/2022   Pneumonia Vaccine (1 of 1 - PCV) 04/10/2023*   Flu Shot  07/12/2023*   Hemoglobin A1C  04/30/2023   Yearly kidney health urinalysis for diabetes  07/09/2023   Yearly kidney function blood test for diabetes  10/28/2023   Complete foot exam   10/28/2023   DTaP/Tdap/Td vaccine (2 - Td or Tdap) 01/03/2024   Medicare Annual Wellness Visit  03/08/2024   Cologuard (Stool DNA test)  02/19/2025   Hepatitis C Screening  Completed   HPV Vaccine  Aged Out   COVID-19 Vaccine  Discontinued  *Topic was postponed. The date shown is not the original due date.    Advanced directives: (Provided) Advance directive discussed with you today. I have provided a copy for you to complete at home and have notarized. Once this is complete, please bring a copy in to our office so we can scan it into your chart.   Next Medicare Annual Wellness Visit scheduled for next year: Yes, 03/14/24 @ 12:30pm  Fall Prevention in the Home, Adult Falls can cause injuries and affect people of all ages. There are many simple things that you can do to make your home safe and to help prevent falls. If you need it, ask for help making these changes. What actions can I take to prevent falls? General  information Use good lighting in all rooms. Make sure to: Replace any light bulbs that burn out. Turn on lights if it is dark and use night-lights. Keep items that you use often in easy-to-reach places. Lower the shelves around your home if needed. Move furniture so that there are clear paths around it. Do not keep throw rugs or other things on the floor that can make you trip. If any of your floors are uneven, fix them. Add color or contrast paint or tape to clearly mark and help you see: Grab bars or handrails. First and last steps of staircases. Where the edge of each step is. If you use a ladder or stepladder: Make sure that it is fully opened. Do not climb a closed ladder. Make sure the sides of the ladder are locked in place. Have someone hold the ladder while you use it. Know where your pets are as you move through your home. What can I do in the bathroom?     Keep the floor dry. Clean up any water that is on the floor right away. Remove soap buildup in the bathtub or shower. Buildup makes bathtubs and showers slippery. Use non-skid mats or decals on the floor of the bathtub or shower. Attach bath mats securely with double-sided, non-slip rug tape. If you need to sit down while you are in the shower, use a non-slip stool. Install grab bars by  the toilet and in the bathtub and shower. Do not use towel bars as grab bars. What can I do in the bedroom? Make sure that you have a light by your bed that is easy to reach. Do not use any sheets or blankets on your bed that hang to the floor. Have a firm bench or chair with side arms that you can use for support when you get dressed. What can I do in the kitchen? Clean up any spills right away. If you need to reach something above you, use a sturdy step stool that has a grab bar. Keep electrical cables out of the way. Do not use floor polish or wax that makes floors slippery. What can I do with my stairs? Do not leave anything on  the stairs. Make sure that you have a light switch at the top and the bottom of the stairs. Have them installed if you do not have them. Make sure that there are handrails on both sides of the stairs. Fix handrails that are broken or loose. Make sure that handrails are as long as the staircases. Install non-slip stair treads on all stairs in your home if they do not have carpet. Avoid having throw rugs at the top or bottom of stairs, or secure the rugs with carpet tape to prevent them from moving. Choose a carpet design that does not hide the edge of steps on the stairs. Make sure that carpet is firmly attached to the stairs. Fix any carpet that is loose or worn. What can I do on the outside of my home? Use bright outdoor lighting. Repair the edges of walkways and driveways and fix any cracks. Clear paths of anything that can make you trip, such as tools or rocks. Add color or contrast paint or tape to clearly mark and help you see high doorway thresholds. Trim any bushes or trees on the main path into your home. Check that handrails are securely fastened and in good repair. Both sides of all steps should have handrails. Install guardrails along the edges of any raised decks or porches. Have leaves, snow, and ice cleared regularly. Use sand, salt, or ice melt on walkways during winter months if you live where there is ice and snow. In the garage, clean up any spills right away, including grease or oil spills. What other actions can I take? Review your medicines with your health care provider. Some medicines can make you confused or feel dizzy. This can increase your chance of falling. Wear closed-toe shoes that fit well and support your feet. Wear shoes that have rubber soles and low heels. Use a cane, walker, scooter, or crutches that help you move around if needed. Talk with your provider about other ways that you can decrease your risk of falls. This may include seeing a physical therapist to  learn to do exercises to improve movement and strength. Where to find more information Centers for Disease Control and Prevention, STEADI: TonerPromos.no [employer] on Aging: BaseRingTones.pl National Institute on Aging: BaseRingTones.pl Contact a health care provider if: You are afraid of falling at home. You feel weak, drowsy, or dizzy at home. You fall at home. Get help right away if you: Lose consciousness or have trouble moving after a fall. Have a fall that causes a head injury. These symptoms may be an emergency. Get help right away. Call 911. Do not wait to see if the symptoms will go away. Do not drive yourself to the hospital. This information  is not intended to replace advice given to you by your health care provider. Make sure you discuss any questions you have with your health care provider. Document Revised: 12/01/2021 Document Reviewed: 12/01/2021 Elsevier Patient Education  2024 [employer].

## 2023-03-15 DIAGNOSIS — E119 Type 2 diabetes mellitus without complications: Secondary | ICD-10-CM | POA: Diagnosis not present

## 2023-03-15 DIAGNOSIS — H2513 Age-related nuclear cataract, bilateral: Secondary | ICD-10-CM | POA: Diagnosis not present

## 2023-03-15 DIAGNOSIS — H43813 Vitreous degeneration, bilateral: Secondary | ICD-10-CM | POA: Diagnosis not present

## 2023-03-15 LAB — HM DIABETES EYE EXAM

## 2023-04-24 DIAGNOSIS — Z2821 Immunization not carried out because of patient refusal: Secondary | ICD-10-CM | POA: Insufficient documentation

## 2023-04-24 NOTE — Patient Instructions (Signed)
Be Involved in Caring For Your Health:  Taking Medications When medications are taken as directed, they can greatly improve your health. But if they are not taken as prescribed, they may not work. In some cases, not taking them correctly can be harmful. To help ensure your treatment remains effective and safe, understand your medications and how to take them. Bring your medications to each visit for review by your provider.  Your lab results, notes, and after visit summary will be available on My Chart. We strongly encourage you to use this feature. If lab results are abnormal the clinic will contact you with the appropriate steps. If the clinic does not contact you assume the results are satisfactory. You can always view your results on My Chart. If you have questions regarding your health or results, please contact the clinic during office hours. You can also ask questions on My Chart.  We at [facility] are grateful that you chose [place] to provide your care. We strive to provide evidence-based and compassionate care and are always looking for feedback. If you get a survey from the clinic please complete this so we can hear your opinions.  Diabetes Mellitus and Foot Care Diabetes, also called diabetes mellitus, may cause problems with your feet and legs because of poor blood flow (circulation). Poor circulation may make your skin: Become thinner and drier. Break more easily. Heal more slowly. Peel and crack. You may also have nerve damage (neuropathy). This can cause decreased feeling in your legs and feet. This means that you may not notice minor injuries to your feet that could lead to more serious problems. Finding and treating problems early is the best way to prevent future foot problems. How to care for your feet Foot hygiene  Wash your feet daily with warm water and mild soap. Do not use hot water. Then, pat your feet and the areas between your toes until they are fully dry. Do  not soak your feet. This can dry your skin. Trim your toenails straight across. Do not dig under them or around the cuticle. File the edges of your nails with an emery board or nail file. Apply a moisturizing lotion or petroleum jelly to the skin on your feet and to dry, brittle toenails. Use lotion that does not contain alcohol and is unscented. Do not apply lotion between your toes. Shoes and socks Wear clean socks or stockings every day. Make sure they are not too tight. Do not wear knee-high stockings. These may decrease blood flow to your legs. Wear shoes that fit well and have enough cushioning. Always look in your shoes before you put them on to be sure there are no objects inside. To break in new shoes, wear them for just a few hours a day. This prevents injuries on your feet. Wounds, scrapes, corns, and calluses  Check your feet daily for blisters, cuts, bruises, sores, and redness. If you cannot see the bottom of your feet, use a mirror or ask someone for help. Do not cut off corns or calluses or try to remove them with medicine. If you find a minor scrape, cut, or break in the skin on your feet, keep it and the skin around it clean and dry. You may clean these areas with mild soap and water. Do not clean the area with peroxide, alcohol, or iodine. If you have a wound, scrape, corn, or callus on your foot, look at it several times a day to make sure it  is healing and not infected. Check for: Redness, swelling, or pain. Fluid or blood. Warmth. Pus or a bad smell. General tips Do not cross your legs. This may decrease blood flow to your feet. Do not use heating pads or hot water bottles on your feet. They may burn your skin. If you have lost feeling in your feet or legs, you may not know this is happening until it is too late. Protect your feet from hot and cold by wearing shoes, such as at the beach or on hot pavement. Schedule a complete foot exam at least once a year or more often if  you have foot problems. Report any cuts, sores, or bruises to your health care provider right away. Where to find more information American Diabetes Association: diabetes.org Association of Diabetes Care & Education Specialists: diabeteseducator.org Contact a health care provider if: You have a condition that increases your risk of infection, and you have any cuts, sores, or bruises on your feet. You have an injury that is not healing. You have redness on your legs or feet. You feel burning or tingling in your legs or feet. You have pain or cramps in your legs and feet. Your legs or feet are numb. Your feet always feel cold. You have pain around any toenails. Get help right away if: You have a wound, scrape, corn, or callus on your foot and: You have signs of infection. You have a fever. You have a red line going up your leg. This information is not intended to replace advice given to you by your health care provider. Make sure you discuss any questions you have with your health care provider. Document Revised: 10/01/2021 Document Reviewed: 10/01/2021 Elsevier Patient Education  2024 [employer].

## 2023-04-30 ENCOUNTER — Encounter: Payer: Self-pay | Admitting: Nurse Practitioner

## 2023-04-30 ENCOUNTER — Ambulatory Visit (INDEPENDENT_AMBULATORY_CARE_PROVIDER_SITE_OTHER): Payer: Medicare Other | Admitting: Nurse Practitioner

## 2023-04-30 VITALS — BP 126/67 | HR 58 | Temp 98.7°F | Ht 66.0 in | Wt 174.4 lb

## 2023-04-30 DIAGNOSIS — E119 Type 2 diabetes mellitus without complications: Secondary | ICD-10-CM | POA: Insufficient documentation

## 2023-04-30 DIAGNOSIS — E1159 Type 2 diabetes mellitus with other circulatory complications: Secondary | ICD-10-CM | POA: Diagnosis not present

## 2023-04-30 DIAGNOSIS — E1169 Type 2 diabetes mellitus with other specified complication: Secondary | ICD-10-CM | POA: Diagnosis not present

## 2023-04-30 DIAGNOSIS — Z2821 Immunization not carried out because of patient refusal: Secondary | ICD-10-CM

## 2023-04-30 DIAGNOSIS — I152 Hypertension secondary to endocrine disorders: Secondary | ICD-10-CM

## 2023-04-30 DIAGNOSIS — E1140 Type 2 diabetes mellitus with diabetic neuropathy, unspecified: Secondary | ICD-10-CM

## 2023-04-30 DIAGNOSIS — E785 Hyperlipidemia, unspecified: Secondary | ICD-10-CM

## 2023-04-30 DIAGNOSIS — L989 Disorder of the skin and subcutaneous tissue, unspecified: Secondary | ICD-10-CM | POA: Insufficient documentation

## 2023-04-30 DIAGNOSIS — R7989 Other specified abnormal findings of blood chemistry: Secondary | ICD-10-CM

## 2023-04-30 DIAGNOSIS — Z7984 Long term (current) use of oral hypoglycemic drugs: Secondary | ICD-10-CM

## 2023-04-30 LAB — MICROALBUMIN, URINE WAIVED
Creatinine, Urine Waived: 200 mg/dL (ref 10–300)
Microalb, Ur Waived: 150 mg/L — ABNORMAL HIGH (ref 0–19)

## 2023-04-30 LAB — BAYER DCA HB A1C WAIVED: HB A1C (BAYER DCA - WAIVED): 6.5 % — ABNORMAL HIGH (ref 4.8–5.6)

## 2023-04-30 MED ORDER — ROSUVASTATIN CALCIUM 20 MG PO TABS: 20.0000 mg | ORAL_TABLET | Freq: Every day | ORAL | 4 refills | Status: AC

## 2023-04-30 MED ORDER — METFORMIN HCL 1000 MG PO TABS: 1000.0000 mg | ORAL_TABLET | Freq: Two times a day (BID) | ORAL | 4 refills | Status: AC

## 2023-04-30 MED ORDER — LISINOPRIL 20 MG PO TABS: 20.0000 mg | ORAL_TABLET | Freq: Every day | ORAL | 4 refills | Status: AC

## 2023-04-30 MED ORDER — AMLODIPINE BESYLATE 5 MG PO TABS: 5.0000 mg | ORAL_TABLET | Freq: Every day | ORAL | 4 refills | Status: AC

## 2023-04-30 NOTE — Assessment & Plan Note (Addendum)
Chronic, stable.  BP well at goal in office and at home. Continue Lisinopril 20 MG and Amlodipine 5 MG daily. Recommend continue to use Meloxicam sparingly.  Monitor BP at home daily and focus on DASH diet.  Labs today: CMP.  Urine ALB 150 January 2025.   Return in 6 months.

## 2023-04-30 NOTE — Assessment & Plan Note (Addendum)
Chronic, ongoing.  A1c 6.5% today and stable. Often is in 6 range. Urine ALB 150 January 2025, will continue Lisinopril for kidney protection.  Continue current medication regimen and adjust as needed.  Recommend continued focus on daily exercise, which he has started again, and healthy diet.  Return in 6 months.  Could consider addition of Farxiga or Jardiance if elevations in future. - Refuses all vaccinations, have discussed multiple times with patient. - Foot and eye exams up to date. - ACE and Statin on board.

## 2023-04-30 NOTE — Progress Notes (Addendum)
BP 126/67   Pulse (!) 58   Temp 98.7 F (37.1 C) (Oral)   Ht 5\' 6"  (1.676 m)   Wt 174 lb 6.4 oz (79.1 kg)   SpO2 97%   BMI 28.15 kg/m    Subjective:    Patient ID: Lawrence Jensen, male    DOB: 04-05-1951, 73 y.o.   MRN: [id number]  HPI: Lawrence Jensen is a 73 y.o. male  Chief Complaint  Patient presents with   Diabetes   Hyperlipidemia   Hypertension   DIABETES A1c 6.1% in July.  Takes Metformin 1000 MG two times a day.  Over holidays did indulge a little bit more. Hypoglycemic episodes:no Polydipsia/polyuria: no Visual disturbance: no Chest pain: no Paresthesias: no Glucose Monitoring: yes             Accucheck frequency: Daily, morning             Fasting glucose: 111 to 173 -- average <130             Post prandial:             Evening:             Before meals: Taking Insulin?: no             Long acting insulin:             Short acting insulin: Blood Pressure Monitoring: daily Retinal Examination: Up to Date -- [facility]  Foot Exam: Up to Date Pneumovax: refused Influenza: refused Aspirin: yes    HYPERTENSION / HYPERLIPIDEMIA Taking Lisinopril 20 MG, Amlodipine 5 MG + Crestor 20 MG daily.  Satisfied with current treatment? yes Duration of hypertension: chronic BP monitoring frequency: every morning BP range: 110-130/60-70 range at home BP medication side effects: no Duration of hyperlipidemia: chronic Cholesterol medication side effects: no Cholesterol supplements: fish oil Medication compliance: good compliance Aspirin: yes Recent stressors: no Recurrent headaches: no Visual changes: no Palpitations: no Dyspnea: no Chest pain: no Lower extremity edema: no Dizzy/lightheaded: no   SKIN LESION To lateral right nose. Duration: months Location: right lateral upper nose Painful: no Itching: no Onset: gradual Context: changing Associated signs and symptoms: get itchy History of skin cancer: no History of precancerous  skin lesions: no Family history of skin cancer: no      04/30/2023    8:14 AM 03/09/2023   11:24 AM 10/28/2022    8:39 AM 07/09/2022    9:57 AM 04/09/2022    9:17 AM  Depression screen PHQ 2/9  Decreased Interest 0 0 0 0 0  Down, Depressed, Hopeless 0 0 0 0 0  PHQ - 2 Score 0 0 0 0 0  Altered sleeping 0 0 0 0 0  Tired, decreased energy 3 3 3 3  0  Change in appetite 0 0 0 0 0  Feeling bad or failure about yourself  0 0 0 0 0  Trouble concentrating 0 0 0 0 0  Moving slowly or fidgety/restless 0 0 0 0 0  Suicidal thoughts 0 0 0 0 0  PHQ-9 Score 3 3 3 3  0  Difficult doing work/chores Very difficult Somewhat difficult Extremely dIfficult Very difficult Not difficult at all       04/30/2023    8:13 AM 04/30/2023    8:12 AM 10/28/2022    8:39 AM 07/09/2022    9:57 AM  GAD 7 : Generalized Anxiety Score  Nervous, Anxious, on Edge 0 0 0 0  Control/stop worrying 0 0 0 0  Worry too much - different things 0 0 0 0  Trouble relaxing 0 0 0 0  Restless 0 0 3 2  Easily annoyed or irritable 0 0 2 0  Afraid - awful might happen 0 0 0 0  Total GAD 7 Score 0 0 5 2  Anxiety Difficulty Not difficult at all Not difficult at all Not difficult at all Somewhat difficult   Relevant past medical, surgical, family and social history reviewed and updated as indicated. Interim medical history since our last visit reviewed. Allergies and medications reviewed and updated.  Review of Systems  Constitutional:  Negative for activity change, diaphoresis, fatigue and fever.  Respiratory:  Negative for cough, chest tightness, shortness of breath and wheezing.   Cardiovascular:  Negative for chest pain, palpitations and leg swelling.  Gastrointestinal: Negative.   Endocrine: Negative for cold intolerance, heat intolerance, polydipsia, polyphagia and polyuria.  Neurological: Negative.   Psychiatric/Behavioral: Negative.      Per HPI unless specifically indicated above     Objective:    BP 126/67   Pulse  (!) 58   Temp 98.7 F (37.1 C) (Oral)   Ht 5\' 6"  (1.676 m)   Wt 174 lb 6.4 oz (79.1 kg)   SpO2 97%   BMI 28.15 kg/m   Wt Readings from Last 3 Encounters:  04/30/23 174 lb 6.4 oz (79.1 kg)  03/09/23 165 lb (74.8 kg)  11/05/22 160 lb (72.6 kg)    Physical Exam Vitals and nursing note reviewed.  Constitutional:      General: He is awake. He is not in acute distress.    Appearance: He is well-developed. He is not ill-appearing.  HENT:     Head: Normocephalic and atraumatic.     Right Ear: Hearing normal. No drainage.     Left Ear: Hearing normal. No drainage.  Eyes:     General: Lids are normal.        Right eye: No discharge.        Left eye: No discharge.     Conjunctiva/sclera: Conjunctivae normal.     Pupils: Pupils are equal, round, and reactive to light.  Neck:     Thyroid: No thyromegaly.     Vascular: No carotid bruit.  Cardiovascular:     Rate and Rhythm: Regular rhythm. Bradycardia present.     Heart sounds: Normal heart sounds, S1 normal and S2 normal. No murmur heard.    No gallop.  Pulmonary:     Effort: Pulmonary effort is normal. No accessory muscle usage or respiratory distress.     Breath sounds: Normal breath sounds.  Abdominal:     General: Bowel sounds are normal.     Palpations: Abdomen is soft.  Musculoskeletal:        General: Normal range of motion.     Cervical back: Normal range of motion and neck supple.     Right lower leg: No edema.     Left lower leg: No edema.  Skin:    General: Skin is warm and dry.     Capillary Refill: Capillary refill takes less than 2 seconds.     Findings: Lesion present.       Neurological:     Mental Status: He is alert and oriented to person, place, and time.     Deep Tendon Reflexes: Reflexes are normal and symmetric.  Psychiatric:        Attention and Perception: Attention normal.  Mood and Affect: Mood normal.        Behavior: Behavior normal. Behavior is cooperative.        Thought Content:  Thought content normal.     Media Information    Results for orders placed or performed in visit on 03/25/23  [facility] DIABETES EYE EXAM   Collection Time: 03/15/23  8:32 AM  Result Value Ref Range   [facility] Diabetic Eye Exam No Retinopathy No Retinopathy      Assessment & Plan:   Problem List Items Addressed This Visit       Cardiovascular and Mediastinum   Hypertension associated with diabetes (HCC)   Chronic, stable.  BP well at goal in office and at home. Continue Lisinopril 20 MG and Amlodipine 5 MG daily. Recommend continue to use Meloxicam sparingly.  Monitor BP at home daily and focus on DASH diet.  Labs today: CMP.  Urine ALB 150 January 2025.   Return in 6 months.      Relevant Medications   lisinopril (ZESTRIL) 20 MG tablet   amLODipine (NORVASC) 5 MG tablet   metFORMIN (GLUCOPHAGE) 1000 MG tablet   rosuvastatin (CRESTOR) 20 MG tablet   Other Relevant Orders   Bayer DCA Hb A1c Waived   Microalbumin, Urine Waived     Endocrine   Diabetes mellitus treated with oral medication (HCC)   Refer to diabetes with neuropathy plan of care.      Relevant Medications   lisinopril (ZESTRIL) 20 MG tablet   metFORMIN (GLUCOPHAGE) 1000 MG tablet   rosuvastatin (CRESTOR) 20 MG tablet   Hyperlipidemia associated with type 2 diabetes mellitus (HCC)   Chronic, ongoing.  Continue current medication regimen and adjust as needed.  Lipid panel today.      Relevant Medications   lisinopril (ZESTRIL) 20 MG tablet   amLODipine (NORVASC) 5 MG tablet   metFORMIN (GLUCOPHAGE) 1000 MG tablet   rosuvastatin (CRESTOR) 20 MG tablet   Other Relevant Orders   Bayer DCA Hb A1c Waived   Comprehensive metabolic panel   Lipid Panel w/o Chol/HDL Ratio   Type 2 diabetes mellitus with diabetic neuropathy, without long-term current use of insulin (HCC) - Primary   Chronic, ongoing.  A1c 6.5% today and stable. Often is in 6 range. Urine ALB 150 January 2025, will continue Lisinopril for kidney protection.   Continue current medication regimen and adjust as needed.  Recommend continued focus on daily exercise, which he has started again, and healthy diet.  Return in 6 months.  Could consider addition of Farxiga or Jardiance if elevations in future. - Refuses all vaccinations, have discussed multiple times with patient. - Foot and eye exams up to date. - ACE and Statin on board.      Relevant Medications   lisinopril (ZESTRIL) 20 MG tablet   metFORMIN (GLUCOPHAGE) 1000 MG tablet   rosuvastatin (CRESTOR) 20 MG tablet   Other Relevant Orders   Bayer DCA Hb A1c Waived   Microalbumin, Urine Waived     Musculoskeletal and Integument   Lesion of skin of nose   Suspect basal cell carcinoma based on appearance.  Will get into dermatology for removal as it close to eye, upper right exterior nose.  Referral to dermatology.      Relevant Orders   Ambulatory referral to Dermatology     Follow up plan: Return in about 6 months (around 10/28/2023) for Annual Physical after 10/28/23.

## 2023-04-30 NOTE — Addendum Note (Signed)
Addended by: Aura Dials T on: 04/30/2023 08:56 AM   Modules accepted: Orders

## 2023-04-30 NOTE — Assessment & Plan Note (Signed)
Suspect basal cell carcinoma based on appearance.  Will get into dermatology for removal as it close to eye, upper right exterior nose.  Referral to dermatology.

## 2023-04-30 NOTE — Assessment & Plan Note (Signed)
Refer to diabetes with neuropathy plan of care. 

## 2023-04-30 NOTE — Assessment & Plan Note (Signed)
Chronic, ongoing.  Continue current medication regimen and adjust as needed. Lipid panel today. 

## 2023-05-01 LAB — COMPREHENSIVE METABOLIC PANEL
ALT: 10 [IU]/L (ref 0–44)
AST: 18 [IU]/L (ref 0–40)
Albumin: 4.4 g/dL (ref 3.8–4.8)
Alkaline Phosphatase: 86 [IU]/L (ref 44–121)
BUN/Creatinine Ratio: 14 (ref 10–24)
BUN: 16 mg/dL (ref 8–27)
Bilirubin Total: 0.4 mg/dL (ref 0.0–1.2)
CO2: 26 mmol/L (ref 20–29)
Calcium: 10 mg/dL (ref 8.6–10.2)
Chloride: 102 mmol/L (ref 96–106)
Creatinine, Ser: 1.14 mg/dL (ref 0.76–1.27)
Globulin, Total: 2.2 g/dL (ref 1.5–4.5)
Glucose: 146 mg/dL — ABNORMAL HIGH (ref 70–99)
Potassium: 4.3 mmol/L (ref 3.5–5.2)
Sodium: 142 mmol/L (ref 134–144)
Total Protein: 6.6 g/dL (ref 6.0–8.5)
eGFR: 69 mL/min/{1.73_m2} (ref >59)

## 2023-05-01 LAB — LIPID PANEL W/O CHOL/HDL RATIO
Cholesterol, Total: 112 mg/dL (ref 100–199)
HDL: 35 mg/dL — ABNORMAL LOW (ref >39)
LDL Chol Calc (NIH): 56 mg/dL (ref 0–99)
Triglycerides: 112 mg/dL (ref 0–149)
VLDL Cholesterol Cal: 21 mg/dL (ref 5–40)

## 2023-05-01 NOTE — Progress Notes (Signed)
Good morning please let Avant know his labs have returned and overall remain stable.  No medication changes needed.  Great job.  Any questions? Keep being amazing!!  Thank you for allowing me to participate in your care.  I appreciate you. Kindest regards, [name]

## 2023-05-07 ENCOUNTER — Other Ambulatory Visit: Payer: Self-pay | Admitting: *Deleted

## 2023-05-07 DIAGNOSIS — E291 Testicular hypofunction: Secondary | ICD-10-CM

## 2023-05-10 ENCOUNTER — Other Ambulatory Visit: Payer: Medicare Other

## 2023-05-10 DIAGNOSIS — E291 Testicular hypofunction: Secondary | ICD-10-CM

## 2023-05-11 LAB — TESTOSTERONE: Testosterone: 577 ng/dL (ref 264–916)

## 2023-05-11 LAB — HEMOGLOBIN AND HEMATOCRIT, BLOOD
Hematocrit: 46.5 % (ref 37.5–51.0)
Hemoglobin: 16.1 g/dL (ref 13.0–17.7)

## 2023-05-12 ENCOUNTER — Other Ambulatory Visit: Payer: Self-pay | Admitting: *Deleted

## 2023-05-12 DIAGNOSIS — N138 Other obstructive and reflux uropathy: Secondary | ICD-10-CM

## 2023-05-12 DIAGNOSIS — E291 Testicular hypofunction: Secondary | ICD-10-CM

## 2023-05-12 DIAGNOSIS — N529 Male erectile dysfunction, unspecified: Secondary | ICD-10-CM

## 2023-05-12 DIAGNOSIS — E349 Endocrine disorder, unspecified: Secondary | ICD-10-CM

## 2023-05-27 ENCOUNTER — Other Ambulatory Visit: Payer: Self-pay | Admitting: Nurse Practitioner

## 2023-05-27 ENCOUNTER — Other Ambulatory Visit: Payer: Self-pay | Admitting: Urology

## 2023-05-27 DIAGNOSIS — E349 Endocrine disorder, unspecified: Secondary | ICD-10-CM

## 2023-05-28 NOTE — Telephone Encounter (Signed)
Requested Prescriptions  Pending Prescriptions Disp Refills   meloxicam (MOBIC) 15 MG tablet [Pharmacy Med Name: Meloxicam 15 MG Oral Tablet] 90 tablet 1    Sig: Take 1 tablet by mouth once daily     Analgesics:  COX2 Inhibitors Failed - 05/28/2023 10:05 AM      Failed - Manual Review: Labs are only required if the patient has taken medication for more than 8 weeks.      Passed - HGB in normal range and within 360 days    Hemoglobin  Date Value Ref Range Status  05/10/2023 16.1 13.0 - 17.7 g/dL Final         Passed - Cr in normal range and within 360 days    Creatinine, Ser  Date Value Ref Range Status  04/30/2023 1.14 0.76 - 1.27 mg/dL Final         Passed - HCT in normal range and within 360 days    Hematocrit  Date Value Ref Range Status  05/10/2023 46.5 37.5 - 51.0 % Final         Passed - AST in normal range and within 360 days    AST  Date Value Ref Range Status  04/30/2023 18 0 - 40 IU/L Final   AST (SGOT) Piccolo, Waived  Date Value Ref Range Status  03/01/2018 26 11 - 38 U/L Final         Passed - ALT in normal range and within 360 days    ALT  Date Value Ref Range Status  04/30/2023 10 0 - 44 IU/L Final   ALT (SGPT) Piccolo, Waived  Date Value Ref Range Status  03/01/2018 27 10 - 47 U/L Final         Passed - eGFR is 30 or above and within 360 days    GFR calc Af Amer  Date Value Ref Range Status  03/28/2020 72 >59 mL/min/1.73 Final    Comment:    **In accordance with recommendations from the NKF-ASN Task force,**   [facility] is in the process of updating its eGFR calculation to the   2021 CKD-EPI creatinine equation that estimates kidney function   without a race variable.    GFR calc non Af Amer  Date Value Ref Range Status  03/28/2020 62 >59 mL/min/1.73 Final   eGFR  Date Value Ref Range Status  04/30/2023 69 >59 mL/min/1.73 Final         Passed - Patient is not pregnant      Passed - Valid encounter within last 12 months    Recent  Outpatient Visits           4 weeks ago Type 2 diabetes mellitus with diabetic neuropathy, without long-term current use of insulin (HCC)   [facility] [place], [place] T, NP   7 months ago Type 2 diabetes mellitus with diabetic neuropathy, without long-term current use of insulin (HCC)   [facility] [place], [place] T, NP   10 months ago Type 2 diabetes mellitus with diabetic neuropathy, without long-term current use of insulin (HCC)   [facility] [place], [place] T, NP   1 year ago Type 2 diabetes mellitus with diabetic neuropathy, without long-term current use of insulin (HCC)   [facility] [place], [place] T, NP   1 year ago Type 2 diabetes mellitus with diabetic neuropathy, without long-term current use of insulin (HCC)   [place] [facility] [place], [place]  T, NP       Future Appointments             In 2 months McG[place], [place], PA-C [facility] [place]   In 6 months Worthing[place], Dorie Rank, NP [facility], [place]

## 2023-06-01 ENCOUNTER — Encounter: Payer: Medicare Other | Attending: Nurse Practitioner | Admitting: Dietician

## 2023-06-01 ENCOUNTER — Encounter: Payer: Self-pay | Admitting: Dietician

## 2023-06-01 DIAGNOSIS — E1140 Type 2 diabetes mellitus with diabetic neuropathy, unspecified: Secondary | ICD-10-CM | POA: Insufficient documentation

## 2023-06-01 DIAGNOSIS — Z713 Dietary counseling and surveillance: Secondary | ICD-10-CM | POA: Insufficient documentation

## 2023-06-01 DIAGNOSIS — Z7984 Long term (current) use of oral hypoglycemic drugs: Secondary | ICD-10-CM | POA: Diagnosis not present

## 2023-06-01 DIAGNOSIS — E119 Type 2 diabetes mellitus without complications: Secondary | ICD-10-CM | POA: Diagnosis present

## 2023-06-01 NOTE — Progress Notes (Signed)
 Diabetes Self-Management Education  Visit Type: First/Initial  Appt. Start Time: 0810 Appt. End Time: 0920  06/01/2023  [name], identified by name and date of birth, is a 73 y.o. male with a diagnosis of Diabetes: Type 2.   ASSESSMENT Pt reports long history of DM, had A1c well controlled until recent elevation. Pt reports eating poorly over the holidays and stress from having to work longer shifts at work since November. Pt reports starting a daily B-complex MV at the start of the new year. Pt reports FBG values usually run in 130's. 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 Pt reports taking metformin for the last ~8 years, dose increased to 1,000 mg BID this past year, no side effects reported, has not had B12 levels checked. Pt reports history of resistance training and running when they were younger, states they do not have the energy usually anymore, states they may exercise with some weights for 15-20 minutes a few times a month. Pt reports being on testosterone replacement therapy for the last year, has seen some improvement in energy levels but still feels low energy most days. Pt reports this lowers their motivation to walk and be active. Pt reports following a low carb diet, choosing lean proteins, eats consistently during the day. Pt reports quitting sodas years ago, drinks a glass of water in the morning every day and drinks multiple cups of coffee during the day.    Diabetes Self-Management Education - 06/01/23 0821       Visit Information   Visit Type First/Initial      Initial Visit   Diabetes Type Type 2    Date Diagnosed 2015    Are you currently following a meal plan? Yes    What type of meal plan do you follow? Low Carb    Are you taking your medications as prescribed? Yes      Health Coping   How would you rate your overall health? Good      Psychosocial  Assessment   Patient Belief/Attitude about Diabetes Motivated to manage diabetes    What is the hardest part about your diabetes right now, causing you the most concern, or is the most worrisome to you about your diabetes?   Making healty food and beverage choices;Getting support / problem solving    Self-care barriers None    Self-management support Doctor's office;Family    Other persons present Patient    Patient Concerns Nutrition/Meal planning;Glycemic Control;Healthy Lifestyle    Special Needs None    Preferred Learning Style No preference indicated    Learning Readiness Ready    How often do you need to have someone help you when you read instructions, pamphlets, or other written materials from your doctor or pharmacy? 1 - Never    What is the last grade level you completed in school? 2 yrs college      Pre-Education Assessment   Patient understands the diabetes disease and treatment process. Needs Instruction    Patient understands incorporating nutritional management into lifestyle. Needs Instruction    Patient undertands incorporating physical activity into lifestyle. Needs Instruction    Patient understands using medications safely. Comprehends key points    Patient understands monitoring blood glucose, interpreting and using results Needs Instruction    Patient understands prevention, detection, and treatment of acute complications. Needs Instruction    Patient understands prevention,  detection, and treatment of chronic complications. Needs Instruction    Patient understands how to develop strategies to address psychosocial issues. Needs Instruction    Patient understands how to develop strategies to promote health/change behavior. Needs Instruction      Complications   Last HgB A1C per patient/outside source 6.5 %   04/30/2023   How often do you check your blood sugar? 1-2 times/day    Fasting Blood glucose range (mg/dL) 578-469    Have you had a dilated eye exam in the past  12 months? Yes    Have you had a dental exam in the past 12 months? No    Are you checking your feet? Yes    How many days per week are you checking your feet? 5      Dietary Intake   Breakfast Ham, 2 eggs, wheat toast, Coffee w/ cream    Lunch club sandwich on wheat toast, chips, unsweet tea    Dinner couple pieces of roast, 1 miller beer      Activity / Exercise   Activity / Exercise Type ADL's    How many days per week do you exercise? 2    How many minutes per day do you exercise? 15    Total minutes per week of exercise 30      Patient Education   Previous Diabetes Education No    Disease Pathophysiology Factors that contribute to the development of diabetes;Explored patient's options for treatment of their diabetes    Healthy Eating Role of diet in the treatment of diabetes and the relationship between the three main macronutrients and blood glucose level;Plate Method;Information on hints to eating out and maintain blood glucose control.    Being Active Role of exercise on diabetes management, blood pressure control and cardiac health.;Helped patient identify appropriate exercises in relation to his/her diabetes, diabetes complications and other health issue.    Medications Reviewed patients medication for diabetes, action, purpose, timing of dose and side effects.    Monitoring Identified appropriate SMBG and/or A1C goals.    Chronic complications Relationship between chronic complications and blood glucose control;Assessed and discussed foot care and prevention of foot problems    Diabetes Stress and Support Role of stress on diabetes    Lifestyle and Health Coping Lifestyle issues that need to be addressed for better diabetes care      Individualized Goals (developed by patient)   Nutrition Follow meal plan discussed    Physical Activity Exercise 3-5 times per week    Medications take my medication as prescribed    Monitoring  Test my blood glucose as discussed;Send in my  blood glucose log as discussed      Post-Education Assessment   Patient understands the diabetes disease and treatment process. Comprehends key points    Patient understands incorporating nutritional management into lifestyle. Comprehends key points    Patient undertands incorporating physical activity into lifestyle. Comprehends key points    Patient understands using medications safely. Comphrehends key points    Patient understands monitoring blood glucose, interpreting and using results Comprehends key points    Patient understands prevention, detection, and treatment of acute complications. N/A    Patient understands prevention, detection, and treatment of chronic complications. Comprehends key points    Patient understands how to develop strategies to address psychosocial issues. Comprehends key points    Patient understands how to develop strategies to promote health/change behavior. Comprehends key points      Outcomes   Expected Outcomes  Demonstrated interest in learning. Expect positive outcomes    Future DMSE 3-4 months    Program Status Not Completed             Individualized Plan for Diabetes Self-Management Training:   Learning Objective:  Patient will have a greater understanding of diabetes self-management. Patient education plan is to attend individual and/or group sessions per assessed needs and concerns.   Plan:   Patient Instructions  Switch to a decaf coffee when you drink it later in the day.  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  Work towards eating three meals a day, about 5-6 hours apart!  Begin to recognize carbohydrates, proteins, and non-starchy vegetables in your food choices!  Begin to build your meals using the proportions of the Balanced Plate. First, select your carb choice(s) for the meal. Make this 25% of your meal. Choose whole grains! Next,  select your source of protein to pair with your carb choice(s). Make this another 25% of your meal. Continue to choose LEAN proteins, and cut all visible fat off of your meats. Finally, complete your meal with a variety of non-starchy vegetables. Make this the remaining 50% of your meal.  Check your blood sugar each morning before eating or drinking (fasting). Look for numbers under 130 mg/dL      Expected Outcomes:  Demonstrated interest in learning. Expect positive outcomes  Education material provided: My Plate  If problems or questions, patient to contact team via:  Phone and Email  Future DSME appointment: 3-4 months

## 2023-06-01 NOTE — Patient Instructions (Addendum)
 Switch to a decaf coffee when you drink it later in the day.  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  Work towards eating three meals a day, about 5-6 hours apart!  Begin to recognize carbohydrates, proteins, and non-starchy vegetables in your food choices!  Begin to build your meals using the proportions of the Balanced Plate. First, select your carb choice(s) for the meal. Make this 25% of your meal. Choose whole grains! Next, select your source of protein to pair with your carb choice(s). Make this another 25% of your meal. Continue to choose LEAN proteins, and cut all visible fat off of your meats. Finally, complete your meal with a variety of non-starchy vegetables. Make this the remaining 50% of your meal.  Check your blood sugar each morning before eating or drinking (fasting). Look for numbers under 130 mg/dL

## 2023-08-02 ENCOUNTER — Other Ambulatory Visit

## 2023-08-02 DIAGNOSIS — E349 Endocrine disorder, unspecified: Secondary | ICD-10-CM

## 2023-08-02 DIAGNOSIS — N138 Other obstructive and reflux uropathy: Secondary | ICD-10-CM

## 2023-08-02 DIAGNOSIS — N529 Male erectile dysfunction, unspecified: Secondary | ICD-10-CM

## 2023-08-02 DIAGNOSIS — E291 Testicular hypofunction: Secondary | ICD-10-CM

## 2023-08-03 LAB — PSA: Prostate Specific Ag, Serum: 0.4 ng/mL (ref 0.0–4.0)

## 2023-08-03 LAB — HEMOGLOBIN AND HEMATOCRIT, BLOOD
Hematocrit: 49.6 % (ref 37.5–51.0)
Hemoglobin: 16.5 g/dL (ref 13.0–17.7)

## 2023-08-03 LAB — TESTOSTERONE: Testosterone: 226 ng/dL — ABNORMAL LOW (ref 264–916)

## 2023-08-05 ENCOUNTER — Other Ambulatory Visit: Payer: Medicare Other

## 2023-08-05 ENCOUNTER — Other Ambulatory Visit

## 2023-08-10 ENCOUNTER — Ambulatory Visit: Payer: Medicare Other | Admitting: Urology

## 2023-08-10 ENCOUNTER — Encounter: Payer: Self-pay | Admitting: Urology

## 2023-08-10 VITALS — BP 129/75 | HR 53 | Ht 66.0 in | Wt 170.0 lb

## 2023-08-10 DIAGNOSIS — N529 Male erectile dysfunction, unspecified: Secondary | ICD-10-CM | POA: Diagnosis not present

## 2023-08-10 DIAGNOSIS — N138 Other obstructive and reflux uropathy: Secondary | ICD-10-CM | POA: Diagnosis not present

## 2023-08-10 DIAGNOSIS — E291 Testicular hypofunction: Secondary | ICD-10-CM

## 2023-08-10 DIAGNOSIS — N401 Enlarged prostate with lower urinary tract symptoms: Secondary | ICD-10-CM

## 2023-08-10 MED ORDER — AMBULATORY NON FORMULARY MEDICATION: 0 refills | Status: DC

## 2023-08-10 NOTE — Progress Notes (Signed)
 08/10/2023 9:18 AM   Lawrence Jensen 09/08/1950 [id number]  Referring provider: Lemar Pyles, NP [address] [place],  [place] [address]  Urological history: 1.  Hypogonadism - Testosterone  level (07/2023) 226 - Hemoglobin/hematocrit (07/2023) 16.5/49.6 - Testosterone  cypionate 200 mg/mL, 0.5 cc every 10 days  2. ED - Failed PDE 5 inhibitors and VED  3. BPH with LU TS  - PSA (07/2023) 0.4  4.  Family history of prostate cancer - Father diagnosed with prostate cancer at age 19  Chief Complaint  Patient presents with   Follow-up   HPI: Lawrence Jensen is a 73 y.o. man who presents today for 28-month follow-up for hypogonadism.  Previous records reviewed.   He has been seen by his PCP and has been referred to dermatology for possible basal cell carcinoma on the nose.    He states he feels he has more energy since starting his testosterone  cypionate.  I PSS 7/3  He has an occasional urge to urinate.  Patient denies any modifying or aggravating factors.  Patient denies any recent UTI's, gross hematuria, dysuria or suprapubic/flank pain.  Patient denies any fevers, chills, nausea or vomiting.     IPSS     Row Name 08/10/23 0800         International Prostate Symptom Score   How often have you had the sensation of not emptying your bladder? Not at All     How often have you had to urinate less than every two hours? Less than 1 in 5 times     How often have you found you stopped and started again several times when you urinated? Less than 1 in 5 times     How often have you found it difficult to postpone urination? About half the time     How often have you had a weak urinary stream? Less than 1 in 5 times     How often have you had to strain to start urination? Not at All     How many times did you typically get up at night to urinate? 1 Time     Total IPSS Score 7       Quality of Life due to urinary symptoms   If you were to spend the rest of your life  with your urinary condition just the way it is now how would you feel about that? Mixed              Score:  1-7 Mild 8-19 Moderate 20-35 Severe   SHIM 21  He has not had no response to PDE 5 inhibitors or ICI.  He would like to try a higher potency of the Trimix.  Patient is not having spontaneous erections.  He denies any pain or curvature with erections.     SHIM     Row Name 08/10/23 0850         SHIM: Over the last 6 months:   How do you rate your confidence that you could get and keep an erection? Very Low     When you had erections with sexual stimulation, how often were your erections hard enough for penetration (entering your partner)? Almost Always or Always     During sexual intercourse, how often were you able to maintain your erection after you had penetrated (entered) your partner? Almost Always or Always     During sexual intercourse, how difficult was it to maintain your erection to completion of intercourse? Not  Difficult     When you attempted sexual intercourse, how often was it satisfactory for you? Almost Always or Always       SHIM Total Score   SHIM 21              Score: 1-7 Severe ED 8-11 Moderate ED 12-16 Mild-Moderate ED 17-21 Mild ED 22-25 No ED  PMH: Past Medical History:  Diagnosis Date   Diabetes mellitus without complication (HCC)    ED (erectile dysfunction)    Hyperlipidemia    Hypertension     Surgical History: Past Surgical History:  Procedure Laterality Date   APPENDECTOMY  04/13/1997   TOOTH EXTRACTION      Home Medications:  Allergies as of 08/10/2023   No Known Allergies      Medication List        Accurate as of August 10, 2023  9:18 AM. If you have any questions, ask your nurse or doctor.          2-3CC SYRINGE 3 ML Misc 1 mg by Does not apply route once a week.   Accu-Chek Aviva Plus test strip Generic drug: glucose blood USE AS INSTRUCTED   AMBULATORY NON FORMULARY MEDICATION Trimix  (30/1/50)-(Pap/Phent/PGE)  Test Dose  1ml vial   Qty #3 Refills 0  [facility] [phone] Fax [phone] Started by: Matilde Son   amLODipine  5 MG tablet Commonly known as: NORVASC  Take 1 tablet (5 mg total) by mouth daily.   aspirin EC 81 MG tablet Take 81 mg by mouth daily.   BD Disp Needles 18G X 1-1/2" Misc Generic drug: NEEDLE (DISP) 18 G 1 mg by Does not apply route once a week.   BD Disp Needles 21G X 1-1/2" Misc Generic drug: NEEDLE (DISP) 21 G 1 mg by Does not apply route once a week.   COD LIVER OIL PO Take 2 tablets by mouth daily.   lisinopril  20 MG tablet Commonly known as: ZESTRIL  Take 1 tablet (20 mg total) by mouth daily.   Magnesium 400 MG Tabs Take 400 mg by mouth 2 (two) times daily.   meloxicam  15 MG tablet Commonly known as: MOBIC  Take 1 tablet by mouth once daily What changed:  when to take this reasons to take this   metFORMIN  1000 MG tablet Commonly known as: GLUCOPHAGE  Take 1 tablet (1,000 mg total) by mouth 2 (two) times daily with a meal.   ONE TOUCH ULTRA 2 w/Device Kit Inject 1 kit into the skin 3 (three) times daily.   OT ULTRA/FASTTK CNTRL SOLN Soln   rosuvastatin  20 MG tablet Commonly known as: CRESTOR  Take 1 tablet (20 mg total) by mouth daily.   testosterone  cypionate 200 MG/ML injection Commonly known as: DEPOTESTOSTERONE CYPIONATE INJECT 1/2 (ONE-HALF) ML INTRAMUSCULARLY  ONCE A WEEK        Allergies: No Known Allergies  Family History: Family History  Problem Relation Age of Onset   Heart disease Mother    Heart disease Father    Congestive Heart Failure Father    Cancer Father        prostate   Diabetes Father    Stroke Father    Hypertension Father    Cancer Brother    Stroke Maternal Grandmother    Heart disease Brother    COPD Neg Hx     Social History:  reports that he quit smoking about 28 years ago. His smoking use included cigarettes. He started smoking about 63 years ago.  He  has a 35 pack-year smoking history. He has never used smokeless tobacco. He reports current alcohol use of about 4.0 - 5.0 standard drinks of alcohol per week. He reports that he does not use drugs.  ROS: Pertinent ROS in HPI  Physical Exam: BP 129/75   Pulse (!) 53   Ht 5\' 6"  (1.676 m)   Wt 170 lb (77.1 kg)   BMI 27.44 kg/m   Constitutional:  Well nourished. Alert and oriented, No acute distress. HEENT:  AT, moist mucus membranes.  Trachea midline Cardiovascular: No clubbing, cyanosis, or edema. Respiratory: Normal respiratory effort, no increased work of breathing. Neurologic: Grossly intact, no focal deficits, moving all 4 extremities. Psychiatric: Normal mood and affect.  Laboratory Data: Lab Results  Component Value Date   WBC 4.9 10/28/2022   HGB 16.5 08/02/2023   HCT 49.6 08/02/2023   MCV 93 10/28/2022   PLT 206 10/28/2022    Lab Results  Component Value Date   CREATININE 1.14 04/30/2023    Lab Results  Component Value Date   TESTOSTERONE  226 (L) 08/02/2023    Lab Results  Component Value Date   HGBA1C 6.5 (H) 04/30/2023    Lab Results  Component Value Date   TSH 2.570 10/28/2022       Component Value Date/Time   CHOL 112 04/30/2023 0815   CHOL 99 03/01/2018 0836   HDL 35 (L) 04/30/2023 0815   VLDL 17 03/01/2018 0836   LDLCALC 56 04/30/2023 0815    Lab Results  Component Value Date   AST 18 04/30/2023   Lab Results  Component Value Date   ALT 10 04/30/2023  I have reviewed the labs.   Pertinent Imaging: N/A  Assessment & Plan:    1. Hypogonadism  -testosterone  levels are subtherapeutic, but he reports good energy -H & H normal -continue testosterone  cypionate 200 mg/milliliters, 0.5 cc every 10 days  2. BPH with LUTS -PSA stable -symptoms -occasional urgency -continue conservative management, avoiding bladder irritants and timed voiding's  3. Erectile dysfunction:    - He would like to try higher potency of ICI  medication - I feel that this is appropriate and recommend that we do a repeat titration with a higher potency which he is agreeable - I sent a prescription for Trimix (30/1/50) and he will be scheduled for a titration appointment    Return in about 3 months (around 11/09/2023) for Testosterone , hemoglobin, hematocrit-only.  And Trimix titration appointment   These notes generated with voice recognition software. I apologize for typographical errors.  Briant Camper  [facility] [address]  [address] [place], [place] [address] [phone]

## 2023-08-10 NOTE — Addendum Note (Signed)
 Addended by: Darlen Eglin on: 08/10/2023 09:23 AM   Modules accepted: Orders

## 2023-08-26 ENCOUNTER — Ambulatory Visit: Admitting: Urology

## 2023-08-26 ENCOUNTER — Encounter: Payer: Self-pay | Admitting: Urology

## 2023-08-26 VITALS — BP 114/68 | HR 60 | Ht 66.0 in | Wt 170.0 lb

## 2023-08-26 DIAGNOSIS — N529 Male erectile dysfunction, unspecified: Secondary | ICD-10-CM

## 2023-08-26 NOTE — Patient Instructions (Signed)
TRIMIX SELF-INJECTION INSTRUCTIONS    DETAILED PROCEDURE  1. GETTING SET UP  A. Proper hygiene is important. Wash your hands and keep the penis clean.  B. Assemble the following:  - Bottle of Trimix  - Alcohol pad  - Syringe  C. Keep the Trimix cold by returning the bottle to the refrigerator, or by placing the bottle in a cup of ice.   2. PREPARE THE SYRINGE  A. Wipe the rubber top of the vial with an alcohol pad.  B. After removing the cap of the needle, pull the plunger back to the desired dosage, filling this volume with air. Use a new needle and syringe each time.  C. Insert the needle through the rubber top and inject the air into the vial.  D. Turn the vial with needle and syringe inserted upside down. Pull back on the syringe plunger in a slow and steady motion until the desired dosage is achieved.  E. Tap the side of the syringe (1cc tuberculin syringe with a 29 gauge needle) to allow any air bubbles to float towards the needle. Avoid having these air bubbles in the syringe when self-injecting by first injecting out the collected bubbles that may form.  F. Remove the needle from the bottle and replace the protective cap on the needle.    3. SELECT AND PREPARE THE SITE FOR INJECTION  A. The proper location for injection is at the 9-11 and 1-3 o'clock positions, between the base and mid-portion of the penis.(see diagram) Avoid the midline because of potential for injury to the urethra (6 o'clock; for urinary passage) and the penile arteries and nerves (near 12 o'clock). Avoid any visible veins or arteries on the surface.  B. Grasp and pull the head of the penis toward the side of your leg with the index finger and thumb (use the left hand, if right handed). While maintaining light tension, select a site for injection.  C. Clean the site with an alcohol pad.   4: INJECT TRIMIX AND APPLY COMPRESSION  A. With a steady and continuous motion, penetrate the skin with the needle at a 90 o  angle. The needle should then be advanced to the hub. Slight resistance is encountered as the needle passes into the proper position within the erectile tissue (corporeal body).  B. Inject the Trimix over approximately 4 seconds. Withdraw the needle from the penis and apply compression to the injection site for approximately 1 minute. Several minutes of compression may be required to avoid bleeding, especially if you are an aspirin user.  C. Replace the cap on the needle and dispose of properly.   If you experience a painful erection that will not go down, take four (30 mg) tablets of pseudoephedrine (Sudafed-not the extended release) and if the erection does not go down in the next hour or increases in pain, contact the office immediately or seek treatment in the ED    

## 2023-08-26 NOTE — Progress Notes (Signed)
   08/26/2023 8:57 AM  Lawrence Jensen 02-Jun-1950 [id number]   Referring provider: Lemar Pyles, NP [address] [place],  [place] [address]  Urological history: 1.  Hypogonadism - Testosterone  level (07/2023) 226 - Hemoglobin/hematocrit (07/2023) 16.5/49.6 - Testosterone  cypionate 200 mg/mL, 0.5 cc every 10 days   2. ED - Failed PDE 5 inhibitors and VED   3. BPH with LU TS  - PSA (07/2023) 0.4   4.  Family history of prostate cancer - Father diagnosed with prostate cancer at age 54  Chief Complaint  Patient presents with   Other    HPI: Lawrence Jensen is a 73 y.o. male who presents today for Trimix titration w/ Super Trimix   Previous records reviewed.     The procedure is discussed with patient.  He is allowed to ask questions.  Questions were answered to his satisfaction.  We were able to proceed to the titration.  He did only bring 1 needle, so we could not do the titration this morning.  I did inject 0.3 cc of the Trimix (30/1/50)   Physical Exam:  BP 114/68   Pulse 60   Ht 5\' 6"  (1.676 m)   Wt 170 lb (77.1 kg)   BMI 27.44 kg/m   Constitutional:  Well nourished. Alert and oriented, No acute distress. GU: No CVA tenderness.  No bladder fullness or masses.  Patient with uncircumcised phallus.   Foreskin easily retracted.  Urethral meatus is patent.  No penile discharge. No penile lesions or rashes.  Psychiatric: Normal mood and affect.   Procedure  Patient's left corpus cavernosum is identified.  An area near the base of the penis is cleansed with rubbing alcohol.  Careful to avoid the dorsal vein, 2 mcg of Trimix (papaverine 30 mg, phentolamine 1 mg and prostaglandin E1 50 mcg, Lot # [id number]  exp # 09/25/2023 is injected at a 90 degree angle into the left corpus cavernosum near the base of the penis.  Patient experienced penile fullness in 15 minutes.    Assessment & Plan:    1.  Erectile dysfunction - Patient is comfortable with ICI injections,  so the next time he injects he will inject 0.4 cc and if he does not get a satisfactory erection, the next time he will inject 0.5 cc that if he does not get a satisfactory erection, the next time he will inject 0.6 cc and if he does not get a satisfactory erection he will contact the office and we will have further discussion -If he does have a satisfactory erection he will let us  know what dose he needs so that [facility] - Advised patient of the condition of priapism, painful erection lasting for more than four hours, and to contact the office or seek treatment in the ED immediately    Return for keep follow up appointments.  Matilde Son, PA-C   [facility] [address] [place], [place] [address] [phone]   I spent 20 minutes on the day of the encounter to include pre-visit record review, face-to-face time with the patient, and post-visit ordering of tests.

## 2023-08-27 ENCOUNTER — Other Ambulatory Visit: Payer: Self-pay | Admitting: Nurse Practitioner

## 2023-08-31 NOTE — Telephone Encounter (Signed)
 Requested Prescriptions  Pending Prescriptions Disp Refills   glucose blood (ACCU-CHEK AVIVA PLUS) test strip [Pharmacy Med Name: Accu-Chek Aviva Plus In Vitro Strip] 200 each 0    Sig: USE AS DIRECTED     There is no refill protocol information for this order

## 2023-09-01 ENCOUNTER — Encounter: Payer: Self-pay | Admitting: Dietician

## 2023-09-01 ENCOUNTER — Encounter: Payer: Medicare Other | Attending: Nurse Practitioner | Admitting: Dietician

## 2023-09-01 DIAGNOSIS — Z713 Dietary counseling and surveillance: Secondary | ICD-10-CM | POA: Diagnosis not present

## 2023-09-01 DIAGNOSIS — E1140 Type 2 diabetes mellitus with diabetic neuropathy, unspecified: Secondary | ICD-10-CM | POA: Diagnosis present

## 2023-09-01 NOTE — Progress Notes (Signed)
 Diabetes Self-Management Education  Visit Type: Follow-up  Appt. Start Time: 0810 Appt. End Time: 0845  09/01/2023  [name], identified by name and date of birth, is a 73 y.o. male with a diagnosis of Diabetes:  .   ASSESSMENT Pt reports period of elevated blood sugars, states they were stressed related to skin cancer removal procedures. Pt reports continuing to check FBG daily, numbers have returned to 120-130 mg/dL over the last 3-4 weeks.  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 Pt reports switching to decaf coffee entirely, drinks this mostly during the day, only drinking ~24 oz of plain water. Pt reports wife is trying to prepare their meals according to the balanced plate model, incorporating more veggies at meals and bringing vegetables to snack on at work occasionally. Pt reports occasionally snacking on the evening, may have a beer in the evening once in a while as well.   Diabetes Self-Management Education - 09/01/23 0902       Visit Information   Visit Type Follow-up      Pre-Education Assessment   Patient understands the diabetes disease and treatment process. Comprehends key points    Patient understands incorporating nutritional management into lifestyle. Comprehends key points    Patient undertands incorporating physical activity into lifestyle. Demonstrates understanding / competency    Patient understands using medications safely. Demonstrates understanding / competency    Patient understands monitoring blood glucose, interpreting and using results Comprehends key points    Patient understands prevention, detection, and treatment of acute complications. N/A (comment)    Patient understands prevention, detection, and treatment of chronic complications. Compreheands key points    Patient understands how to develop strategies to address  psychosocial issues. Comprehends key points    Patient understands how to develop strategies to promote health/change behavior. Comprehends key points      Complications   How often do you check your blood sugar? 1-2 times/day    Fasting Blood glucose range (mg/dL) 161-096;04-540      Dietary Intake   Breakfast 2 scrambled eggs w/ onions, tenderloin, 2 slices wheat toast w/ a little syrup, coffee w/ cream    Lunch Egg omelette sandwich on wheat toast,    Snack (afternoon) A couple handfuls of walnuts, black coffee    Beverage(s) coffee, water      Activity / Exercise   Activity / Exercise Type ADL's;Light (walking / raking leaves);Moderate (swimming / aerobic walking)   Weight lifting, pull ups   How many days per week do you exercise? 4    How many minutes per day do you exercise? 15    Total minutes per week of exercise 60      Patient Education   Disease Pathophysiology Factors that contribute to the development of diabetes    Healthy Eating Role of diet in the treatment of diabetes and the relationship between the three main macronutrients and blood glucose level;Plate Method    Being Active Helped patient identify appropriate exercises in relation to his/her diabetes, diabetes complications and other health issue.;Role of exercise on diabetes management, blood pressure control and cardiac health.    Monitoring Identified appropriate SMBG and/or A1C goals.    Diabetes Stress and Support Role of stress on diabetes;Identified and addressed patients feelings and concerns about diabetes      Individualized Goals (developed by patient)   Nutrition Follow meal plan  discussed    Physical Activity Exercise 3-5 times per week    Medications take my medication as prescribed    Monitoring  Test my blood glucose as discussed;Send in my blood glucose log as discussed    Reducing Risk examine blood glucose patterns      Patient Self-Evaluation of Goals - Patient rates self as meeting  previously set goals (% of time)   Nutrition 50 - 75 % (half of the time)    Physical Activity 50 - 75 % (half of the time)    Medications >75% (most of the time)    Monitoring >75% (most of the time)    Problem Solving and behavior change strategies  50 - 75 % (half of the time)    Reducing Risk (treating acute and chronic complications) 50 - 75 % (half of the time)    Health Coping 50 - 75 % (half of the time)      Post-Education Assessment   Patient understands the diabetes disease and treatment process. Comprehends key points    Patient understands incorporating nutritional management into lifestyle. Comprehends key points    Patient undertands incorporating physical activity into lifestyle. Demonstrates understanding / competency    Patient understands using medications safely. Demonstrates understanding / competency    Patient understands monitoring blood glucose, interpreting and using results Comprehends key points    Patient understands prevention, detection, and treatment of acute complications. N/A    Patient understands prevention, detection, and treatment of chronic complications. Comprehends key points    Patient understands how to develop strategies to address psychosocial issues. Comprehends key points    Patient understands how to develop strategies to promote health/change behavior. Comprehends key points      Outcomes   Expected Outcomes Demonstrated interest in learning. Expect positive outcomes    Future DMSE 3-4 months    Program Status Not Completed      Subsequent Visit   Since your last visit have you continued or begun to take your medications as prescribed? Yes    Since your last visit have you had your blood pressure checked? Yes    Is your most recent blood pressure lower, unchanged, or higher since your last visit? Lower    Since your last visit have you experienced any weight changes? No change    Since your last visit, are you checking your blood glucose  at least once a day? Yes             Individualized Plan for Diabetes Self-Management Training:   Learning Objective:  Patient will have a greater understanding of diabetes self-management. Patient education plan is to attend individual and/or group sessions per assessed needs and concerns.   Plan:   Patient Instructions  Keep up the great work increasing your exercise.  Take a bottle of plain water (with lemon) with you to work along with your bottle of coffee every day.  If having a meal later in the evening after work, make it a combination of protein and vegetables without a starch.  When reading up on health information, go to http://www.myers.net/ for good research based information!  Look for your fasting blood sugar numbers to continue to lower gradually. Your goal is have numbers in the 110s consistently.  Expected Outcomes:  Demonstrated interest in learning. Expect positive outcomes  If problems or questions, patient to contact team via:  Phone and Email  Future DSME appointment: 3-4 months

## 2023-09-01 NOTE — Patient Instructions (Addendum)
 Keep up the great work increasing your exercise.  Take a bottle of plain water (with lemon) with you to work along with your bottle of coffee every day.  If having a meal later in the evening after work, make it a combination of protein and vegetables without a starch.  When reading up on health information, go to http://www.myers.net/ for good research based information!  Look for your fasting blood sugar numbers to continue to lower gradually. Your goal is have numbers in the 110s consistently.

## 2023-09-13 ENCOUNTER — Telehealth: Payer: Self-pay | Admitting: Nurse Practitioner

## 2023-09-13 ENCOUNTER — Other Ambulatory Visit: Payer: Self-pay | Admitting: Nurse Practitioner

## 2023-09-13 MED ORDER — ACCU-CHEK AVIVA PLUS VI STRP: 1.0000 | ORAL_STRIP | Freq: Two times a day (BID) | 3 refills | Status: AC

## 2023-09-13 NOTE — Telephone Encounter (Signed)
 Error

## 2023-09-13 NOTE — Telephone Encounter (Signed)
 Patient came in and stated that he is down to two more strips and would like to have more sent to [facility] in [place] on [address]

## 2023-09-14 ENCOUNTER — Telehealth: Payer: Self-pay | Admitting: Urology

## 2023-09-14 NOTE — Telephone Encounter (Signed)
 PT LMOM the meds Cathleen Coach gave him for impotence isn't working and wants to know if he can get a smaller dose.

## 2023-09-17 NOTE — Telephone Encounter (Signed)
 [facility]  What med is pt referring to?

## 2023-09-20 ENCOUNTER — Telehealth: Payer: Self-pay | Admitting: Urology

## 2023-09-20 NOTE — Telephone Encounter (Signed)
 Pt came into office requesting for some to [facility] Pres[facility]. Pt is stating he would like a smaller dose.

## 2023-09-21 ENCOUNTER — Encounter: Payer: Self-pay | Admitting: Nurse Practitioner

## 2023-09-21 ENCOUNTER — Ambulatory Visit: Admitting: Nurse Practitioner

## 2023-09-21 ENCOUNTER — Ambulatory Visit: Payer: Self-pay | Admitting: Nurse Practitioner

## 2023-09-21 VITALS — BP 132/64 | HR 67 | Temp 99.0°F | Ht 66.0 in | Wt 178.6 lb

## 2023-09-21 DIAGNOSIS — E1140 Type 2 diabetes mellitus with diabetic neuropathy, unspecified: Secondary | ICD-10-CM

## 2023-09-21 LAB — BAYER DCA HB A1C WAIVED: HB A1C (BAYER DCA - WAIVED): 5.9 % — ABNORMAL HIGH (ref 4.8–5.6)

## 2023-09-21 NOTE — Progress Notes (Signed)
 Left HIPAA compliant message

## 2023-09-21 NOTE — Progress Notes (Signed)
 BP 132/64 (BP Location: Left Arm, Patient Position: Sitting, Cuff Size: Normal)   Pulse 67   Temp 99 F (37.2 C) (Oral)   Ht 5\' 6"  (1.676 m)   Wt 178 lb 9.6 oz (81 kg)   SpO2 97%   BMI 28.83 kg/m    Subjective:    Patient ID: Lawrence Jensen, male    DOB: 1951/03/27, 73 y.o.   MRN: [id number]  HPI: Lawrence Jensen is a 73 y.o. 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 Last A1C result available was from January and patient states he has to be within 90 days.    Referral    Patient states that he needs a referral to neurology to discuss his peripheral neuropathy. States he was directed by his employer to have this done so he can be passed to have his DOT physical.    DIABETES Presents today as needs an updated A1c for his DOT physical and new company that performs DOT exams is requesting he see neurology for his peripheral neuropathy. Last A1c was 6.5%. Hypoglycemic episodes:no Polydipsia/polyuria: no Visual disturbance: no Chest pain: no Paresthesias: no Glucose Monitoring: yes  Accucheck frequency: Daily  Fasting glucose: <130 on average  Post prandial:  Evening:  Before meals: Taking Insulin?: no  Long acting insulin:  Short acting insulin: Blood Pressure Monitoring: daily Retinal Examination: Up to Date Foot Exam: Up to Date Diabetic Education: Completed Pneumovax: Refuses Influenza: Refuses Aspirin: yes   Relevant past medical, surgical, family and social history reviewed and updated as indicated. Interim medical history since our last visit reviewed. Allergies and medications reviewed and updated.  Review of Systems  Constitutional:  Negative for activity change, diaphoresis, fatigue and fever.  Respiratory:  Negative for cough, chest tightness, shortness of breath and wheezing.   Cardiovascular:  Negative for chest pain, palpitations and  leg swelling.  Gastrointestinal: Negative.   Endocrine: Negative for cold intolerance, heat intolerance, polydipsia, polyphagia and polyuria.  Neurological: Negative.   Psychiatric/Behavioral: Negative.      Per HPI unless specifically indicated above     Objective:     BP 132/64 (BP Location: Left Arm, Patient Position: Sitting, Cuff Size: Normal)   Pulse 67   Temp 99 F (37.2 C) (Oral)   Ht 5\' 6"  (1.676 m)   Wt 178 lb 9.6 oz (81 kg)   SpO2 97%   BMI 28.83 kg/m   Wt Readings from Last 3 Encounters:  09/21/23 178 lb 9.6 oz (81 kg)  08/26/23 170 lb (77.1 kg)  08/10/23 170 lb (77.1 kg)    Physical Exam Vitals and nursing note reviewed.  Constitutional:      General: He is awake. He is not in acute distress.    Appearance: He is well-developed and well-groomed. He is not ill-appearing or toxic-appearing.  HENT:     Head: Normocephalic.     Right Ear: Hearing and external ear normal.     Left Ear: Hearing and external ear normal.  Eyes:     General: Lids are normal.     Extraocular Movements: Extraocular movements intact.     Conjunctiva/sclera: Conjunctivae normal.  Neck:     Thyroid : No thyromegaly.     Vascular: No carotid bruit.  Cardiovascular:     Rate and Rhythm: Normal rate and regular rhythm.  Heart sounds: Normal heart sounds. No murmur heard.    No gallop.  Pulmonary:     Effort: No accessory muscle usage or respiratory distress.     Breath sounds: Normal breath sounds.  Abdominal:     General: Bowel sounds are normal. There is no distension.     Palpations: Abdomen is soft.     Tenderness: There is no abdominal tenderness.  Musculoskeletal:     Cervical back: Full passive range of motion without pain.     Right lower leg: No edema.     Left lower leg: No edema.  Lymphadenopathy:     Cervical: No cervical adenopathy.  Skin:    General: Skin is warm.     Capillary Refill: Capillary refill takes less than 2 seconds.  Neurological:     Mental  Status: He is alert and oriented to person, place, and time.     Deep Tendon Reflexes: Reflexes are normal and symmetric.     Reflex Scores:      Brachioradialis reflexes are 2+ on the right side and 2+ on the left side.      Patellar reflexes are 2+ on the right side and 2+ on the left side. Psychiatric:        Attention and Perception: Attention normal.        Mood and Affect: Mood normal.        Speech: Speech normal.        Behavior: Behavior normal. Behavior is cooperative.        Thought Content: Thought content normal.     Results for orders placed or performed in visit on 08/02/23  Testosterone    Collection Time: 08/02/23  8:26 AM  Result Value Ref Range   Testosterone  226 (L) 264 - 916 ng/dL  Hemoglobin and hematocrit, blood   Collection Time: 08/02/23  8:26 AM  Result Value Ref Range   Hemoglobin 16.5 13.0 - 17.7 g/dL   Hematocrit 45.4 09.8 - 51.0 %  PSA   Collection Time: 08/02/23  8:26 AM  Result Value Ref Range   Prostate Specific Ag, Serum 0.4 0.0 - 4.0 ng/mL      Assessment & Plan:   Problem List Items Addressed This Visit       Endocrine   Type 2 diabetes mellitus with diabetic neuropathy, without long-term current use of insulin (HCC) - Primary   Chronic, ongoing.  A1c 6.5% in January and stable. Often is in 6 range. 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  Urine ALB 150 January 2025, will continue Lisinopril  for kidney protection.  Continue current medication regimen and adjust as needed.  Recommend continued focus on daily exercise, which he has started again, and healthy diet.  Return in 6 months.  Could consider addition of Farxiga or Jardiance if elevations in future. - Refuses all vaccinations, have discussed multiple times with patient. - Foot and eye exams up to date. - ACE and Statin on board. - Referral to neurology per DOT examiners request due to his peripheral neuropathy.      Relevant Orders   Ambulatory referral to Neurology   [facility] Hb  A1c Waived     Follow up plan: Return for as scheduled in August for Physical.

## 2023-09-21 NOTE — Patient Instructions (Signed)

## 2023-09-21 NOTE — Telephone Encounter (Signed)
 LMTCO.

## 2023-09-21 NOTE — Assessment & Plan Note (Signed)
 Chronic, ongoing.  A1c 6.5% in January and stable. Often is in 6 range. 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  Urine ALB 150 January 2025, will continue Lisinopril  for kidney protection.  Continue current medication regimen and adjust as needed.  Recommend continued focus on daily exercise, which he has started again, and healthy diet.  Return in 6 months.  Could consider addition of Farxiga or Jardiance if elevations in future. - Refuses all vaccinations, have discussed multiple times with patient. - Foot and eye exams up to date. - ACE and Statin on board. - Referral to neurology per DOT examiners request due to his peripheral neuropathy.

## 2023-09-22 NOTE — Telephone Encounter (Signed)
 Patient is scheduled for 10/14/23 at 8:20 in [place].

## 2023-09-22 NOTE — Telephone Encounter (Signed)
 Pt aware per VM.   Advised to contact office for appt if he would like to proceed with another Trimix.

## 2023-09-22 NOTE — Telephone Encounter (Signed)
 Called and spoke to pt. Pt states he wanted a stronger a dose of the Trimix  not a smaller dose.   He states the shot is not making his penis hard enough to use. Per pt it swells but he can't use it.   He called the compounding pharmacy and they told him there is a stronger does of the Trimix.  Pt aware will send message to [facility] for next steps.   Pls advise.

## 2023-09-30 ENCOUNTER — Telehealth: Payer: Self-pay | Admitting: Nurse Practitioner

## 2023-09-30 NOTE — Telephone Encounter (Signed)
 Copied from CRM [id number]. Topic: Referral - Question >> Sep 29, 2023  2:53 PM Fonda T wrote: Reason for CRM: Patient calling would like referral sent to [facility] to see Dr. Walden Guise (he has seen this provider previously) for Neurology in  [place].   Referral is for Type 2 diabetes mellitus with diabetic neuropathy, without long-term current use of insulin (HCC).  Patient states he is upset because he was referred to an office in [place], without his knowledge.  Patient is requesting referral to be placed as soon as possible due to delay in making appointment because sent to incorrect office location.  Please follow up with patient at [phone], as he is requesting a follow up call as soon as possible.

## 2023-09-30 NOTE — Telephone Encounter (Signed)
 Called and notified patient that referral has been sent to [facility] for him. Patient states that they have already reached out to him this morning.

## 2023-09-30 NOTE — Telephone Encounter (Signed)
 Routing to referral team. Can this referral be resent for the patient?

## 2023-10-08 ENCOUNTER — Other Ambulatory Visit: Payer: Self-pay | Admitting: Urology

## 2023-10-08 DIAGNOSIS — N529 Male erectile dysfunction, unspecified: Secondary | ICD-10-CM

## 2023-10-08 MED ORDER — AMBULATORY NON FORMULARY MEDICATION: 0 refills | Status: AC

## 2023-10-12 NOTE — Telephone Encounter (Signed)
 error

## 2023-10-14 ENCOUNTER — Ambulatory Visit: Admitting: Urology

## 2023-10-14 ENCOUNTER — Encounter: Payer: Self-pay | Admitting: Urology

## 2023-10-14 VITALS — BP 112/53 | HR 81 | Ht 66.0 in | Wt 175.0 lb

## 2023-10-14 DIAGNOSIS — E291 Testicular hypofunction: Secondary | ICD-10-CM

## 2023-10-14 DIAGNOSIS — N486 Induration penis plastica: Secondary | ICD-10-CM | POA: Diagnosis not present

## 2023-10-14 DIAGNOSIS — N138 Other obstructive and reflux uropathy: Secondary | ICD-10-CM | POA: Diagnosis not present

## 2023-10-14 DIAGNOSIS — N529 Male erectile dysfunction, unspecified: Secondary | ICD-10-CM | POA: Diagnosis not present

## 2023-10-14 DIAGNOSIS — N401 Enlarged prostate with lower urinary tract symptoms: Secondary | ICD-10-CM

## 2023-10-14 MED ORDER — TADALAFIL 5 MG PO TABS: 5.0000 mg | ORAL_TABLET | Freq: Every day | ORAL | 11 refills | Status: DC | PRN

## 2023-10-14 NOTE — Progress Notes (Signed)
 10/14/2023 9:54 AM  Lawrence Jensen 1951/01/09 [id number]   Referring provider: Valerio Melanie DASEN, NP [address] [place],  [place] [address]  Urological history: 1.  Hypogonadism - Testosterone  level (07/2023) 226 - Hemoglobin/hematocrit (07/2023) 16.5/49.6 - Testosterone  cypionate 200 mg/mL, 0.5 cc every 10 days   2. ED - Failed PDE 5 inhibitors and VED   3. BPH with LU TS  - PSA (07/2023) 0.4   4.  Family history of prostate cancer - Father diagnosed with prostate cancer at age 29  Chief Complaint  Patient presents with   Trimix Titration    HPI: Lawrence Jensen is a 73 y.o. male who presents today for Trimix titration w/ Quad Trimix   Previous records reviewed.     He has had difficulty achieving and maintaining erections for over 6 years.  Reports decreased frequency and urgency erections.  Onset has been gradual.  Denies nocturnal or early morning erections.  No pain, penile curvature or ejaculatory issues reported.   He is currently married.  He has a 35-pack-year history of smoking but quit about 28 years ago.  He does drink about 4 to 5 cans of beer a week.  He has tried Viagra, Cialis, vacuum erectile device and some over-the-counter medication without success.   He has not responded to Trimix or super Trimix.  Physical Exam:  BP (!) 112/53   Pulse 81   Ht 5' 6 (1.676 m)   Wt 175 lb (79.4 kg)   BMI 28.25 kg/m   Constitutional:  Well nourished. Alert and oriented, No acute distress. GU: No CVA tenderness.  No bladder fullness or masses.  Patient with uncircumcised phallus.   Foreskin easily retracted.  Urethral meatus is patent.  No penile discharge. No penile lesions or rashes.   Peyronie's plaque is appreciated on the dorsal side of the penis. Psychiatric: Normal mood and affect.   Procedure  Patient's left corpus cavernosum is identified.  An area near the base of the penis is cleansed with rubbing alcohol.  Careful to avoid the dorsal vein, 2 mcg  of Quadmix (papaverine 15 mg, phentolamine 0.5 mg, prostaglandin E1 5 and atropine 0.2 mcg) Lot # [id number]$MzfnczAzqnmzIZPI_fSqTtfQVXVNAqVKcsZtbqMoXLzfnhFQR$$MzfnczAzqnmzIZPI_fSqTtfQVXVNAqVKcsZtbqMoXLzfnhFQR$  exp #[id number]  is injected at a 90 degree angle into the left corpus cavernosum near the base of the penis.  Patient experienced penile fullness in 15 minutes.    Patient's right corpus cavernosum is identified.  An area near the base of the penis is cleansed with rubbing alcohol.  Careful to avoid the dorsal vein, 2 mcg of Quadmix (papaverine 15 mg, phentolamine 0.5 mg, prostaglandin E1 5 and atropine 0.2 mcg) Lot # [id number]$MzfnczAzqnmzIZPI_jinupQugpujnYpVGRbwreBXLckkqZLMV$$MzfnczAzqnmzIZPI_jinupQugpujnYpVGRbwreBXLckkqZLMV$  exp #[id number]  is injected at a 90 degree angle into the right corpus cavernosum near the base of the penis.  Patient experienced penile fullness in 15 minutes.    Patient's left corpus cavernosum is identified.  An area near the base of the penis is cleansed with rubbing alcohol.  Careful to avoid the dorsal vein, 2 mcg of Quadmix (papaverine 15 mg, phentolamine 0.5 mg, prostaglandin E1 5 and atropine 0.2 mcg) Lot # [id number]$MzfnczAzqnmzIZPI_WYenSXMDYUMmAFWOmvGxIhmxwzUSOwWl$$MzfnczAzqnmzIZPI_WYenSXMDYUMmAFWOmvGxIhmxwzUSOwWl$  exp #[id number]  is injected at a 90 degree angle into the left corpus cavernosum near the base of the penis.  Patient experienced penile fullness in 15 minutes.    Assessment & Plan:    1.  Erectile dysfunction - We discussed how the Peyronie's plaque may be contributing to he has no responsiveness to ICI - I discussed treatment options  for the Peyronie's plaque, but he deferred at this time - We will have a trial of taking tadalafil 5 mg daily for 30 days to see if we can improve blood flow to the penis and he will inject 0.6 of the quad mix while on the tadalafil to see if he gets a better response - If he still is not responding with satisfactory erections to the quad mix and the tadalafil 5 mg daily, he would then like to pursue treatment for his Peyronie's plaque - Advised patient of the condition of priapism, painful erection lasting for more than four hours, and to contact the office or seek treatment in the ED immediately  - His tadalafil  5 mg daily will need prior approval, so we will base follow-up on when we receive that approval  2. Peyronie's disease - explained what a Peyronie's plaque is and how it may contribute to his erectile dysfunction - Expressed that I did see the possibility that he may have penile curvature during his titration appointment - We discussed the treatment option of Xiaflex, but he would defer at this time and may consider it later  3. Hypogonadism - Patient will continue testosterone  cypionate 200 mg/milliliter, 0.5 cc every 10 days  - Serum testosterone , hemoglobin and hematocrit levels drawn today   Return for pending lab results and PA for Cialis 5 mg daily .  Clotilda Cornwall, PA-C   [facility] [address] [place], [place] [address] [phone]   I spent 30 minutes on the day of the encounter to include pre-visit record review, face-to-face time with the patient, and post-visit ordering of tests.

## 2023-10-15 LAB — HEMOGLOBIN AND HEMATOCRIT, BLOOD
Hematocrit: 50.1 % (ref 37.5–51.0)
Hemoglobin: 16.8 g/dL (ref 13.0–17.7)

## 2023-10-15 LAB — TESTOSTERONE: Testosterone: 268 ng/dL (ref 264–916)

## 2023-10-19 ENCOUNTER — Ambulatory Visit: Payer: Self-pay | Admitting: Urology

## 2023-11-09 ENCOUNTER — Other Ambulatory Visit

## 2023-11-19 ENCOUNTER — Other Ambulatory Visit: Payer: Self-pay | Admitting: Nurse Practitioner

## 2023-11-23 NOTE — Telephone Encounter (Signed)
 Requested Prescriptions  Pending Prescriptions Disp Refills   meloxicam  (MOBIC ) 15 MG tablet [Pharmacy Med Name: Meloxicam  15 MG Oral Tablet] 90 tablet 0    Sig: Take 1 tablet by mouth once daily     Analgesics:  COX2 Inhibitors Failed - 11/23/2023 12:09 PM      Failed - Manual Review: Labs are only required if the patient has taken medication for more than 8 weeks.      Passed - HGB in normal range and within 360 days    Hemoglobin  Date Value Ref Range Status  10/14/2023 16.8 13.0 - 17.7 g/dL Final         Passed - Cr in normal range and within 360 days    Creatinine, Ser  Date Value Ref Range Status  04/30/2023 1.14 0.76 - 1.27 mg/dL Final         Passed - HCT in normal range and within 360 days    Hematocrit  Date Value Ref Range Status  10/14/2023 50.1 37.5 - 51.0 % Final         Passed - AST in normal range and within 360 days    AST  Date Value Ref Range Status  04/30/2023 18 0 - 40 IU/L Final   AST (SGOT) Piccolo, Waived  Date Value Ref Range Status  03/01/2018 26 11 - 38 U/L Final         Passed - ALT in normal range and within 360 days    ALT  Date Value Ref Range Status  04/30/2023 10 0 - 44 IU/L Final   ALT (SGPT) Piccolo, Waived  Date Value Ref Range Status  03/01/2018 27 10 - 47 U/L Final         Passed - eGFR is 30 or above and within 360 days    GFR calc Af Amer  Date Value Ref Range Status  03/28/2020 72 >59 mL/min/1.73 Final    Comment:    **In accordance with recommendations from the NKF-ASN Task force,**   [facility] is in the process of updating its eGFR calculation to the   2021 CKD-EPI creatinine equation that estimates kidney function   without a race variable.    GFR calc non Af Amer  Date Value Ref Range Status  03/28/2020 62 >59 mL/min/1.73 Final   eGFR  Date Value Ref Range Status  04/30/2023 69 >59 mL/min/1.73 Final         Passed - Patient is not pregnant      Passed - Valid encounter within last 12 months    Recent  Outpatient Visits           2 months ago Type 2 diabetes mellitus with diabetic neuropathy, without long-term current use of insulin (HCC)   [facility] Welcome, Melanie DASEN, NP       Future Appointments             In 6 days Cannady, Jolene T, NP [facility]

## 2023-11-27 NOTE — Patient Instructions (Signed)

## 2023-11-29 ENCOUNTER — Ambulatory Visit (INDEPENDENT_AMBULATORY_CARE_PROVIDER_SITE_OTHER): Payer: Self-pay | Admitting: Nurse Practitioner

## 2023-11-29 ENCOUNTER — Encounter: Payer: Self-pay | Admitting: Nurse Practitioner

## 2023-11-29 VITALS — BP 112/56 | HR 58 | Temp 98.7°F | Ht 66.0 in | Wt 179.4 lb

## 2023-11-29 DIAGNOSIS — E1159 Type 2 diabetes mellitus with other circulatory complications: Secondary | ICD-10-CM | POA: Diagnosis not present

## 2023-11-29 DIAGNOSIS — Z87891 Personal history of nicotine dependence: Secondary | ICD-10-CM

## 2023-11-29 DIAGNOSIS — Z7984 Long term (current) use of oral hypoglycemic drugs: Secondary | ICD-10-CM

## 2023-11-29 DIAGNOSIS — Z0000 Encounter for general adult medical examination without abnormal findings: Secondary | ICD-10-CM | POA: Diagnosis not present

## 2023-11-29 DIAGNOSIS — E1140 Type 2 diabetes mellitus with diabetic neuropathy, unspecified: Secondary | ICD-10-CM | POA: Diagnosis not present

## 2023-11-29 DIAGNOSIS — Z2821 Immunization not carried out because of patient refusal: Secondary | ICD-10-CM | POA: Diagnosis not present

## 2023-11-29 DIAGNOSIS — E1169 Type 2 diabetes mellitus with other specified complication: Secondary | ICD-10-CM | POA: Diagnosis not present

## 2023-11-29 DIAGNOSIS — I152 Hypertension secondary to endocrine disorders: Secondary | ICD-10-CM

## 2023-11-29 DIAGNOSIS — E119 Type 2 diabetes mellitus without complications: Secondary | ICD-10-CM

## 2023-11-29 DIAGNOSIS — R7989 Other specified abnormal findings of blood chemistry: Secondary | ICD-10-CM

## 2023-11-29 DIAGNOSIS — E785 Hyperlipidemia, unspecified: Secondary | ICD-10-CM

## 2023-11-29 DIAGNOSIS — N5201 Erectile dysfunction due to arterial insufficiency: Secondary | ICD-10-CM

## 2023-11-29 LAB — BAYER DCA HB A1C WAIVED: HB A1C (BAYER DCA - WAIVED): 6.4 % — ABNORMAL HIGH (ref 4.8–5.6)

## 2023-11-29 NOTE — Assessment & Plan Note (Signed)
 Refer to diabetes with neuropathy plan of care

## 2023-11-29 NOTE — Progress Notes (Signed)
 BP (!) 112/56   Pulse (!) 58   Temp 98.7 F (37.1 C) (Oral)   Ht 5' 6 (1.676 m)   Wt 179 lb 6.4 oz (81.4 kg)   SpO2 97%   BMI 28.96 kg/m    Subjective:    Patient ID: Lawrence Jensen, male    DOB: [birth year], 73 y.o.   MRN: [id number]  HPI: Lawrence Jensen is a 73 y.o. male presenting on 11/29/2023 for comprehensive medical examination. Current medical complaints include: none  He currently lives with: wife Interim Problems from his last visit: no  DIABETES [place] A1c was 5.9%.  Continues to take Metformin  twice a day.  Follows with urology for low testosterone  levels and ED, last visit was 10/14/23.  Continues on supplementation. Takes magnesium supplement daily. Has underlying neuropathy, last 10/11/23. Hypoglycemic episodes:no Polydipsia/polyuria: no Visual disturbance: no Chest pain: no Paresthesias: yes Glucose Monitoring: yes  Accucheck frequency: Daily  Fasting glucose: 130 range on average, occasional 140  Post prandial:  Evening:  Before meals: Taking Insulin?: no  Long acting insulin:  Short acting insulin: Blood Pressure Monitoring: a few times a week Retinal Examination: Up to Date -- [facility] Foot Exam: Up to date Pneumovax: Refuses Influenza: Refuses Aspirin: yes  HYPERTENSION / HYPERLIPIDEMIA Takes Amlodipine  and Lisinopril  + Crestor . Satisfied with current treatment? yes Duration of hypertension: chronic BP monitoring frequency: daily BP range: 110-120/70 BP medication side effects: no Duration of hyperlipidemia: years Cholesterol medication side effects: no Cholesterol supplements: none Medication compliance: good compliance Aspirin: no Recent stressors: no Recurrent headaches: no Visual changes: no Palpitations: no Dyspnea: no Chest pain: no Lower extremity edema: no Dizzy/lightheaded: no   Functional Status Survey: Is the patient deaf or have difficulty hearing?: No Does the patient have difficulty seeing, even when wearing  glasses/contacts?: No Does the patient have difficulty concentrating, remembering, or making decisions?: No Does the patient have difficulty walking or climbing stairs?: No Does the patient have difficulty dressing or bathing?: No Does the patient have difficulty doing errands alone such as visiting a doctor's office or shopping?: No  FALL RISK:    06/01/2023    8:12 AM 03/09/2023   11:28 AM 10/28/2022    8:39 AM 07/09/2022    9:57 AM 02/17/2022   10:41 AM  Fall Risk   Falls in the past year? 0 1 0 0 0  Number falls in past yr:  1  0 0  Injury with Fall?  0 0 0 0  Risk for fall due to :  History of fall(s);Impaired balance/gait;Impaired mobility;Orthopedic patient No Fall Risks No Fall Risks   Follow up  Falls prevention discussed;Education provided;Falls evaluation completed Falls evaluation completed Falls evaluation completed Falls evaluation completed;Education provided;Falls prevention discussed      Data saved with a previous flowsheet row definition    Depression Screen    11/29/2023    8:08 AM 06/01/2023    8:12 AM 04/30/2023    8:14 AM 03/09/2023   11:24 AM 10/28/2022    8:39 AM  Depression screen PHQ 2/9  Decreased Interest 0 0 0 0 0  Down, Depressed, Hopeless 0 0 0 0 0  PHQ - 2 Score 0 0 0 0 0  Altered sleeping 0  0 0 0  Tired, decreased energy 3  3 3 3   Change in appetite 0  0 0 0  Feeling bad or failure about yourself  0  0 0 0  Trouble concentrating 0  0 0  0  Moving slowly or fidgety/restless 0  0 0 0  Suicidal thoughts 0  0 0 0  PHQ-9 Score 3  3 3 3   Difficult doing work/chores Somewhat difficult  Very difficult Somewhat difficult Extremely dIfficult      11/29/2023    8:11 AM 04/30/2023    8:13 AM 04/30/2023    8:12 AM 10/28/2022    8:39 AM  GAD 7 : Generalized Anxiety Score  Nervous, Anxious, on Edge 0 0 0 0  Control/stop worrying 0 0 0 0  Worry too much - different things 0 0 0 0  Trouble relaxing 0 0 0 0  Restless 0 0 0 3  Easily annoyed or irritable 0  0 0 2  Afraid - awful might happen 0 0 0 0  Total GAD 7 Score 0 0 0 5  Anxiety Difficulty Not difficult at all Not difficult at all Not difficult at all Not difficult at all   Past Medical History:  Past Medical History:  Diagnosis Date   Diabetes mellitus without complication (HCC)    ED (erectile dysfunction)    Hyperlipidemia    Hypertension     Surgical History:  Past Surgical History:  Procedure Laterality Date   APPENDECTOMY  04/13/1997   TOOTH EXTRACTION      Medications:  Current Outpatient Medications on File Prior to Visit  Medication Sig   AMBULATORY NON FORMULARY MEDICATION Trimix (30/1/50)-(Pap/Phent/PGE)  Test Dose  1ml vial   Qty #3 Refills 0  [facility] [phone] Fax [phone]   AMBULATORY NON FORMULARY MEDICATION Quadmix (15/0.5/5/0.2)-(Pap/Phent/PGE/atropine)  Test Dose  1ml vial   Qty #3 Refills 0  [facility] [phone] Fax [phone]   amLODipine  (NORVASC ) 5 MG tablet Take 1 tablet (5 mg total) by mouth daily.   aspirin EC 81 MG tablet Take 81 mg by mouth daily.   Blood Glucose Calibration (OT ULTRA/FASTTK CNTRL SOLN) SOLN    Blood Glucose Monitoring Suppl (ONE TOUCH ULTRA 2) w/Device KIT Inject 1 kit into the skin 3 (three) times daily.   COD LIVER OIL PO Take 2 tablets by mouth daily.   glucose blood (ACCU-CHEK AVIVA PLUS) test strip 1 each by Other route 2 (two) times daily.   lisinopril  (ZESTRIL ) 20 MG tablet Take 1 tablet (20 mg total) by mouth daily.   Magnesium 400 MG TABS Take 400 mg by mouth 2 (two) times daily.   meloxicam  (MOBIC ) 15 MG tablet Take 1 tablet by mouth once daily   metFORMIN  (GLUCOPHAGE ) 1000 MG tablet Take 1 tablet (1,000 mg total) by mouth 2 (two) times daily with a meal.   NEEDLE, DISP, 21 G (BD DISP NEEDLES) 21G X 1-1/2 MISC 1 mg by Does not apply route once a week.   rosuvastatin  (CRESTOR ) 20 MG tablet Take 1 tablet (20 mg total) by mouth daily.   Syringe, Disposable, (2-3CC SYRINGE)  3 ML MISC 1 mg by Does not apply route once a week.   tadalafil  (CIALIS ) 5 MG tablet Take 1 tablet (5 mg total) by mouth daily as needed for erectile dysfunction.   testosterone  cypionate (DEPOTESTOSTERONE CYPIONATE) 200 MG/ML injection INJECT 1/2 (ONE-HALF) ML INTRAMUSCULARLY  ONCE A WEEK   No current facility-administered medications on file prior to visit.    Allergies:  No Known Allergies  Social History:  Social History   Socioeconomic History   Marital status: Married    Spouse name: Berwyn   Number of children: 4   Years of education:  Not on file   Highest education level: Associate degree: academic program  Occupational History   Occupation: retired  Tobacco Use   Smoking status: Former    Current packs/day: 0.00    Average packs/day: 1 pack/day for 35.0 years (35.0 ttl pk-yrs)    Types: Cigarettes    Start date: 04/02/1960    Quit date: 04/03/1995    Years since quitting: 28.6   Smokeless tobacco: Never  Vaping Use   Vaping status: Never Used  Substance and Sexual Activity   Alcohol use: Yes    Alcohol/week: 4.0 - 5.0 standard drinks of alcohol    Types: 4 - 5 Cans of beer per week    Comment: 1 beer 4-5 times per week   Drug use: No   Sexual activity: Not Currently  Other Topics Concern   Not on file  Social History Narrative   Not on file   Social Drivers of Health   Financial Resource Strain: Low Risk  (10/11/2023)   Received from [facility]   Overall Financial Resource Strain (CARDIA)    Difficulty of Paying Living Expenses: Not very hard  Food Insecurity: No Food Insecurity (10/11/2023)   Received from [facility]   Hunger Vital Sign    Within the past 12 months, you worried that your food would run out before you got the money to buy more.: Never true    Within the past 12 months, the food you bought just didn't last and you didn't have money to get more.: Never true  Transportation Needs: No Transportation  Needs (10/11/2023)   Received from [facility] - Transportation    In the past 12 months, has lack of transportation kept you from medical appointments or from getting medications?: No    Lack of Transportation (Non-Medical): No  Physical Activity: Inactive (03/09/2023)   Exercise Vital Sign    Days of Exercise per Week: 0 days    Minutes of Exercise per Session: 0 min  Stress: No Stress Concern Present (03/09/2023)   [employer] of Occupational Health - Occupational Stress Questionnaire    Feeling of Stress : Not at all  Social Connections: Moderately Isolated (03/09/2023)   Social Connection and Isolation Panel    Frequency of Communication with Friends and Family: More than three times a week    Frequency of Social Gatherings with Friends and Family: More than three times a week    Attends Religious Services: Never    Database administrator or Organizations: No    Attends Banker Meetings: Never    Marital Status: Married  Catering manager Violence: Not At Risk (03/09/2023)   Humiliation, Afraid, Rape, and Kick questionnaire    Fear of Current or Ex-Partner: No    Emotionally Abused: No    Physically Abused: No    Sexually Abused: No   Social History   Tobacco Use  Smoking Status Former   Current packs/day: 0.00   Average packs/day: 1 pack/day for 35.0 years (35.0 ttl pk-yrs)   Types: Cigarettes   Start date: 04/02/1960   Quit date: 04/03/1995   Years since quitting: 28.6  Smokeless Tobacco Never   Social History   Substance and Sexual Activity  Alcohol Use Yes   Alcohol/week: 4.0 - 5.0 standard drinks of alcohol   Types: 4 - 5 Cans of beer per week   Comment: 1 beer 4-5 times per week    Family History:  Family History  Problem Relation Age of Onset   Heart disease Mother    Heart disease Father    Congestive Heart Failure Father    Cancer Father        prostate   Diabetes Father    Stroke Father     Hypertension Father    Cancer Brother    Stroke Maternal Grandmother    Heart disease Brother    COPD Neg Hx     Past medical history, surgical history, medications, allergies, family history and social history reviewed with patient today and changes made to appropriate areas of the chart.   ROS All other ROS negative except what is listed above and in the HPI.      Objective:    BP (!) 112/56   Pulse (!) 58   Temp 98.7 F (37.1 C) (Oral)   Ht 5' 6 (1.676 m)   Wt 179 lb 6.4 oz (81.4 kg)   SpO2 97%   BMI 28.96 kg/m   Wt Readings from Last 3 Encounters:  11/29/23 179 lb 6.4 oz (81.4 kg)  10/14/23 175 lb (79.4 kg)  09/21/23 178 lb 9.6 oz (81 kg)    Physical Exam Vitals and nursing note reviewed.  Constitutional:      General: He is awake. He is not in acute distress.    Appearance: He is well-developed and well-groomed. He is not ill-appearing or toxic-appearing.  HENT:     Head: Normocephalic and atraumatic.     Right Ear: Hearing, tympanic membrane, ear canal and external ear normal. No drainage.     Left Ear: Hearing, tympanic membrane, ear canal and external ear normal. No drainage.     Nose: Nose normal.     Mouth/Throat:     Pharynx: Uvula midline.  Eyes:     General: Lids are normal.        Right eye: No discharge.        Left eye: No discharge.     Extraocular Movements: Extraocular movements intact.     Conjunctiva/sclera: Conjunctivae normal.     Pupils: Pupils are equal, round, and reactive to light.     Visual Fields: Right eye visual fields normal and left eye visual fields normal.  Neck:     Thyroid : No thyromegaly.     Vascular: No carotid bruit or JVD.     Trachea: Trachea normal.  Cardiovascular:     Rate and Rhythm: Regular rhythm. Bradycardia present.     Heart sounds: Normal heart sounds, S1 normal and S2 normal. No murmur heard.    No gallop.  Pulmonary:     Effort: Pulmonary effort is normal. No accessory muscle usage or respiratory  distress.     Breath sounds: Normal breath sounds.  Abdominal:     General: Bowel sounds are normal.     Palpations: Abdomen is soft. There is no hepatomegaly or splenomegaly.     Tenderness: There is no abdominal tenderness.  Musculoskeletal:        General: Normal range of motion.     Cervical back: Normal range of motion and neck supple.     Right lower leg: No edema.     Left lower leg: No edema.  Lymphadenopathy:     Head:     Right side of head: No submental, submandibular, tonsillar, preauricular or posterior auricular adenopathy.     Left side of head: No submental, submandibular, tonsillar, preauricular or posterior auricular adenopathy.     Cervical:  No cervical adenopathy.  Skin:    General: Skin is warm and dry.     Capillary Refill: Capillary refill takes less than 2 seconds.     Findings: No rash.  Neurological:     Mental Status: He is alert and oriented to person, place, and time.     Gait: Gait is intact.     Deep Tendon Reflexes: Reflexes are normal and symmetric.     Reflex Scores:      Brachioradialis reflexes are 2+ on the right side and 2+ on the left side.      Patellar reflexes are 2+ on the right side and 2+ on the left side. Psychiatric:        Attention and Perception: Attention normal.        Mood and Affect: Mood normal.        Speech: Speech normal.        Behavior: Behavior normal. Behavior is cooperative.        Thought Content: Thought content normal.        Cognition and Memory: Cognition normal.    Diabetic Foot Exam - Simple   Simple Foot Form Visual Inspection See comments: Yes Sensation Testing See comments: Yes Pulse Check Posterior Tibialis and Dorsalis pulse intact bilaterally: Yes Comments Thick toenails, fungal disease bilaterally.  Bunions.  DP and PT pulses 2+.  Sensation 8/10 bilaterally.        03/09/2023   11:30 AM 02/17/2022   10:38 AM 12/06/2020    1:04 PM 10/03/2020    9:24 AM 12/04/2019   10:36 AM  6CIT Screen   What Year? 0 points 0 points 0 points 0 points 0 points  What month? 0 points 0 points 0 points 0 points 0 points  What time? 0 points 0 points 0 points 0 points 0 points  Count back from 20 0 points 0 points 0 points 0 points 0 points  Months in reverse 0 points 0 points 0 points 0 points 0 points  Repeat phrase 0 points 2 points 4 points 0 points 0 points  Total Score 0 points 2 points 4 points 0 points 0 points   Results for orders placed or performed in visit on 10/14/23  Testosterone    Collection Time: 10/14/23  9:49 AM  Result Value Ref Range   Testosterone  268 264 - 916 ng/dL  Hemoglobin and hematocrit, blood   Collection Time: 10/14/23  9:49 AM  Result Value Ref Range   Hemoglobin 16.8 13.0 - 17.7 g/dL   Hematocrit 49.8 62.4 - 51.0 %      Assessment & Plan:   Problem List Items Addressed This Visit       Cardiovascular and Mediastinum   Hypertension associated with diabetes (HCC)   Chronic, stable.  BP well below goal in office and at home. Continue Lisinopril  20 MG and Amlodipine  5 MG daily. Recommend continue to use Meloxicam  sparingly.  Monitor BP at home daily and focus on DASH diet.  Labs today: CBC, TSH, CMP.  Urine ALB 150 January 2025.         Relevant Orders   Bayer DCA Hb A1c Waived   CBC with Differential/Platelet   TSH   Erectile dysfunction due to arterial insufficiency   Stable.  Continue good control T2DM and HTN.  Continue collaboration with urology.        Endocrine   Type 2 diabetes mellitus with diabetic neuropathy, without long-term current use of insulin (HCC) - Primary  Chronic, ongoing.  A1c 5.9% on last check, recheck today. Often is in 6 range. Urine ALB 150 January 2025, will continue Lisinopril  for kidney protection.  Continue current medication regimen and adjust as needed.  Recommend continued focus on daily exercise, which he has started again, and healthy diet.  Could consider addition of Farxiga or Jardiance if elevations in  future. - Refuses all vaccinations, have discussed multiple times with patient. - Foot and eye exams up to date. - ACE and Statin on board. - Return to neurology as needed for neuropathy      Relevant Orders   Bayer DCA Hb A1c Waived   CBC with Differential/Platelet   TSH   Magnesium   Hyperlipidemia associated with type 2 diabetes mellitus (HCC)   Chronic, ongoing.  Continue current medication regimen and adjust as needed.  Lipid panel today.      Relevant Orders   Bayer DCA Hb A1c Waived   Comprehensive metabolic panel with GFR   Lipid Panel w/o Chol/HDL Ratio   Diabetes mellitus treated with oral medication (HCC)   Refer to diabetes with neuropathy plan of care.      Relevant Orders   Bayer DCA Hb A1c Waived     Other   Low testosterone  in male   Continue collaboration with urology and current treatment as ordered by them.  Recent notes and labs reviewed.      Flu vaccine refused   Refuses all vaccinations, have discussed multiple times.      Other Visit Diagnoses       Encounter for annual physical exam       Annual physical today with labs and health maintenance reviewed, discussed with patient.       Discussed aspirin prophylaxis for myocardial infarction prevention and decision was made to continue ASA  LABORATORY TESTING:  Health maintenance labs ordered today as discussed above.   The natural history of prostate cancer and ongoing controversy regarding screening and potential treatment outcomes of prostate cancer has been discussed with the patient. The meaning of a false positive PSA and a false negative PSA has been discussed. He indicates understanding of the limitations of this screening test and wishes to proceed with screening PSA testing.  Urology has been monitoring this.  Refuses all vaccinations, have had multiple discussions with him on this.  IMMUNIZATIONS:   - Tdap: Tetanus vaccination status reviewed: last tetanus booster within 10  years. - Influenza: Refused - Pneumovax: Refused - Prevnar: Refused - Zostavax vaccine: Refused  SCREENING: - Colonoscopy: Cologuard due next 02/19/2025 Discussed with patient purpose of the colonoscopy is to detect colon cancer at curable precancerous or early stages   - AAA Screening: Completed 11/03/22 - no AAA -Hearing Test: Not applicable  -Spirometry: Not applicable   PATIENT COUNSELING:    Sexuality: Discussed sexually transmitted diseases, partner selection, use of condoms, avoidance of unintended pregnancy  and contraceptive alternatives.   Advised to avoid cigarette smoking.  I discussed with the patient that most people either abstain from alcohol or drink within safe limits (<=14/week and <=4 drinks/occasion for males, <=7/weeks and <= 3 drinks/occasion for females) and that the risk for alcohol disorders and other health effects rises proportionally with the number of drinks per week and how often a drinker exceeds daily limits.  Discussed cessation/primary prevention of drug use and availability of treatment for abuse.   Diet: Encouraged to adjust caloric intake to maintain  or achieve ideal body weight, to reduce intake of dietary  saturated fat and total fat, to limit sodium intake by avoiding high sodium foods and not adding table salt, and to maintain adequate dietary potassium and calcium  preferably from fresh fruits, vegetables, and low-fat dairy products.    Stressed the importance of regular exercise  Injury prevention: Discussed safety belts, safety helmets, smoke detector, smoking near bedding or upholstery.   Dental health: Discussed importance of regular tooth brushing, flossing, and dental visits.   Follow up plan: NEXT PREVENTATIVE PHYSICAL DUE IN 1 YEAR. Return in about 6 months (around 05/31/2024) for T2DM, HTN/HLD.

## 2023-11-29 NOTE — Assessment & Plan Note (Signed)
 Chronic, ongoing.  Continue current medication regimen and adjust as needed. Lipid panel today.

## 2023-11-29 NOTE — Assessment & Plan Note (Signed)
 Refuses all vaccinations, have discussed multiple times.

## 2023-11-29 NOTE — Assessment & Plan Note (Signed)
Continue collaboration with urology and current treatment as ordered by them.  Recent notes and labs reviewed.

## 2023-11-29 NOTE — Assessment & Plan Note (Signed)
 Chronic, ongoing.  A1c 5.9% on last check, recheck today. Often is in 6 range. Urine ALB 150 January 2025, will continue Lisinopril  for kidney protection.  Continue current medication regimen and adjust as needed.  Recommend continued focus on daily exercise, which he has started again, and healthy diet.  Could consider addition of Farxiga or Jardiance if elevations in future. - Refuses all vaccinations, have discussed multiple times with patient. - Foot and eye exams up to date. - ACE and Statin on board. - Return to neurology as needed for neuropathy

## 2023-11-29 NOTE — Assessment & Plan Note (Signed)
 Chronic, stable.  BP well below goal in office and at home. Continue Lisinopril  20 MG and Amlodipine  5 MG daily. Recommend continue to use Meloxicam  sparingly.  Monitor BP at home daily and focus on DASH diet.  Labs today: CBC, TSH, CMP.  Urine ALB 150 January 2025.

## 2023-11-29 NOTE — Assessment & Plan Note (Addendum)
 Stable.  Continue good control T2DM and HTN.  Continue collaboration with urology.

## 2023-11-30 ENCOUNTER — Ambulatory Visit: Payer: Self-pay | Admitting: Nurse Practitioner

## 2023-11-30 LAB — COMPREHENSIVE METABOLIC PANEL WITH GFR
ALT: 12 IU/L (ref 0–44)
AST: 16 IU/L (ref 0–40)
Albumin: 4.4 g/dL (ref 3.8–4.8)
Alkaline Phosphatase: 96 IU/L (ref 44–121)
BUN/Creatinine Ratio: 12 (ref 10–24)
BUN: 13 mg/dL (ref 8–27)
Bilirubin Total: 0.5 mg/dL (ref 0.0–1.2)
CO2: 24 mmol/L (ref 20–29)
Calcium: 9.7 mg/dL (ref 8.6–10.2)
Chloride: 102 mmol/L (ref 96–106)
Creatinine, Ser: 1.13 mg/dL (ref 0.76–1.27)
Globulin, Total: 2.3 g/dL (ref 1.5–4.5)
Glucose: 150 mg/dL — ABNORMAL HIGH (ref 70–99)
Potassium: 4.8 mmol/L (ref 3.5–5.2)
Sodium: 141 mmol/L (ref 134–144)
Total Protein: 6.7 g/dL (ref 6.0–8.5)
eGFR: 69 mL/min/1.73 (ref >59)

## 2023-11-30 LAB — MAGNESIUM: Magnesium: 2.1 mg/dL (ref 1.6–2.3)

## 2023-11-30 LAB — CBC WITH DIFFERENTIAL/PLATELET
Basophils Absolute: 0 x10E3/uL (ref 0.0–0.2)
Basos: 1 %
EOS (ABSOLUTE): 0.1 x10E3/uL (ref 0.0–0.4)
Eos: 3 %
Hematocrit: 46.4 % (ref 37.5–51.0)
Hemoglobin: 15.1 g/dL (ref 13.0–17.7)
Immature Grans (Abs): 0 x10E3/uL (ref 0.0–0.1)
Immature Granulocytes: 0 %
Lymphocytes Absolute: 1.1 x10E3/uL (ref 0.7–3.1)
Lymphs: 28 %
MCH: 31.2 pg (ref 26.6–33.0)
MCHC: 32.5 g/dL (ref 31.5–35.7)
MCV: 96 fL (ref 79–97)
Monocytes Absolute: 0.3 x10E3/uL (ref 0.1–0.9)
Monocytes: 9 %
Neutrophils Absolute: 2.3 x10E3/uL (ref 1.4–7.0)
Neutrophils: 59 %
Platelets: 186 x10E3/uL (ref 150–450)
RBC: 4.84 x10E6/uL (ref 4.14–5.80)
RDW: 12.6 % (ref 11.6–15.4)
WBC: 3.9 x10E3/uL (ref 3.4–10.8)

## 2023-11-30 LAB — LIPID PANEL W/O CHOL/HDL RATIO
Cholesterol, Total: 110 mg/dL (ref 100–199)
HDL: 31 mg/dL — ABNORMAL LOW (ref >39)
LDL Chol Calc (NIH): 58 mg/dL (ref 0–99)
Triglycerides: 111 mg/dL (ref 0–149)
VLDL Cholesterol Cal: 21 mg/dL (ref 5–40)

## 2023-11-30 LAB — TSH: TSH: 2.24 u[IU]/mL (ref 0.450–4.500)

## 2023-11-30 NOTE — Progress Notes (Signed)
 Good morning, please let [name] know all labs have returned and continue to look good.  No changes needed.  Keep up the great work.  Any questions? Keep being stellar!!  Thank you for allowing me to participate in your care.  I appreciate you. Kindest regards, Uldine Fuster

## 2023-12-31 ENCOUNTER — Ambulatory Visit: Admitting: Dietician

## 2024-01-14 ENCOUNTER — Ambulatory Visit: Admitting: Urology

## 2024-02-08 ENCOUNTER — Other Ambulatory Visit: Payer: Self-pay

## 2024-02-08 DIAGNOSIS — N138 Other obstructive and reflux uropathy: Secondary | ICD-10-CM

## 2024-02-09 ENCOUNTER — Other Ambulatory Visit

## 2024-02-09 DIAGNOSIS — N138 Other obstructive and reflux uropathy: Secondary | ICD-10-CM

## 2024-02-09 DIAGNOSIS — E291 Testicular hypofunction: Secondary | ICD-10-CM

## 2024-02-10 ENCOUNTER — Ambulatory Visit: Payer: Self-pay | Admitting: Urology

## 2024-02-10 LAB — TESTOSTERONE: Testosterone: 352 ng/dL (ref 264–916)

## 2024-02-10 LAB — HEMOGLOBIN AND HEMATOCRIT, BLOOD
Hematocrit: 44.2 % (ref 37.5–51.0)
Hemoglobin: 15.2 g/dL (ref 13.0–17.7)

## 2024-02-10 LAB — PSA: Prostate Specific Ag, Serum: 0.3 ng/mL (ref 0.0–4.0)

## 2024-02-10 NOTE — Progress Notes (Signed)
 Called patient and patient understood got both appts scheduled

## 2024-02-16 ENCOUNTER — Other Ambulatory Visit: Payer: Self-pay | Admitting: Nurse Practitioner

## 2024-02-17 NOTE — Telephone Encounter (Signed)
 Requested Prescriptions  Pending Prescriptions Disp Refills   meloxicam  (MOBIC ) 15 MG tablet [Pharmacy Med Name: Meloxicam  15 MG Oral Tablet] 90 tablet 0    Sig: Take 1 tablet by mouth once daily     Analgesics:  COX2 Inhibitors Failed - 02/17/2024  4:24 PM      Failed - Manual Review: Labs are only required if the patient has taken medication for more than 8 weeks.      Passed - HGB in normal range and within 360 days    Hemoglobin  Date Value Ref Range Status  02/09/2024 15.2 13.0 - 17.7 g/dL Final         Passed - Cr in normal range and within 360 days    Creatinine, Ser  Date Value Ref Range Status  11/29/2023 1.13 0.76 - 1.27 mg/dL Final         Passed - HCT in normal range and within 360 days    Hematocrit  Date Value Ref Range Status  02/09/2024 44.2 37.5 - 51.0 % Final         Passed - AST in normal range and within 360 days    AST  Date Value Ref Range Status  11/29/2023 16 0 - 40 IU/L Final   AST (SGOT) Piccolo, Waived  Date Value Ref Range Status  03/01/2018 26 11 - 38 U/L Final         Passed - ALT in normal range and within 360 days    ALT  Date Value Ref Range Status  11/29/2023 12 0 - 44 IU/L Final   ALT (SGPT) Piccolo, Waived  Date Value Ref Range Status  03/01/2018 27 10 - 47 U/L Final         Passed - eGFR is 30 or above and within 360 days    GFR calc Af Amer  Date Value Ref Range Status  03/28/2020 72 >59 mL/min/1.73 Final    Comment:    **In accordance with recommendations from the NKF-ASN Task force,**   [facility] is in the process of updating its eGFR calculation to the   2021 CKD-EPI creatinine equation that estimates kidney function   without a race variable.    GFR calc non Af Amer  Date Value Ref Range Status  03/28/2020 62 >59 mL/min/1.73 Final   eGFR  Date Value Ref Range Status  11/29/2023 69 >59 mL/min/1.73 Final         Passed - Patient is not pregnant      Passed - Valid encounter within last 12 months    Recent  Outpatient Visits           2 months ago Type 2 diabetes mellitus with diabetic neuropathy, without long-term current use of insulin (HCC)   [facility] [place], [place] T, NP   4 months ago Type 2 diabetes mellitus with diabetic neuropathy, without long-term current use of insulin (HCC)    [facility] [place], Melanie DASEN, NP       Future Appointments             In 2 months McGowan, Clotilda DELENA RIGGERS [facility] [place]

## 2024-02-21 ENCOUNTER — Other Ambulatory Visit: Payer: Self-pay | Admitting: Urology

## 2024-02-21 DIAGNOSIS — E349 Endocrine disorder, unspecified: Secondary | ICD-10-CM

## 2024-03-15 LAB — HM DIABETES EYE EXAM

## 2024-03-20 ENCOUNTER — Encounter: Payer: Self-pay | Admitting: Nurse Practitioner

## 2024-04-17 DIAGNOSIS — E291 Testicular hypofunction: Secondary | ICD-10-CM

## 2024-04-19 ENCOUNTER — Other Ambulatory Visit

## 2024-04-19 DIAGNOSIS — E291 Testicular hypofunction: Secondary | ICD-10-CM

## 2024-04-20 LAB — TESTOSTERONE: Testosterone: 403 ng/dL (ref 264–916)

## 2024-04-20 LAB — HEMOGLOBIN AND HEMATOCRIT, BLOOD
Hematocrit: 44.6 % (ref 37.5–51.0)
Hemoglobin: 14.8 g/dL (ref 13.0–17.7)

## 2024-04-24 NOTE — Progress Notes (Unsigned)
 "    04/26/2024 9:08 AM   Lawrence Jensen May 06, 1950 [id number]  Referring provider: Valerio Melanie DASEN, NP [address] [place],  [place] [address]  Urological history: 1. Hypogonadism - Testosterone  level (04/2024) 403 - Hemoglobin/hematocrit (04/2024) 14.8/44.6 - Testosterone  cypionate 200 mg/mL, 0.5 cc every 10 days   2. ED - Failed PDE 5 inhibitors and VED   3. BPH with LU TS  - PSA (07/2023) 0.4   4.  Family history of prostate cancer - Father diagnosed with prostate cancer at age 74  5. Peyronie's Disease - dorsal surface of penis  Chief Complaint  Patient presents with   Erectile Dysfunction   HPI: Lawrence Jensen is a 74 y.o. man who presents today for follow up.   Previous records reviewed.  He reports good adherence to testosterone  cypionate 200 mg/mL, 0.5 cc every 10 days.  Denies new complaints of low libido, erectile dysfunction, fatigue, or mood changes.  No complaints of gynecomastia, visual changes, or thromboembolic symptoms.  Energy level, libido and overall sense of wellbeing being reported as stable/ improved compared to prior visit.  Testosterone  levels are therapeutic.  Hemoglobin and hematocrit are normal.  I PSS 15/3  He reports urinary frequency, urinary intermittency, urinary urgency, a weak urinary stream, nocturia x 1, and leaking before being able to reach the restroom.    Patient denies any modifying or aggravating factors.  Patient denies any recent UTI's, gross hematuria, dysuria or suprapubic/flank pain.  Patient denies any fevers, chills, nausea or vomiting.    He was started on tamsulosin 0.4 mg daily by his PCP about one month ago.    He has a family history of PCa.      PSA (01/2024) 0.3  Serum creatinine (11/2023) 1.13  Hemoglobin A1c (11/2023) 6.4   UA yellow clear, specific amity 1.010, pH 5.5, trace ketone, trace leukocyte, 0-5 WBCs, 0-2 RBCs and 0-2 epithelial cells.    PVR 134 mL  SHIM 20  He does not have  confidence that he could get and keep an erection, his erections are not firm enough for penetrative intercourse, he has difficulty maintaining his erections, and he is not finding intercourse satisfactory for him.    Patient is not having spontaneous erections.  He denies any pain or curvature with erections.    PMH: Past Medical History:  Diagnosis Date   Diabetes mellitus without complication [facility])    ED (erectile dysfunction)    Hyperlipidemia    Hypertension     Surgical History: Past Surgical History:  Procedure Laterality Date   APPENDECTOMY  04/13/1997   TOOTH EXTRACTION      Home Medications:  Allergies as of 04/26/2024   No Known Allergies      Medication List        Accurate as of April 26, 2024  9:08 AM. If you have any questions, ask your nurse or doctor.          2-3CC SYRINGE 3 ML Misc 1 mg by Does not apply route once a week.   Accu-Chek Aviva Plus test strip Generic drug: glucose blood 1 each by Other route 2 (two) times daily.   AMBULATORY NON FORMULARY MEDICATION Trimix (30/1/50)-(Pap/Phent/PGE)  Test Dose  1ml vial   Qty #3 Refills 0  [facility] [phone] Fax [phone]   AMBULATORY NON FORMULARY MEDICATION Quadmix (15/0.5/5/0.2)-(Pap/Phent/PGE/atropine)  Test Dose  1ml vial   Qty #3 Refills 0  [facility] [phone] Fax [phone]  amLODipine  5 MG tablet Commonly known as: NORVASC  Take 1 tablet (5 mg total) by mouth daily.   aspirin EC 81 MG tablet Take 81 mg by mouth daily.   BD Disp Needles 21G X 1-1/2 Misc Generic drug: NEEDLE (DISP) 21 G 1 mg by Does not apply route once a week.   COD LIVER OIL PO Take 2 tablets by mouth daily.   lisinopril  20 MG tablet Commonly known as: ZESTRIL  Take 1 tablet (20 mg total) by mouth daily.   Magnesium 400 MG Tabs Take 400 mg by mouth 2 (two) times daily.   meloxicam  15 MG tablet Commonly known as: MOBIC  Take 1 tablet by mouth once daily    metFORMIN  1000 MG tablet Commonly known as: GLUCOPHAGE  Take 1 tablet (1,000 mg total) by mouth 2 (two) times daily with a meal.   ONE TOUCH ULTRA 2 w/Device Kit Inject 1 kit into the skin 3 (three) times daily.   OT ULTRA/FASTTK CNTRL SOLN Soln   rosuvastatin  20 MG tablet Commonly known as: CRESTOR  Take 1 tablet (20 mg total) by mouth daily.   tadalafil  5 MG tablet Commonly known as: CIALIS  Take 1 tablet (5 mg total) by mouth daily as needed for erectile dysfunction.   tamsulosin 0.4 MG Caps capsule Commonly known as: FLOMAX Take 0.4 mg by mouth daily.   testosterone  cypionate 200 MG/ML injection Commonly known as: DEPOTESTOSTERONE CYPIONATE INJECT 0.5ML INTRAMUSCULARLY ONCE A WEEK        Allergies: Allergies[1]  Family History: Family History  Problem Relation Age of Onset   Heart disease Mother    Heart disease Father    Congestive Heart Failure Father    Cancer Father        prostate   Diabetes Father    Stroke Father    Hypertension Father    Cancer Brother    Stroke Maternal Grandmother    Heart disease Brother    COPD Neg Hx     Social History:  reports that he quit smoking about 29 years ago. His smoking use included cigarettes. He started smoking about 64 years ago. He has a 35 pack-year smoking history. He has never used smokeless tobacco. He reports current alcohol use of about 4.0 - 5.0 standard drinks of alcohol per week. He reports that he does not use drugs.  ROS: Pertinent ROS in HPI  Physical Exam: BP 126/76   Pulse 67   Wt 150 lb (68 kg)   SpO2 98%   BMI 24.21 kg/m   Constitutional:  Well nourished. Alert and oriented, No acute distress. HEENT: [place] AT, moist mucus membranes.  Trachea midline Cardiovascular: No clubbing, cyanosis, or edema. Respiratory: Normal respiratory effort, no increased work of breathing. Lymph: No cervical or inguinal adenopathy. Neurologic: Grossly intact, no focal deficits, moving all 4  extremities. Psychiatric: Normal mood and affect.  Laboratory Data: See Epic and HPI   I have reviewed the labs.   Pertinent Imaging: N/A  Assessment & Plan:    1. Hypogonadism  -testosterone  levels are subtherapeutic  -hemoglobin/hematocrit normal -continue testosterone  cypionate 100 mg/milliliters, 0.5 cc every 10 days  2. BPH with LUTS -PSA stable -UA benign  -PVR < 300 cc  -most bothersome symptoms are urge incontinence -continue conservative management, avoiding bladder irritants and timed voiding's -Continue tamsulosin 0.4 mg daily  3. Erectile dysfunction:    - he was not able to fill the tadalafil  5 mg daily for unknown reasons, so he was not able to try it  with the [facility] - I have sent in the medication again and will follow up  - if he is able to fill the tadalafil , he will start taking that daily and then use the Quad Mix to see if he gets better results   Return for pending Cialis  PA .  These notes generated with voice recognition software. I apologize for typographical errors.  CLOTILDA CORNWALL, PA-C  [facility] [address]  [address] [place], [place] [address] [phone]      [1] No Known Allergies  "

## 2024-04-26 ENCOUNTER — Ambulatory Visit: Admitting: Urology

## 2024-04-26 ENCOUNTER — Encounter: Payer: Self-pay | Admitting: Urology

## 2024-04-26 VITALS — BP 126/76 | HR 67 | Wt 150.0 lb

## 2024-04-26 DIAGNOSIS — E291 Testicular hypofunction: Secondary | ICD-10-CM | POA: Diagnosis not present

## 2024-04-26 DIAGNOSIS — N401 Enlarged prostate with lower urinary tract symptoms: Secondary | ICD-10-CM | POA: Diagnosis not present

## 2024-04-26 DIAGNOSIS — E349 Endocrine disorder, unspecified: Secondary | ICD-10-CM | POA: Diagnosis not present

## 2024-04-26 DIAGNOSIS — N529 Male erectile dysfunction, unspecified: Secondary | ICD-10-CM | POA: Diagnosis not present

## 2024-04-26 DIAGNOSIS — N138 Other obstructive and reflux uropathy: Secondary | ICD-10-CM | POA: Diagnosis not present

## 2024-04-26 LAB — URINALYSIS, COMPLETE
Bilirubin, UA: NEGATIVE
Glucose, UA: NEGATIVE
Nitrite, UA: NEGATIVE
Protein,UA: NEGATIVE
RBC, UA: NEGATIVE
Specific Gravity, UA: 1.01 (ref 1.005–1.030)
Urobilinogen, Ur: 0.2 mg/dL (ref 0.2–1.0)
pH, UA: 5.5 (ref 5.0–7.5)

## 2024-04-26 LAB — MICROSCOPIC EXAMINATION: Bacteria, UA: NONE SEEN

## 2024-04-26 LAB — BLADDER SCAN AMB NON-IMAGING

## 2024-04-26 MED ORDER — TESTOSTERONE CYPIONATE 200 MG/ML IM SOLN: INTRAMUSCULAR | 0 refills | Status: AC

## 2024-04-26 MED ORDER — TADALAFIL 5 MG PO TABS: 5.0000 mg | ORAL_TABLET | Freq: Every day | ORAL | 11 refills | Status: AC | PRN

## 2024-06-02 ENCOUNTER — Ambulatory Visit: Admitting: Nurse Practitioner

## 2024-06-15 ENCOUNTER — Ambulatory Visit
